# Patient Record
Sex: Female | Born: 1950
Health system: Southern US, Community
[De-identification: ages and names within clinical notes are randomized; demographics above are authoritative.]

## PROBLEM LIST (undated history)

## (undated) DIAGNOSIS — N189 Chronic kidney disease, unspecified: Secondary | ICD-10-CM

## (undated) DIAGNOSIS — D494 Neoplasm of unspecified behavior of bladder: Secondary | ICD-10-CM

## (undated) DIAGNOSIS — M47816 Spondylosis without myelopathy or radiculopathy, lumbar region: Secondary | ICD-10-CM

## (undated) DIAGNOSIS — Z87440 Personal history of urinary (tract) infections: Secondary | ICD-10-CM

## (undated) DIAGNOSIS — Z9889 Other specified postprocedural states: Secondary | ICD-10-CM

## (undated) DIAGNOSIS — Z923 Personal history of irradiation: Secondary | ICD-10-CM

## (undated) DIAGNOSIS — Z85828 Personal history of other malignant neoplasm of skin: Secondary | ICD-10-CM

## (undated) DIAGNOSIS — N3020 Other chronic cystitis without hematuria: Secondary | ICD-10-CM

## (undated) DIAGNOSIS — E890 Postprocedural hypothyroidism: Secondary | ICD-10-CM

## (undated) DIAGNOSIS — F3181 Bipolar II disorder: Secondary | ICD-10-CM

## (undated) DIAGNOSIS — Z87442 Personal history of urinary calculi: Secondary | ICD-10-CM

## (undated) HISTORY — PX: APPENDECTOMY: SHX54

## (undated) HISTORY — DX: Personal history of urinary (tract) infections: Z87.440

## (undated) HISTORY — DX: Spondylosis without myelopathy or radiculopathy, lumbar region: M47.816

## (undated) HISTORY — DX: Personal history of urinary calculi: Z87.442

## (undated) HISTORY — PX: JOINT REPLACEMENT: SHX530

---

## 1974-12-30 HISTORY — PX: TUBAL LIGATION: SHX77

## 2001-07-06 ENCOUNTER — Encounter (INDEPENDENT_AMBULATORY_CARE_PROVIDER_SITE_OTHER): Payer: Self-pay | Admitting: Specialist

## 2001-07-06 ENCOUNTER — Ambulatory Visit (HOSPITAL_BASED_OUTPATIENT_CLINIC_OR_DEPARTMENT_OTHER): Admission: RE | Admit: 2001-07-06 | Discharge: 2001-07-06 | Payer: Self-pay | Admitting: General Surgery

## 2003-10-24 ENCOUNTER — Encounter: Payer: Self-pay | Admitting: Radiology

## 2003-10-24 ENCOUNTER — Encounter: Admission: RE | Admit: 2003-10-24 | Discharge: 2003-10-24 | Payer: Self-pay

## 2003-11-09 ENCOUNTER — Encounter: Admission: RE | Admit: 2003-11-09 | Discharge: 2003-11-09 | Payer: Self-pay

## 2004-01-10 ENCOUNTER — Ambulatory Visit (HOSPITAL_COMMUNITY): Admission: RE | Admit: 2004-01-10 | Discharge: 2004-01-10 | Payer: Self-pay | Admitting: Neurosurgery

## 2008-03-02 ENCOUNTER — Inpatient Hospital Stay (HOSPITAL_COMMUNITY): Admission: EM | Admit: 2008-03-02 | Discharge: 2008-03-05 | Payer: Self-pay | Admitting: Emergency Medicine

## 2008-03-30 ENCOUNTER — Encounter: Admission: RE | Admit: 2008-03-30 | Discharge: 2008-03-30 | Payer: Self-pay | Admitting: Endocrinology

## 2008-05-30 ENCOUNTER — Ambulatory Visit (HOSPITAL_COMMUNITY): Admission: RE | Admit: 2008-05-30 | Discharge: 2008-05-30 | Payer: Self-pay | Admitting: Urology

## 2008-12-30 HISTORY — PX: EXTRACORPOREAL SHOCK WAVE LITHOTRIPSY: SHX1557

## 2010-04-26 ENCOUNTER — Encounter: Admission: RE | Admit: 2010-04-26 | Discharge: 2010-04-26 | Payer: Self-pay | Admitting: Endocrinology

## 2010-07-30 LAB — CONVERTED CEMR LAB: Pap Smear: NORMAL

## 2010-10-31 ENCOUNTER — Encounter: Payer: Self-pay | Admitting: Internal Medicine

## 2010-11-13 ENCOUNTER — Ambulatory Visit: Payer: Self-pay | Admitting: Internal Medicine

## 2010-11-13 DIAGNOSIS — E785 Hyperlipidemia, unspecified: Secondary | ICD-10-CM | POA: Insufficient documentation

## 2010-11-13 DIAGNOSIS — E669 Obesity, unspecified: Secondary | ICD-10-CM

## 2010-11-13 DIAGNOSIS — Z87442 Personal history of urinary calculi: Secondary | ICD-10-CM

## 2010-11-13 DIAGNOSIS — E039 Hypothyroidism, unspecified: Secondary | ICD-10-CM

## 2010-11-13 DIAGNOSIS — M1990 Unspecified osteoarthritis, unspecified site: Secondary | ICD-10-CM

## 2010-11-13 DIAGNOSIS — F4321 Adjustment disorder with depressed mood: Secondary | ICD-10-CM

## 2010-11-13 DIAGNOSIS — J309 Allergic rhinitis, unspecified: Secondary | ICD-10-CM | POA: Insufficient documentation

## 2010-11-13 DIAGNOSIS — N390 Urinary tract infection, site not specified: Secondary | ICD-10-CM

## 2010-11-26 ENCOUNTER — Encounter: Payer: Self-pay | Admitting: Internal Medicine

## 2011-01-20 ENCOUNTER — Encounter: Payer: Self-pay | Admitting: Endocrinology

## 2011-01-29 NOTE — Progress Notes (Signed)
Summary: Medication and Physician List provided by patient  Medication and Physician List provided by patient   Imported By: Maryln Gottron 11/20/2010 10:44:04  _____________________________________________________________________  External Attachment:    Type:   Image     Comment:   External Document

## 2011-01-29 NOTE — Letter (Signed)
Summary: Records from [facility] 2009 - 2011  Records from [facility] 2009 - 2011   Imported By: Maryln Gottron 12/05/2010 10:48:40  _____________________________________________________________________  External Attachment:    Type:   Image     Comment:   External Document

## 2011-01-29 NOTE — Letter (Signed)
Summary: PATIENT HX FORM  PATIENT RX FORM   Imported By: Georgian Co 11/26/2010 16:01:35  _____________________________________________________________________  External Attachment:    Type:   Image     Comment:   External Document

## 2011-01-29 NOTE — Assessment & Plan Note (Signed)
Summary: BRAND NEW PT/TO EST/CJR   Vital Signs:  Patient profile:   60 year old female Menstrual status:  postmenopausal Height:      62.25 inches Weight:      258 pounds BMI:     46.98 Pulse rate:   78 / minute BP sitting:   100 / 70  (left arm) Cuff size:   regular  Vitals Entered By: Romualdo Bolk, CMA (AAMA) (November 13, 2010 1:27 PM)  Nutrition Counseling: Patient's BMI is greater than 25 and therefore counseled on weight management options. CC: New Pt to get establish. Pt wants to discuss weight issue.     Menstrual Status postmenopausal Last PAP Result normal   History of Present Illness: Amber Gutierrez .comes in today   for new patient  visit  .  Her previous care  has bee Dr Juleen China.     she saw the practiitoner in his office  and she has since left.  She is most concerned about weight gain and  obesity .   She tends to mood eat  but  tobacco cessation and thyroid disease felt contribute. In early 90s  thyroid disease  and was r with Rai for thyroid polyps    and weight an issue since then. Stopped  tobacco     and gained 60 # .      Has reactive  mood   since husbands dx   and medicaiton has  helped a lot.   Recurrent UTI :    since childbirth.   Optometrist  said poss cataract Elevated lipids :ldl was 133   was put on pravastatin   and had se .  stopped after a month.  Sleep:  takes meds   as needed with husbands illness.   Preventive Care Screening  Mammogram:    Date:  07/30/2010    Results:  normal   Pap Smear:    Date:  07/30/2010    Results:  normal   Colonoscopy:    Date:  06/29/2010    Results:  normal   Last Tetanus Booster:    Date:  12/30/2004    Results:  Historical    Preventive Screening-Counseling & Management  Alcohol-Tobacco     Alcohol drinks/day: <1     Alcohol type: wine     Smoking Status: quit     Year Quit: 3 years ago  Caffeine-Diet-Exercise     Caffeine use/day: 2-3     Does Patient Exercise: yes     Type of  exercise: walking  Comments: since young    quit  2008  .        Drug Use:  no.    Current Medications (verified): 1)  Synthroid 112 Mcg Tabs (Levothyroxine Sodium) .Marland Kitchen.. 1 By Mouth Once Daily 2)  Wellbutrin Xl 300 Mg Xr24h-Tab (Bupropion Hcl) .Marland Kitchen.. 1 By Mouth Once Daily 3)  Trimethoprim 100 Mg Tabs (Trimethoprim) .Marland Kitchen.. 1 By Mouth Once Daily 4)  Vicodin 5-500 Mg Tabs (Hydrocodone-Acetaminophen) .Marland Kitchen.. 1-3 A Day As Needed 5)  Omnaris 50 Mcg/act Susp (Ciclesonide) .... Take As Needed 6)  Restoril 15 Mg Caps (Temazepam) .Marland Kitchen.. 1 By Mouth As Needed  Allergies (verified): 1)  ! Sulfa 2)  Pravastatin Sodium (Pravastatin Sodium)  Past History:  Past Medical History: Nephrolithiasis, hx of   lithotrypsy.   calcium stone.  thyroid disease  hx of  RAI  x 2   on brand . Allergic rhinitis  cats    djd spine  has seen dr Jeral Fruit    recurrent utis  G2 P2 Situational insomnia  Past Surgical History: Lithotripsy 2010 Tubal ligation C-Section x 2,  73 and 75   Radiation for thyroid polyps 1992 Colonscopy July 2011  Past History:  Care Management: Urology: Vonita Moss Gynecology: Hyacinth Meeker Gastroenterology: Loreta Ave Dermatology: Terri Piedra  Family History: Father: Deceased- Sub MI; alcohol  ism  age 60 Mother: Healthy  bipolar.  age 48 Siblings: Sister Youngest- Breast Ca; Sister- Middle- Gallbladder problems GM M colon cancer age 10 Mgm lived til 30 Pgf cancer age 58 Pgm: died age 81  2 sisters 2 children  Social History: Married husband relapsing.  and remitting..   he smokes usually not around her Former Smoker Alcohol use-yes-socially Drug use-no Regular exercise-yes self employed   computer  Originally from  [place] graduate  in women studies Smoking Status:  quit Caffeine use/day:  2-3 Does Patient Exercise:  yes Drug Use:  no  Review of Systems  The patient denies anorexia, fever, weight loss, hoarseness, chest pain, syncope, dyspnea on exertion, peripheral edema, prolonged  cough, abdominal pain, melena, hematochezia, severe indigestion/heartburn, hematuria, muscle weakness, difficulty walking, unusual weight change, abnormal bleeding, enlarged lymph nodes, and angioedema.         rest see HPI or negative . on 12 system review  Physical Exam  General:  Well-developed,well-nourished,in no acute distress; alert,appropriate and cooperative throughout examination Head:  normocephalic and atraumatic.   Eyes:  vision grossly intact, pupils equal, and pupils round.   Ears:  R ear normal, L ear normal, and no external deformities.   Nose:  no external deformity, no external erythema, and no nasal discharge.   Mouth:  pharynx pink and moist.   Neck:  No deformities, masses, or tenderness noted. Lungs:  Normal respiratory effort, chest expands symmetrically. Lungs are clear to auscultation, no crackles or wheezes.no dullness.   Heart:  Normal rate and regular rhythm. S1 and S2 normal without gallop, murmur, click, rub or other extra sounds. Abdomen:  Bowel sounds positive,abdomen soft and non-tender without masses, organomegaly or   noted. Msk:  no joint swelling and no joint warmth.   Pulses:  pulses intact without delay   Extremities:  no clubbing cyanosis or edema  Neurologic:  grossly non focal Skin:  turgor normal, color normal, and tattoo(s).   no striae Cervical Nodes:  No lymphadenopathy noted Psych:  Oriented X3, normally interactive, good eye contact, not anxious appearing, and not depressed appearing.   reviewed  summary from patient  Impression & Recommendations:  Problem # 1:  OBESITY (ICD-278.00) weight gain     mood eater  .   gyne had sugg  lap band .   counseled     addresss the mood eating first and agree with counseling  Problem # 2:  HYPERLIPIDEMIA (ICD-272.4) with hx of se of med   unclear risk beneft   Problem # 3:  OTHER POSTABLATIVE HYPOTHYROIDISM (ICD-244.1)  Her updated medication list for this problem includes:    Synthroid 112 Mcg  Tabs (Levothyroxine sodium) .Marland Kitchen... 1 by mouth once daily  Problem # 4:  ADJUSTMENT DISORDER WITH DEPRESSED MOOD (ICD-309.0) apparently stabe on current meds   Problem # 5:  ALLERGIC RHINITIS (ICD-477.9)  Her updated medication list for this problem includes:    Omnaris 50 Mcg/act Susp (Ciclesonide) .Marland Kitchen... Take as needed  Problem # 6:  NEPHROLITHIASIS, HX OF (ICD-V13.01) calcium stones by report  Complete Medication List: 1)  Synthroid 112 Mcg Tabs (Levothyroxine  sodium) .Marland Kitchen.. 1 by mouth once daily 2)  Wellbutrin Xl 300 Mg Xr24h-tab (Bupropion hcl) .Marland Kitchen.. 1 by mouth once daily 3)  Trimethoprim 100 Mg Tabs (Trimethoprim) .Marland Kitchen.. 1 by mouth once daily 4)  Vicodin 5-500 Mg Tabs (Hydrocodone-acetaminophen) .Marland Kitchen.. 1-3 a day as needed 5)  Omnaris 50 Mcg/act Susp (Ciclesonide) .... Take as needed 6)  Restoril 15 Mg Caps (Temazepam) .Marland Kitchen.. 1 by mouth as needed  Other Orders: Admin 1st Vaccine (95621) Flu Vaccine 4yrs + (30865)  Patient Instructions: 1)  agree with   counseling  about the emotional eating. 2)  will review labs when available .  3)  Check up in May 2012 .   Orders Added: 1)  Admin 1st Vaccine [90471] 2)  Flu Vaccine 49yrs + [78469] 3)  New Patient Level IV [62952]    Flu Vaccine Consent Questions     Do you have a history of severe allergic reactions to this vaccine? no    Any prior history of allergic reactions to egg and/or gelatin? no    Do you have a sensitivity to the preservative Thimersol? no    Do you have a past history of Guillan-Barre Syndrome? no    Do you currently have an acute febrile illness? no    Have you ever had a severe reaction to latex? no    Vaccine information given and explained to patient? yes    Are you currently pregnant? no    Lot Number:[id number]   Exp Date:06/29/2011   Site Given  Left Deltoid IM .lbflu Romualdo Bolk, CMA (AAMA)  November 13, 2010 1:37 PM  greater than 50% of visit spent in counseling .. prolonged visit  face to face    45 minutes .

## 2011-01-31 ENCOUNTER — Telehealth: Payer: Self-pay | Admitting: Internal Medicine

## 2011-01-31 NOTE — Telephone Encounter (Signed)
Spoke to pt and told her to have [facility] to fax the rx to [place].

## 2011-01-31 NOTE — Telephone Encounter (Signed)
Pt called and said that [facility] has been trying to contact Dr Fabian Sharp by fax re: getting pts meds refilled and have not rcvd any response. Pt is req that Vicodin be called in to [facility], all other other meds be sent in to [facility]. Pls call pt is there is any question.

## 2011-01-31 NOTE — Telephone Encounter (Signed)
As discussed we didn't get any  ? Call pharmacy or patient

## 2011-02-08 ENCOUNTER — Other Ambulatory Visit: Payer: Self-pay | Admitting: *Deleted

## 2011-02-10 NOTE — Telephone Encounter (Signed)
Ok to refill x 1  

## 2011-02-11 MED ORDER — HYDROCODONE-ACETAMINOPHEN 5-500 MG PO TABS: ORAL_TABLET | ORAL | Status: DC

## 2011-02-11 NOTE — Telephone Encounter (Signed)
Rx called in 

## 2011-04-10 ENCOUNTER — Telehealth: Payer: Self-pay | Admitting: *Deleted

## 2011-04-10 MED ORDER — BUPROPION HCL ER (XL) 300 MG PO TB24: 300.0000 mg | ORAL_TABLET | ORAL | Status: DC

## 2011-04-10 MED ORDER — LEVOTHYROXINE SODIUM 112 MCG PO TABS: 112.0000 ug | ORAL_TABLET | Freq: Every day | ORAL | Status: DC

## 2011-04-10 MED ORDER — CICLESONIDE 50 MCG/ACT NA SUSP: 2.0000 | Freq: Every day | NASAL | Status: DC

## 2011-04-10 NOTE — Telephone Encounter (Signed)
Rx sent to pharmacy   

## 2011-04-15 ENCOUNTER — Telehealth: Payer: Self-pay | Admitting: *Deleted

## 2011-04-15 MED ORDER — CICLESONIDE 50 MCG/ACT NA SUSP: 2.0000 | Freq: Every day | NASAL | Status: DC

## 2011-04-15 NOTE — Telephone Encounter (Signed)
rx sent to pharmacy

## 2011-04-19 ENCOUNTER — Telehealth: Payer: Self-pay | Admitting: *Deleted

## 2011-04-19 MED ORDER — FLUTICASONE FUROATE 27.5 MCG/SPRAY NA SUSP: 2.0000 | Freq: Every day | NASAL | Status: DC

## 2011-04-19 NOTE — Telephone Encounter (Signed)
[facility]- omnaris is on mft backorder. Can they change to veramyst . Per Dr. Fabian Sharp- ok to change her medication. rx sent to pharmacy.

## 2011-05-13 ENCOUNTER — Ambulatory Visit: Payer: Self-pay | Admitting: Internal Medicine

## 2011-05-14 NOTE — Discharge Summary (Signed)
NAMEBERANIA, PEEDIN NO.:  [id number]   MEDICAL RECORD NO.:  [id number]          PATIENT TYPE:  INP   LOCATION:  6736                         FACILITY:  [facility]   PHYSICIAN:  Lonia Blood, M.D.DATE OF BIRTH:  [birth year]   DATE OF ADMISSION:  03/02/2008  DATE OF DISCHARGE:  03/05/2008                               DISCHARGE SUMMARY   PRIMARY CARE PHYSICIAN:  Dr. Adela Lank.   DISCHARGE DIAGNOSES:  1. Left severe pyelonephritis - organism not definitively identified.  2. Sepsis secondary to above.  3. Hypothyroidism.  4. Obesity.  5. Tobacco abuse.   DISCHARGE MEDICATIONS:  1. Septra DS one p.o. b.i.d. x5 additional days, then stop.  2. Synthroid 88 mcg daily.  3. Wellbutrin 300 mg daily.   FOLLOWUP:  The patient is advised to follow Dr. Juleen China in 7 to 10 days  for routine re-evaluation.  She is advised that a repeat TSH would be  recommended within the next month and that consideration be given to  adjusting her Synthroid regimen.   CONSULTATIONS:  None.   PROCEDURE:  None.   HOSPITAL COURSE:  Ms. Amber Gutierrez is a very pleasant 60 year old  female who was admitted to hospital on March 02, 2008, after she  presented with a 24-hour history of shaking fevers, chills, left-sided  severe back pain, sweats, nausea and vomiting associated with dysuria.  She had failed outpatient treatment with a dose of Levaquin, but it  failed to improve her symptoms.  Upon presentation to the emergency  room, she was found to be suffering with an entirely sepsis with a blood  of 80/58 and a heart rate of 105.  The patient was admitted to the acute  units.  She was placed on empiric and broad-spectrum IV antibiotic  therapy.  She was aggressively volume repleted intravenously.  The  patient responded well to her volume expansion.  Her vital signs  stabilized.  Blood pressure improved back to 120/90, at the time of her  discharge.  White count, which peaked at  15.5, recent nadir of 8.1.  The  patient did suffer with some mild hypokalemia during her admission,  which was felt to be due to decreased p.o. intake prior to her  admission.  With simple replacement, this resolved.  TSH was obtained  and was found to be 0.3.  The patient states that her dosage of  Synthroid at her regimen had been recently altered.  We therefore  decided made no further changes and instructed her to follow up with her  primary care physician for re-evaluation of this.  Urinalysis was  obtained and did confirm a pyelonephritis.  A urine culture was obtained  and only revealed 40,000 lactobacillus.  No other organism was able to  be identified.   On March 05, 2008, the patient was stable on her feet.  Blood pressure  had normalized.  She was eating a regular diet without difficulty.  She  was cleared for discharge home with a transition to p.o. antibiotics and  followup as noted above.      Lonia Blood,  M.D.  Electronically Signed     JTM/MEDQ  D:  03/05/2008  T:  03/07/2008  Job:  [id number]   cc:   Brooke Bonito, M.D.

## 2011-05-14 NOTE — H&P (Signed)
NAME:  Amber Gutierrez, Amber Gutierrez NO.:  [id number]   MEDICAL RECORD NO.:  [id number]          PATIENT TYPE:  INP   LOCATION:  1828                         FACILITY:  [facility]   PHYSICIAN:  Lonia Blood, M.D.       DATE OF BIRTH:  [birth year]   DATE OF ADMISSION:  03/02/2008  DATE OF DISCHARGE:                              HISTORY & PHYSICAL   PRIMARY CARE PHYSICIAN:  Brooke Bonito, M.D.   CHIEF COMPLAINT:  Fever.   HISTORY OF PRESENT ILLNESS:  The patient is a 60 year old woman without  any significant past medical history who presents today with 24 hours of  significant rigors, fever, chills, back pain, sweats, nausea, vomiting,  and dysuria.  She went to her primary care physician today for all of  these complaints and was given Levaquin.  The patient continued to feel  sick and presented to the emergency room today.   PAST MEDICAL HISTORY:  Hypothyroidism and cesarean section.   HOME MEDICATIONS:  Synthroid and Levaquin.   FAMILY HISTORY:  Mother alive at age 64 and healthy.  Father died at age  72 with MI.  Two sisters alive and healthy.   SOCIAL HISTORY:  Two children and lives with husband.  Smokes a pack of  cigarettes a day.  Does not drink alcohol.  Works as a Emergency planning/management officer.   ALLERGIES:  No known drug allergies.   REVIEW OF SYSTEMS:  As per HPI.  All other systems negative.   PHYSICAL EXAMINATION:  VITAL SIGNS:  Temperature 103.7, pulse 106,  respirations 19, blood pressure 88/58.  GENERAL:  Lethargic, drenched in sweat.  Oriented to place, person, and  time.  HEENT:  Head normocephalic and atraumatic.  Eyes; pupils equal, round,  and reactive to light and accommodation.  Extraocular movements intact.  Throat clear.  NECK:  Supple with no JVD.  CHEST:  Clear without wheeze, rhonchi, or crackles.  HEART:  Regular rate and rhythm without murmurs, rubs, or gallops.  ABDOMEN:  Soft, nontender, and nondistended.  Bowel sounds present.  GENITOURINARY:  Left CVA  tenderness.  SKIN:  Warm and dry.  NEUROLOGY:  Nonfocal.   LABORATORY DATA:  UA; positive white blood cells, leukocyte esterase.  White blood cell count 15,000, potassium 3.4, PCO2 3.7, sodium 134,  chloride 105, BUN 11, creatinine 1.1, hemoglobin 14.8, platelet count  133.   ASSESSMENT:  Sepsis, most likely urinary source.   PLAN:  The patient will be admitted to stepdown unit.  She will have  fluid resuscitation, blood culture, urine culture, and then empiric  antibiotics with Rocephin and Vancomycin.  The rationale for the  antibiotic is to cover gram negative rods as well as Enterococcus.  Close follow-up of vital signs and CBC and BMET will be done.  If the  patient does not improve within 24-48 hours, we will get CT scan of  abdomen to r/o ureteral obstruction.      Lonia Blood, M.D.  Electronically Signed     SL/MEDQ  D:  03/02/2008  T:  03/03/2008  Job:  [id number]   cc:  Brooke Bonito, M.D.

## 2011-05-16 ENCOUNTER — Encounter: Payer: Self-pay | Admitting: Internal Medicine

## 2011-05-17 ENCOUNTER — Encounter: Payer: Self-pay | Admitting: Internal Medicine

## 2011-05-17 ENCOUNTER — Ambulatory Visit (INDEPENDENT_AMBULATORY_CARE_PROVIDER_SITE_OTHER): Payer: 59 | Admitting: Internal Medicine

## 2011-05-17 VITALS — BP 130/80 | HR 66 | Wt 259.0 lb

## 2011-05-17 DIAGNOSIS — E785 Hyperlipidemia, unspecified: Secondary | ICD-10-CM

## 2011-05-17 DIAGNOSIS — Z87442 Personal history of urinary calculi: Secondary | ICD-10-CM

## 2011-05-17 DIAGNOSIS — T148XXA Other injury of unspecified body region, initial encounter: Secondary | ICD-10-CM

## 2011-05-17 DIAGNOSIS — E890 Postprocedural hypothyroidism: Secondary | ICD-10-CM

## 2011-05-17 DIAGNOSIS — E669 Obesity, unspecified: Secondary | ICD-10-CM

## 2011-05-17 DIAGNOSIS — T1490XA Injury, unspecified, initial encounter: Secondary | ICD-10-CM

## 2011-05-17 DIAGNOSIS — J309 Allergic rhinitis, unspecified: Secondary | ICD-10-CM

## 2011-05-17 DIAGNOSIS — M1990 Unspecified osteoarthritis, unspecified site: Secondary | ICD-10-CM

## 2011-05-17 LAB — LIPID PANEL: LDL Cholesterol: 122 mg/dL — ABNORMAL HIGH (ref 0–99)

## 2011-05-17 LAB — CBC WITH DIFFERENTIAL/PLATELET
HCT: 46.4 % — ABNORMAL HIGH (ref 36.0–46.0)
Hemoglobin: 16.1 g/dL — ABNORMAL HIGH (ref 12.0–15.0)
Lymphocytes Relative: 18.4 % (ref 12.0–46.0)
Lymphs Abs: 1.2 10*3/uL (ref 0.7–4.0)
MCHC: 34.8 g/dL (ref 30.0–36.0)
Monocytes Relative: 5.3 % (ref 3.0–12.0)
Neutrophils Relative %: 73.5 % (ref 43.0–77.0)

## 2011-05-17 LAB — BASIC METABOLIC PANEL
BUN: 13 mg/dL (ref 6–23)
Chloride: 103 mEq/L (ref 96–112)
Creatinine, Ser: 0.9 mg/dL (ref 0.4–1.2)
Glucose, Bld: 81 mg/dL (ref 70–99)
Potassium: 4.7 mEq/L (ref 3.5–5.1)

## 2011-05-17 LAB — HEPATIC FUNCTION PANEL
AST: 20 U/L (ref 0–37)
Alkaline Phosphatase: 105 U/L (ref 39–117)

## 2011-05-17 LAB — TSH: TSH: 2.47 u[IU]/mL (ref 0.35–5.50)

## 2011-05-17 MED ORDER — HYDROCODONE-ACETAMINOPHEN 5-500 MG PO TABS: ORAL_TABLET | ORAL | Status: DC

## 2011-05-17 MED ORDER — DOXYCYCLINE HYCLATE 100 MG PO TABS: 100.0000 mg | ORAL_TABLET | Freq: Two times a day (BID) | ORAL | Status: AC

## 2011-05-17 MED ORDER — DOXYCYCLINE HYCLATE 100 MG PO TABS: 100.0000 mg | ORAL_TABLET | Freq: Two times a day (BID) | ORAL | Status: DC

## 2011-05-17 NOTE — Progress Notes (Signed)
Subjective:    Patient ID: Amber Gutierrez, female    DOB: 10-23-1951, 60 y.o.   MRN: [id number]  HPI  Pt comes in for fu of  multiple medical issues  Since last visit she has spent 3 months in [place] and got back in [place] Had recent UTIS  rx with cipro x 2 and off for a week. Accidentally had garden took fall and puncture right leg area below knee 7 days ago . Cleaned it and later got something out of it and now getting better  But is firm and tender a bit . No discharge . NO fever and tdap utd. Thryoid : no change on brand neeeds checked. Mood : dx with prob bipolar disease and doing much better in sleep and mood.  Now attacking the weight issue and to hold off on bariatric surgery. Still tobaco free. No cp sob change exept with the weight gain.  ? No OSA sx.   Review of Systems See hpi no bleeding bruising. Unusual has vision change  Gets neck pain prn uses hydrocodone   Last used refill in February.  Past Medical History  Diagnosis Date  . History of nephrolithiasis     lithotrypsy calcium stone  . Thyroid disease     hx of RAI x 2 on brand  . Allergic rhinitis     cats  . DJD (degenerative joint disease) of lumbar spine     has seen Dr. Jeral Fruit  . History of recurrent UTIs   . Situational insomnia    Past Surgical History  Procedure Date  . Lithotripsy 2010  . Tubal ligation   . Cesarean section     x 2 73 and 75  . Radiation for thyroid polyps     reports that she has quit smoking. She does not have any smokeless tobacco history on file. She reports that she drinks alcohol. She reports that she does not use illicit drugs. family history includes Alcohol abuse in her father; Bipolar disorder in her mothers; Breast cancer in her sister; Gallbladder disease in her sister; and Heart attack in her father. Allergies  Allergen Reactions  . Pravastatin Sodium     REACTION: Muscle pain and weakness  . Sulfonamide Derivatives     REACTION: Hives       Objective:   Physical Exam WDWN in nad HEENT: Normocephalic ;atraumatic , Eyes;  PERRL, EOMs  Full, lids and conjunctiva clear,,Ears: no deformities, canals nl, TM landmarks normal, Nose: no deformity or discharge  Mouth : OP clear without lesion or edema . Neck no masses Chest:  Clear to A&P without wheezes rales or rhonchi CV:  S1-S2 no gallops or murmurs peripheral perfusion is normal Abdomen:  Sof,t normal bowel sounds without hepatosplenomegaly, no guarding rebound or masses no CVA tenderness  extr  No edema per se   About 8 mmredness area with central healing area over tibial tuberosity on right . Non tender no discharge . Firm not fluctuant. Neuro non focal  Neuro grossly nl. Psych oriented  Good eye contact . Oriented x 3 and no noted deficits in memory, attention, and speech.        Assessment & Plan:  Hypothyroid  On meds LIPIDS needs check Obesity  Disc strategies ls Bipolar under rx doing better  DJD  Neck pain  Requesting prn hydrocodone   orig given by Dr Jeral Fruit  Risk benefit of medication discussed.   Refill x 1 minimize use for chronic pain. Recurrent uti  Recent rx by uri Puncture wound  Local care and can add antibiotic if needed over the weekend.   Total visit > 50% spent counseling and coordinating care    Labs pending and plan other follow up.

## 2011-05-17 NOTE — Op Note (Signed)
[place]  Patient:    Amber Gutierrez, Amber Gutierrez                     MRN: [id number] Proc. Date: 07/06/01 Adm. Date:  [id number] Attending:  Brandy Hale CC:         Bernadene Person, M.D.   Operative Report  PREOPERATIVE DIAGNOSIS:  Infected cyst, right breast skin.  POSTOPERATIVE DIAGNOSIS:  Infected cyst, right breast skin.  OPERATION PERFORMED:  Excision of right breast cyst.  SURGEON:  Angelia Mould. Derrell Lolling, M.D.  ANESTHESIA:  INDICATIONS FOR PROCEDURE:  The patient is a 60 year old white female who has had a cystic mass of the right breast skin for a few years.  This had been incised and drained a couple of times.  It continues to intermittently drain foul-smelling cheesy material and she would like this area excised.  Exam reveals on the right breast skin superiorly and medially, a small sebaceous cyst with a dimple in the skin, not actively inflamed at this time.  This appears to be about 8 mm in diameter.  DESCRIPTION OF PROCEDURE:  The patient was brought to [facility] to the minor procedure room.  She was placed supine on the operating table and the right breast was prepped and draped in a sterile fashion.  1% Xylocaine with epinephrine was used as local infiltration anesthetic.  A transversely oriented elliptical incision approximately 1 cm long was made. Dissection was carried down through the skin into the deep subcutaneous tissue and down to the breast tissue where a chronically inflamed cyst was completed excised.  This was sent for histologic exam.  Hemostasis was excellent.  The wound was irrigated with saline.  The skin was closed with interrupted subcuticular sutures of 4-0 Vicryl and Steri-Strips.  Clean bandages were placed and the patient was returned to the waiting area in excellent condition.  Estimated blood loss was about 3 cc.  Complications were none. Sponge, needle and instrument counts were  correct. DD:  07/06/01 TD:  07/06/01 Job: [id number]

## 2011-05-17 NOTE — Patient Instructions (Signed)
Will notify you  of labs when available. Continue lifestyle intervention healthy eating and exercise .  Take the antibiotic if  Increasing redness  Or getting worse. Otherwise  Compresses and topical antibiotic. FU depending on labs .

## 2011-05-29 ENCOUNTER — Encounter: Payer: Self-pay | Admitting: *Deleted

## 2011-09-23 LAB — CBC
HCT: 34.3 — ABNORMAL LOW
HCT: 41.8
Hemoglobin: 14.8
MCHC: 34.6
MCHC: 34.8
MCHC: 35.3
MCV: 85.9
MCV: 86.4
Platelets: 116 — ABNORMAL LOW
RBC: 4.06
RBC: 4.43
RBC: 4.87
RDW: 12.9
WBC: 15.7 — ABNORMAL HIGH
WBC: 8.1

## 2011-09-23 LAB — URINE CULTURE: Colony Count: 40000

## 2011-09-23 LAB — BASIC METABOLIC PANEL
BUN: 11
CO2: 21
CO2: 23
Calcium: 8.5
Chloride: 103
Creatinine, Ser: 0.78
Creatinine, Ser: 0.98
GFR calc Af Amer: >60
GFR calc Af Amer: >60
Glucose, Bld: 96

## 2011-09-23 LAB — DIFFERENTIAL
Basophils Absolute: 0
Lymphocytes Relative: 2 — ABNORMAL LOW
Neutrophils Relative %: 91 — ABNORMAL HIGH

## 2011-09-23 LAB — I-STAT 8, (EC8 V) (CONVERTED LAB)
BUN: 11
Bicarbonate: 21.4
Glucose, Bld: 92
Operator id: 234501
TCO2: 22
pH, Ven: 7.504 — ABNORMAL HIGH

## 2011-09-23 LAB — URINE MICROSCOPIC-ADD ON

## 2011-09-23 LAB — CULTURE, BLOOD (ROUTINE X 2): Culture: NO GROWTH

## 2011-09-23 LAB — URINALYSIS, ROUTINE W REFLEX MICROSCOPIC
Bilirubin Urine: NEGATIVE
Glucose, UA: NEGATIVE
Ketones, ur: 15 — AB
Nitrite: NEGATIVE
Protein, ur: 100 — AB
Specific Gravity, Urine: 1.012
Urobilinogen, UA: 1

## 2011-09-23 LAB — POTASSIUM: Potassium: 4.2

## 2011-09-23 LAB — POCT I-STAT CREATININE: Operator id: 234501

## 2011-09-23 LAB — TSH: TSH: 0.302 — ABNORMAL LOW

## 2011-09-26 LAB — PREGNANCY, URINE: Preg Test, Ur: NEGATIVE

## 2012-04-27 ENCOUNTER — Other Ambulatory Visit: Payer: Self-pay | Admitting: Internal Medicine

## 2012-07-10 ENCOUNTER — Encounter (INDEPENDENT_AMBULATORY_CARE_PROVIDER_SITE_OTHER): Payer: Self-pay | Admitting: General Surgery

## 2012-07-10 ENCOUNTER — Ambulatory Visit (INDEPENDENT_AMBULATORY_CARE_PROVIDER_SITE_OTHER): Payer: 59 | Admitting: General Surgery

## 2012-07-10 VITALS — BP 144/92 | HR 64 | Temp 97.0°F | Resp 16 | Ht 62.0 in | Wt 237.6 lb

## 2012-07-10 DIAGNOSIS — L723 Sebaceous cyst: Secondary | ICD-10-CM

## 2012-07-10 DIAGNOSIS — K8020 Calculus of gallbladder without cholecystitis without obstruction: Secondary | ICD-10-CM

## 2012-07-10 NOTE — Patient Instructions (Signed)
Call the office next week to schedule your surgery [phone]

## 2012-07-10 NOTE — Progress Notes (Signed)
Patient ID: Amber Gutierrez, female   DOB: [birth year], 61 y.o.   MRN: [id number]  Chief Complaint  Patient presents with  . Abdominal Pain    est pt- eval GB     HPI Amber Gutierrez is a 61 y.o. female.   HPI 61 year old Caucasian female referred by Jetta Lout, NP-C, at [facility] for evaluation for possible cholecystectomy. The patient states that she's had a several year history of intermittent episodes of epigastric discomfort. She describes it as a burning sensation after she eats. It is located in her epigastric area & radiates to her right upper quadrant. There has been some occasional nausea as well. She also describes a pressure sensation in her right upper quadrant as well. She denies any fevers or chills. She states that sometimes her stool is light colored. She denies any weight loss, diarrhea, constipation, melena, hematochezia, heavy NSAID use, or reflux. She does endorse some bleeding in her epigastric area. She states that all the women in her family have had their gallbladders removed. She also complains of an upper back cyst. She states that it's been there for several years however over the past month it has cause her more discomfort. She denies any drainage or redness of the area. She denies any trauma to that area. She has had a similar cyst removed in the past from her chest wall. Past Medical History  Diagnosis Date  . History of nephrolithiasis     lithotrypsy calcium stone  . Thyroid disease     hx of RAI x 2 on brand  . Allergic rhinitis     cats  . DJD (degenerative joint disease) of lumbar spine     has seen Dr. Jeral Fruit  . History of recurrent UTIs   . Situational insomnia     Past Surgical History  Procedure Date  . Lithotripsy 2010  . Tubal ligation   . Cesarean section     x 2 73 and 75  . Radiation for thyroid polyps     Family History  Problem Relation Age of Onset  . Heart attack Father     48  deceased  . Alcohol abuse Father   . Breast  cancer Sister   . Gallbladder disease Sister   . Bipolar disorder Mother   . Cancer Maternal Grandmother     colon    Social History History  Substance Use Topics  . Smoking status: Former Games developer  . Smokeless tobacco: Not on file  . Alcohol Use: Yes     socially    Allergies  Allergen Reactions  . Pravastatin Sodium     REACTION: Muscle pain and weakness  . Sulfonamide Derivatives     REACTION: Hives    Current Outpatient Prescriptions  Medication Sig Dispense Refill  . buPROPion (WELLBUTRIN XL) 300 MG 24 hr tablet Take 300 mg by mouth daily.        . Cholecalciferol (VITAMIN D) 2000 UNITS CAPS Take by mouth.        . docusate sodium (COLACE) 100 MG capsule Take 100 mg by mouth 2 (two) times daily.        Marland Kitchen lamoTRIgine (LAMICTAL) 200 MG tablet Take 200 mg by mouth daily.        . Lutein 10 MG TABS Take by mouth.        . MULTIPLE VITAMIN PO Take by mouth.        . Probiotic Product (PROBIOTIC PO) Take by mouth.        Marland Kitchen  SYNTHROID 112 MCG tablet TAKE 1 TABLET DAILY  90 tablet  3  . trimethoprim (TRIMPEX) 100 MG tablet Take 100 mg by mouth daily.          Review of Systems Review of Systems  Constitutional: Negative for activity change, appetite change and unexpected weight change.  HENT: Negative for neck pain.   Eyes: Negative for photophobia and visual disturbance.  Respiratory: Negative for chest tightness and shortness of breath.   Cardiovascular: Negative for chest pain, palpitations and leg swelling.       Denies CP, SOB, DOE, orthopnea, PND  Gastrointestinal:       See hpi  Genitourinary: Negative for dysuria and difficulty urinating.  Musculoskeletal: Negative for gait problem.       OA pains  Skin:       See hpi  Neurological: Negative for tremors, seizures, weakness and light-headedness.  Psychiatric/Behavioral:       Bipolar    Blood pressure 144/92, pulse 64, temperature 97 F (36.1 C), temperature source Temporal, resp. rate 16, height 5\' 2"   (1.575 m), weight 237 lb 9.6 oz (107.775 kg).  Physical Exam Physical Exam  Vitals reviewed. Constitutional: She is oriented to person, place, and time. She appears well-developed and well-nourished. No distress.       obese  HENT:  Head: Normocephalic and atraumatic.  Right Ear: External ear normal.  Left Ear: External ear normal.  Eyes: Conjunctivae are normal. No scleral icterus.  Neck: Normal range of motion. Neck supple. No tracheal deviation present.  Cardiovascular: Normal rate, regular rhythm and normal heart sounds.   Pulmonary/Chest: Effort normal and breath sounds normal. No respiratory distress. She has no wheezes.  Abdominal: Soft. Bowel sounds are normal. She exhibits no distension. There is no tenderness.    Musculoskeletal: She exhibits no edema and no tenderness.  Neurological: She is alert and oriented to person, place, and time. She exhibits normal muscle tone.  Skin: Skin is warm and dry. No rash noted. She is not diaphoretic. No erythema.          Left upper back well circumscribed, slightly firm, subcu lesion, 2 x 2cm, with punctum, no skin changes  Psychiatric: She has a normal mood and affect. Her behavior is normal. Judgment and thought content normal.    Data Reviewed [facility] CT abd/pelv report 03/2010 - gallstones, b/l non-obstructing renal stones [facility] referral note from 05/2012   Assessment    Symptomatic cholelithiasis Upper back subcutaneous mass c/w sebaceous cyst    Plan    I believe the patient's symptoms are consistent with gallbladder disease.  We discussed gallbladder disease. The patient was given Agricultural engineer. We discussed non-operative and operative management. We discussed the signs & symptoms of acute cholecystitis  I discussed laparoscopic cholecystectomy with IOC in detail.  The patient was given educational material as well as diagrams detailing the procedure.  We discussed the risks and benefits of a  laparoscopic cholecystectomy including, but not limited to bleeding, infection, injury to surrounding structures such as the intestine or liver, bile leak, retained gallstones, need to convert to an open procedure, prolonged diarrhea, blood clots such as  DVT, common bile duct injury, anesthesia risks, and possible need for additional procedures.  We discussed the typical post-operative recovery course. I explained that the likelihood of improvement of their symptoms is good.  I explained that we could also remove the sebaceous cyst at the same time as her surgery. We discussed the risk and benefits of  this procedure including but not limited to bleeding, infection, scarring, seroma versus hematoma formation, cyst recurrence.  She has elected to proceed with both procedures. She is going to contact the office early next week to schedule her surgeries.  Mary Sella. Andrey Campanile, MD, FACS General, Bariatric, & Minimally Invasive Surgery [facility], [place]         [facility] M 07/10/2012, 12:08 PM

## 2012-07-24 ENCOUNTER — Encounter (INDEPENDENT_AMBULATORY_CARE_PROVIDER_SITE_OTHER): Payer: Self-pay

## 2012-08-05 ENCOUNTER — Encounter (HOSPITAL_COMMUNITY): Payer: Self-pay | Admitting: Pharmacist

## 2012-08-14 ENCOUNTER — Encounter (HOSPITAL_COMMUNITY)
Admission: RE | Admit: 2012-08-14 | Discharge: 2012-08-14 | Disposition: A | Discharge status: Home / Self Care | Payer: 59 | Source: Ambulatory Visit | Attending: General Surgery | Admitting: General Surgery

## 2012-08-14 ENCOUNTER — Encounter (HOSPITAL_COMMUNITY): Payer: Self-pay

## 2012-08-14 HISTORY — DX: Bipolar II disorder: F31.81

## 2012-08-14 LAB — COMPREHENSIVE METABOLIC PANEL
ALT: 16 U/L (ref 0–35)
Alkaline Phosphatase: 125 U/L — ABNORMAL HIGH (ref 39–117)
CO2: 28 mEq/L (ref 19–32)
Calcium: 9.7 mg/dL (ref 8.4–10.5)
GFR calc Af Amer: 90 mL/min — ABNORMAL LOW (ref >90)
GFR calc non Af Amer: 77 mL/min — ABNORMAL LOW (ref >90)
Glucose, Bld: 80 mg/dL (ref 70–99)
Sodium: 140 mEq/L (ref 135–145)

## 2012-08-14 LAB — DIFFERENTIAL
Basophils Absolute: 0.1 10*3/uL (ref 0.0–0.1)
Eosinophils Absolute: 0.2 10*3/uL (ref 0.0–0.7)
Eosinophils Relative: 3 % (ref 0–5)
Lymphocytes Relative: 26 % (ref 12–46)

## 2012-08-14 LAB — CBC
Hemoglobin: 16.2 g/dL — ABNORMAL HIGH (ref 12.0–15.0)
MCH: 30.5 pg (ref 26.0–34.0)
MCV: 86.6 fL (ref 78.0–100.0)
RBC: 5.31 MIL/uL — ABNORMAL HIGH (ref 3.87–5.11)

## 2012-08-14 LAB — SURGICAL PCR SCREEN: MRSA, PCR: NEGATIVE

## 2012-08-14 NOTE — Pre-Procedure Instructions (Signed)
20 Amber Gutierrez  08/14/2012   Your procedure is scheduled on:  Thursday August 20, 2012.  Report to [facility] at 0930 AM.  Call this number if you have problems the morning of surgery: [phone]   Remember:   Do not eat food or drink:After Midnight.    Take these medicines the morning of surgery with A SIP OF WATER: Bupropion (Wellbutrin), Lamotrigine (Lamictal), Levothyroxine (Synthroid)  Please do not take any herbal medications or NSAIDS the morning of surgery   Do not wear jewelry.  Do not wear lotions, powders, or perfumes.   Do not shave 48 hours prior to surgery.   Do not bring valuables to the hospital.  Contacts, dentures or bridgework may not be worn into surgery.  Leave suitcase in the car. After surgery it may be brought to your room.  For patients admitted to the hospital, checkout time is 11:00 AM the day of discharge.   Patients discharged the day of surgery will not be allowed to drive home.  Name and phone number of your driver:   Special Instructions: CHG Shower Use Special Wash: 1/2 bottle night before surgery and 1/2 bottle morning of surgery.   Please read over the following fact sheets that you were given: Pain Booklet, Coughing and Deep Breathing, MRSA Information and Surgical Site Infection Prevention

## 2012-08-14 NOTE — Progress Notes (Signed)
Patient informed Nurse that she had a stress test at [facility] with Dr. Amaryllis Dyke several years ago. Nurse faxed a request to office; office # [phone] fax # [phone]. Patient denied having a cardiac cath or sleep study. Currently patient sees Dr. Fabian Sharp at [place].

## 2012-08-14 NOTE — Progress Notes (Signed)
Lisa office staff from [facility] called and stated that there was no stress test available, the last EKG was in 1999 and Dr. Amaryllis Dyke no longer worked at [facility].

## 2012-08-19 MED ORDER — DEXTROSE 5 % IV SOLN
2.0000 g | INTRAVENOUS | Status: DC
  Filled 2012-08-19: qty 2

## 2012-08-20 ENCOUNTER — Ambulatory Visit (HOSPITAL_COMMUNITY): Payer: 59

## 2012-08-20 ENCOUNTER — Encounter (HOSPITAL_COMMUNITY): Payer: Self-pay | Admitting: Anesthesiology

## 2012-08-20 ENCOUNTER — Ambulatory Visit (HOSPITAL_COMMUNITY)
Admission: RE | Admit: 2012-08-20 | Discharge: 2012-08-20 | Disposition: A | Discharge status: Home / Self Care | Payer: 59 | Source: Ambulatory Visit | Attending: General Surgery | Admitting: General Surgery

## 2012-08-20 ENCOUNTER — Encounter (HOSPITAL_COMMUNITY)
Admission: RE | Disposition: A | Discharge status: Home / Self Care | Payer: Self-pay | Source: Ambulatory Visit | Attending: General Surgery

## 2012-08-20 ENCOUNTER — Ambulatory Visit (HOSPITAL_COMMUNITY): Payer: 59 | Admitting: Anesthesiology

## 2012-08-20 ENCOUNTER — Encounter (HOSPITAL_COMMUNITY): Payer: Self-pay | Admitting: *Deleted

## 2012-08-20 DIAGNOSIS — K8010 Calculus of gallbladder with chronic cholecystitis without obstruction: Secondary | ICD-10-CM

## 2012-08-20 DIAGNOSIS — Z01818 Encounter for other preprocedural examination: Secondary | ICD-10-CM | POA: Insufficient documentation

## 2012-08-20 DIAGNOSIS — Z01812 Encounter for preprocedural laboratory examination: Secondary | ICD-10-CM | POA: Insufficient documentation

## 2012-08-20 DIAGNOSIS — L723 Sebaceous cyst: Secondary | ICD-10-CM

## 2012-08-20 DIAGNOSIS — K8020 Calculus of gallbladder without cholecystitis without obstruction: Secondary | ICD-10-CM | POA: Insufficient documentation

## 2012-08-20 HISTORY — PX: MASS EXCISION: SHX2000

## 2012-08-20 HISTORY — PX: CHOLECYSTECTOMY: SHX55

## 2012-08-20 SURGERY — LAPAROSCOPIC CHOLECYSTECTOMY WITH INTRAOPERATIVE CHOLANGIOGRAM
Anesthesia: General | Site: Back | Wound class: Clean Contaminated

## 2012-08-20 MED ORDER — HYDROMORPHONE HCL PF 1 MG/ML IJ SOLN
INTRAMUSCULAR | Status: AC
  Filled 2012-08-20: qty 1

## 2012-08-20 MED ORDER — DEXAMETHASONE SODIUM PHOSPHATE 4 MG/ML IJ SOLN
INTRAMUSCULAR | Status: DC | PRN
  Administered 2012-08-20: 4 mg via INTRAVENOUS

## 2012-08-20 MED ORDER — LIDOCAINE HCL (CARDIAC) 20 MG/ML IV SOLN
INTRAVENOUS | Status: DC | PRN
  Administered 2012-08-20: 20 mg via INTRAVENOUS

## 2012-08-20 MED ORDER — HYDROMORPHONE HCL PF 1 MG/ML IJ SOLN
0.2500 mg | INTRAMUSCULAR | Status: DC | PRN
  Administered 2012-08-20 (×3): 0.5 mg via INTRAVENOUS

## 2012-08-20 MED ORDER — FENTANYL CITRATE 0.05 MG/ML IJ SOLN
INTRAMUSCULAR | Status: DC | PRN
  Administered 2012-08-20: 50 ug via INTRAVENOUS
  Administered 2012-08-20: 250 ug via INTRAVENOUS

## 2012-08-20 MED ORDER — LIDOCAINE HCL (PF) 1 % IJ SOLN
INTRAMUSCULAR | Status: AC
  Filled 2012-08-20: qty 30

## 2012-08-20 MED ORDER — NEOSTIGMINE METHYLSULFATE 1 MG/ML IJ SOLN
INTRAMUSCULAR | Status: DC | PRN
  Administered 2012-08-20: 3 mg via INTRAVENOUS

## 2012-08-20 MED ORDER — MORPHINE SULFATE 2 MG/ML IJ SOLN: 1.0000 mg | INTRAMUSCULAR | Status: DC | PRN

## 2012-08-20 MED ORDER — 0.9 % SODIUM CHLORIDE (POUR BTL) OPTIME
TOPICAL | Status: DC | PRN
  Administered 2012-08-20: 1000 mL

## 2012-08-20 MED ORDER — DEXTROSE 5 % IV SOLN
2.0000 g | INTRAVENOUS | Status: DC | PRN
  Administered 2012-08-20: 2 g via INTRAVENOUS

## 2012-08-20 MED ORDER — OXYCODONE HCL 5 MG PO TABS: 5.0000 mg | ORAL_TABLET | ORAL | Status: DC | PRN

## 2012-08-20 MED ORDER — SODIUM CHLORIDE 0.9 % IR SOLN
Status: DC | PRN
  Administered 2012-08-20: 1000 mL

## 2012-08-20 MED ORDER — MIDAZOLAM HCL 5 MG/5ML IJ SOLN
INTRAMUSCULAR | Status: DC | PRN
  Administered 2012-08-20: 2 mg via INTRAVENOUS

## 2012-08-20 MED ORDER — BUPIVACAINE-EPINEPHRINE 0.25% -1:200000 IJ SOLN
INTRAMUSCULAR | Status: DC | PRN
  Administered 2012-08-20: 30 mL

## 2012-08-20 MED ORDER — HEMOSTATIC AGENTS (NO CHARGE) OPTIME
TOPICAL | Status: DC | PRN
  Administered 2012-08-20: 1 via TOPICAL

## 2012-08-20 MED ORDER — ONDANSETRON HCL 4 MG/2ML IJ SOLN
INTRAMUSCULAR | Status: DC | PRN
  Administered 2012-08-20: 4 mg via INTRAVENOUS

## 2012-08-20 MED ORDER — KCL IN DEXTROSE-NACL 20-5-0.45 MEQ/L-%-% IV SOLN: INTRAVENOUS | Status: DC

## 2012-08-20 MED ORDER — OXYCODONE-ACETAMINOPHEN 5-325 MG PO TABS: 1.0000 | ORAL_TABLET | ORAL | Status: AC | PRN

## 2012-08-20 MED ORDER — BUPIVACAINE-EPINEPHRINE PF 0.25-1:200000 % IJ SOLN
INTRAMUSCULAR | Status: AC
  Filled 2012-08-20: qty 30

## 2012-08-20 MED ORDER — MIDAZOLAM HCL 2 MG/2ML IJ SOLN: 0.5000 mg | Freq: Once | INTRAMUSCULAR | Status: DC | PRN

## 2012-08-20 MED ORDER — ONDANSETRON HCL 4 MG/2ML IJ SOLN: 4.0000 mg | Freq: Four times a day (QID) | INTRAMUSCULAR | Status: DC | PRN

## 2012-08-20 MED ORDER — MEPERIDINE HCL 25 MG/ML IJ SOLN: 6.2500 mg | INTRAMUSCULAR | Status: DC | PRN

## 2012-08-20 MED ORDER — LACTATED RINGERS IV SOLN: INTRAVENOUS | Status: DC

## 2012-08-20 MED ORDER — PROMETHAZINE HCL 25 MG/ML IJ SOLN: 6.2500 mg | INTRAMUSCULAR | Status: DC | PRN

## 2012-08-20 MED ORDER — PROPOFOL 10 MG/ML IV EMUL
INTRAVENOUS | Status: DC | PRN
  Administered 2012-08-20: 150 mg via INTRAVENOUS

## 2012-08-20 MED ORDER — CHLORHEXIDINE GLUCONATE 4 % EX LIQD: 1.0000 "application " | Freq: Once | CUTANEOUS | Status: DC

## 2012-08-20 MED ORDER — GLYCOPYRROLATE 0.2 MG/ML IJ SOLN
INTRAMUSCULAR | Status: DC | PRN
  Administered 2012-08-20: 0.4 mg via INTRAVENOUS

## 2012-08-20 MED ORDER — SODIUM CHLORIDE 0.9 % IV SOLN
INTRAVENOUS | Status: DC | PRN
  Administered 2012-08-20: 12:00:00

## 2012-08-20 MED ORDER — ROCURONIUM BROMIDE 100 MG/10ML IV SOLN
INTRAVENOUS | Status: DC | PRN
  Administered 2012-08-20: 50 mg via INTRAVENOUS

## 2012-08-20 MED ORDER — BUPIVACAINE HCL (PF) 0.25 % IJ SOLN
INTRAMUSCULAR | Status: AC
  Filled 2012-08-20: qty 30

## 2012-08-20 MED ORDER — LACTATED RINGERS IV SOLN
INTRAVENOUS | Status: DC | PRN
  Administered 2012-08-20 (×2): via INTRAVENOUS

## 2012-08-20 SURGICAL SUPPLY — 79 items
APPLIER CLIP 5 13 M/L LIGAMAX5 (MISCELLANEOUS) ×3
BANDAGE ADHESIVE 1X3 (GAUZE/BANDAGES/DRESSINGS) ×9 IMPLANT
BENZOIN TINCTURE PRP APPL 2/3 (GAUZE/BANDAGES/DRESSINGS) ×6 IMPLANT
BLADE SURG 10 STRL SS (BLADE) IMPLANT
BLADE SURG 15 STRL LF DISP TIS (BLADE) IMPLANT
BLADE SURG 15 STRL SS (BLADE)
BLADE SURG ROTATE 9660 (MISCELLANEOUS) IMPLANT
CANISTER SUCTION 2500CC (MISCELLANEOUS) ×3 IMPLANT
CHLORAPREP W/TINT 26ML (MISCELLANEOUS) ×3 IMPLANT
CLIP APPLIE 5 13 M/L LIGAMAX5 (MISCELLANEOUS) ×2 IMPLANT
CLOTH BEACON ORANGE TIMEOUT ST (SAFETY) ×3 IMPLANT
COVER MAYO STAND STRL (DRAPES) ×3 IMPLANT
COVER SURGICAL LIGHT HANDLE (MISCELLANEOUS) ×6 IMPLANT
COVER TABLE BACK 60X90 (DRAPES) IMPLANT
DECANTER SPIKE VIAL GLASS SM (MISCELLANEOUS) IMPLANT
DERMABOND ADVANCED (GAUZE/BANDAGES/DRESSINGS)
DERMABOND ADVANCED .7 DNX12 (GAUZE/BANDAGES/DRESSINGS) IMPLANT
DRAPE C-ARM 42X72 X-RAY (DRAPES) ×3 IMPLANT
DRAPE PED LAPAROTOMY (DRAPES) ×3 IMPLANT
DRAPE UTILITY 15X26 W/TAPE STR (DRAPE) ×12 IMPLANT
DRSG TEGADERM 4X4.75 (GAUZE/BANDAGES/DRESSINGS) ×6 IMPLANT
ELECT COATED BLADE 2.86 ST (ELECTRODE) ×3 IMPLANT
ELECT REM PT RETURN 9FT ADLT (ELECTROSURGICAL) ×3
ELECTRODE REM PT RTRN 9FT ADLT (ELECTROSURGICAL) ×2 IMPLANT
GAUZE PACKING IODOFORM 1/4X5 (PACKING) IMPLANT
GAUZE SPONGE 2X2 8PLY STRL LF (GAUZE/BANDAGES/DRESSINGS) ×4 IMPLANT
GLOVE BIO SURGEON STRL SZ7.5 (GLOVE) ×3 IMPLANT
GLOVE BIOGEL M STRL SZ7.5 (GLOVE) ×6 IMPLANT
GLOVE BIOGEL PI IND STRL 6.5 (GLOVE) ×2 IMPLANT
GLOVE BIOGEL PI IND STRL 7.0 (GLOVE) ×6 IMPLANT
GLOVE BIOGEL PI IND STRL 7.5 (GLOVE) ×4 IMPLANT
GLOVE BIOGEL PI IND STRL 8 (GLOVE) ×4 IMPLANT
GLOVE BIOGEL PI INDICATOR 6.5 (GLOVE) ×1
GLOVE BIOGEL PI INDICATOR 7.0 (GLOVE) ×3
GLOVE BIOGEL PI INDICATOR 7.5 (GLOVE) ×2
GLOVE BIOGEL PI INDICATOR 8 (GLOVE) ×2
GLOVE ECLIPSE 6.5 STRL STRAW (GLOVE) ×3 IMPLANT
GLOVE SURG SS PI 6.5 STRL IVOR (GLOVE) ×3 IMPLANT
GOWN PREVENTION PLUS XLARGE (GOWN DISPOSABLE) ×3 IMPLANT
GOWN STRL NON-REIN LRG LVL3 (GOWN DISPOSABLE) ×12 IMPLANT
GOWN STRL REIN XL XLG (GOWN DISPOSABLE) ×3 IMPLANT
HEMOSTAT SNOW SURGICEL 2X4 (HEMOSTASIS) ×3 IMPLANT
KIT BASIN OR (CUSTOM PROCEDURE TRAY) ×3 IMPLANT
KIT ROOM TURNOVER OR (KITS) ×3 IMPLANT
NEEDLE HYPO 25X1 1.5 SAFETY (NEEDLE) IMPLANT
NS IRRIG 1000ML POUR BTL (IV SOLUTION) ×3 IMPLANT
PAD ARMBOARD 7.5X6 YLW CONV (MISCELLANEOUS) ×3 IMPLANT
PENCIL BUTTON HOLSTER BLD 10FT (ELECTRODE) ×3 IMPLANT
POUCH SPECIMEN RETRIEVAL 10MM (ENDOMECHANICALS) ×3 IMPLANT
SCISSORS LAP 5X35 DISP (ENDOMECHANICALS) IMPLANT
SET CHOLANGIOGRAPH 5 50 .035 (SET/KITS/TRAYS/PACK) ×3 IMPLANT
SET IRRIG TUBING LAPAROSCOPIC (IRRIGATION / IRRIGATOR) ×3 IMPLANT
SLEEVE ENDOPATH XCEL 5M (ENDOMECHANICALS) ×6 IMPLANT
SPECIMEN JAR MEDIUM (MISCELLANEOUS) IMPLANT
SPECIMEN JAR SMALL (MISCELLANEOUS) ×3 IMPLANT
SPONGE GAUZE 2X2 STER 10/PKG (GAUZE/BANDAGES/DRESSINGS) ×2
SPONGE GAUZE 4X4 12PLY (GAUZE/BANDAGES/DRESSINGS) IMPLANT
STRIP CLOSURE SKIN 1/2X4 (GAUZE/BANDAGES/DRESSINGS) ×3 IMPLANT
SUT ETHILON 2 0 FS 18 (SUTURE) IMPLANT
SUT ETHILON 4 0 PS 2 18 (SUTURE) IMPLANT
SUT MNCRL AB 4-0 PS2 18 (SUTURE) ×6 IMPLANT
SUT SILK 2 0 SH (SUTURE) IMPLANT
SUT VIC AB 2-0 SH 27 (SUTURE)
SUT VIC AB 2-0 SH 27XBRD (SUTURE) IMPLANT
SUT VIC AB 3-0 SH 27 (SUTURE) ×1
SUT VIC AB 3-0 SH 27X BRD (SUTURE) ×2 IMPLANT
SUT VICRYL 4-0 PS2 18IN ABS (SUTURE) IMPLANT
SYR CONTROL 10ML LL (SYRINGE) IMPLANT
TOWEL OR 17X24 6PK STRL BLUE (TOWEL DISPOSABLE) ×3 IMPLANT
TOWEL OR 17X26 10 PK STRL BLUE (TOWEL DISPOSABLE) ×3 IMPLANT
TOWEL OR NON WOVEN STRL DISP B (DISPOSABLE) IMPLANT
TRAY LAPAROSCOPIC (CUSTOM PROCEDURE TRAY) ×3 IMPLANT
TROCAR XCEL BLUNT TIP 100MML (ENDOMECHANICALS) ×3 IMPLANT
TROCAR XCEL NON-BLD 5MMX100MML (ENDOMECHANICALS) ×3 IMPLANT
TUBE ANAEROBIC SPECIMEN COL (MISCELLANEOUS) IMPLANT
TUBE CONNECTING 12X1/4 (SUCTIONS) ×3 IMPLANT
UNDERPAD 30X30 INCONTINENT (UNDERPADS AND DIAPERS) IMPLANT
WATER STERILE IRR 1000ML POUR (IV SOLUTION) IMPLANT
YANKAUER SUCT BULB TIP NO VENT (SUCTIONS) IMPLANT

## 2012-08-20 NOTE — Progress Notes (Signed)
NO C/O PAIN,NAUSEA, AND VOIDED

## 2012-08-20 NOTE — Anesthesia Procedure Notes (Signed)
Procedure Name: Intubation Date/Time: 08/20/2012 11:57 AM Performed by: Elon Alas Pre-anesthesia Checklist: Patient identified, Timeout performed, Emergency Drugs available, Suction available and Patient being monitored Patient Re-evaluated:Patient Re-evaluated prior to inductionOxygen Delivery Method: Circle system utilized Preoxygenation: Pre-oxygenation with 100% oxygen Intubation Type: IV induction and Cricoid Pressure applied Ventilation: Mask ventilation without difficulty Laryngoscope Size: Mac and 3 Grade View: Grade I Tube type: Oral Tube size: 7.5 mm Number of attempts: 1 Airway Equipment and Method: Stylet Placement Confirmation: ETT inserted through vocal cords under direct vision,  positive ETCO2 and breath sounds checked- equal and bilateral Secured at: 22 cm Tube secured with: Tape Dental Injury: Teeth and Oropharynx as per pre-operative assessment

## 2012-08-20 NOTE — Anesthesia Preprocedure Evaluation (Addendum)
Anesthesia Evaluation  Patient identified by MRN, date of birth, ID band Patient awake    Reviewed: Allergy & Precautions, H&P , NPO status , Patient's Chart, lab work & pertinent test results  History of Anesthesia Complications Negative for: history of anesthetic complications  Airway Mallampati: I TM Distance: >3 FB Neck ROM: Full    Dental  (+) Upper Dentures and Lower Dentures   Pulmonary COPDformer smoker,  breath sounds clear to auscultation  Pulmonary exam normal       Cardiovascular negative cardio ROS  Rhythm:Regular Rate:Normal     Neuro/Psych PSYCHIATRIC DISORDERS Bipolar Disorder negative neurological ROS     GI/Hepatic Neg liver ROS, GERD-  Medicated and Controlled,  Endo/Other  Hypothyroidism (on replacement) Morbid obesity  Renal/GU negative Renal ROS     Musculoskeletal negative musculoskeletal ROS (+)   Abdominal (+) + obese,   Peds  Hematology negative hematology ROS (+)   Anesthesia Other Findings   Reproductive/Obstetrics negative OB ROS                        Anesthesia Physical Anesthesia Plan  ASA: II  Anesthesia Plan: General   Post-op Pain Management:    Induction: Intravenous  Airway Management Planned: Oral ETT  Additional Equipment:   Intra-op Plan:   Post-operative Plan: Extubation in OR  Informed Consent: I have reviewed the patients History and Physical, chart, labs and discussed the procedure including the risks, benefits and alternatives for the proposed anesthesia with the patient or authorized representative who has indicated his/her understanding and acceptance.   Dental advisory given  Plan Discussed with: Anesthesiologist, Surgeon and CRNA  Anesthesia Plan Comments: (Plan routine monitors, GETA)       Anesthesia Quick Evaluation

## 2012-08-20 NOTE — Preoperative (Signed)
Beta Blockers   Reason not to administer Beta Blockers:Not Applicable 

## 2012-08-20 NOTE — Transfer of Care (Signed)
Immediate Anesthesia Transfer of Care Note  Patient: Amber Gutierrez  Procedure(s) Performed: Procedure(s) (LRB): LAPAROSCOPIC CHOLECYSTECTOMY WITH INTRAOPERATIVE CHOLANGIOGRAM (N/A) EXCISION MASS (N/A)  Patient Location: PACU  Anesthesia Type: General  Level of Consciousness: awake, alert  and oriented  Airway & Oxygen Therapy: Patient Spontanous Breathing and Patient connected to nasal cannula oxygen  Post-op Assessment: Report given to PACU RN, Post -op Vital signs reviewed and stable and Patient moving all extremities X 4  Post vital signs: Reviewed and stable  Complications: No apparent anesthesia complications

## 2012-08-20 NOTE — Anesthesia Postprocedure Evaluation (Signed)
  Anesthesia Post-op Note  Patient: Amber Gutierrez  Procedure(s) Performed: Procedure(s) (LRB): LAPAROSCOPIC CHOLECYSTECTOMY WITH INTRAOPERATIVE CHOLANGIOGRAM (N/A) EXCISION MASS (N/A)  Patient Location: PACU  Anesthesia Type: General  Level of Consciousness: awake, alert , oriented and patient cooperative  Airway and Oxygen Therapy: Patient Spontanous Breathing and Patient connected to nasal cannula oxygen  Post-op Pain: mild  Post-op Assessment: Post-op Vital signs reviewed, Patient's Cardiovascular Status Stable, Respiratory Function Stable, Patent Airway, No signs of Nausea or vomiting and Pain level controlled  Post-op Vital Signs: Reviewed and stable  Complications: No apparent anesthesia complications

## 2012-08-20 NOTE — H&P (Signed)
Amber Gutierrez is an 61 y.o. female.   Chief Complaint: here for surgery HPI: 61 yo WF with symptomatic cholelithiasis and upper back sebaceous cyst. pls see h&p from 7/12.   PMHx, PSHx, SOCHx, FAMHx, ALL reviewed and unchanged   Past Medical History  Diagnosis Date  . History of nephrolithiasis     lithotrypsy calcium stone  . Thyroid disease     hx of RAI x 2 on brand  . Allergic rhinitis     cats  . DJD (degenerative joint disease) of lumbar spine     has seen Dr. Jeral Fruit  . History of recurrent UTIs   . Situational insomnia   . Bronchitis   . Mental disorder   . Bipolar 2 disorder   . Hypothyroidism   . GERD (gastroesophageal reflux disease)   . Contact dermatitis     Past Surgical History  Procedure Date  . Lithotripsy 2010  . Tubal ligation   . Cesarean section     x 2 73 and 75  . Radiation for thyroid polyps   . Appendectomy   . Dental surgery     Family History  Problem Relation Age of Onset  . Heart attack Father     72  deceased  . Alcohol abuse Father   . Breast cancer Sister   . Gallbladder disease Sister   . Bipolar disorder Mother   . Cancer Maternal Grandmother     colon   Social History:  reports that she has quit smoking. She does not have any smokeless tobacco history on file. She reports that she drinks alcohol. She reports that she does not use illicit drugs.  Allergies:  Allergies  Allergen Reactions  . Pravastatin Sodium     REACTION: Muscle pain and weakness  . Sulfonamide Derivatives     REACTION: Hives    Medications Prior to Admission  Medication Sig Dispense Refill  . buPROPion (WELLBUTRIN XL) 300 MG 24 hr tablet Take 300 mg by mouth daily.       . ciprofloxacin (CIPRO) 500 MG tablet Take 500 mg by mouth See admin instructions. Take twice daily for 3 day course WHEN has bladder infection      . Ginkgo Biloba 40 MG TABS Take 40 mg by mouth daily.      Marland Kitchen lamoTRIgine (LAMICTAL) 200 MG tablet Take 200 mg by mouth daily.         Marland Kitchen levothyroxine (SYNTHROID, LEVOTHROID) 112 MCG tablet Take 112 mcg by mouth daily.      . Lutein 20 MG TABS Take 20 mg by mouth daily.      . Multiple Vitamin (MULTIVITAMIN WITH MINERALS) TABS Take 2 tablets by mouth daily.      . naproxen sodium (ANAPROX) 220 MG tablet Take 220 mg by mouth daily as needed. For pain      . OVER THE COUNTER MEDICATION Take 1 tablet by mouth daily. Ultra Energy Plus      . Probiotic Product (PROBIOTIC PO) Take 1 tablet by mouth at bedtime.       Marland Kitchen trimethoprim (TRIMPEX) 100 MG tablet Take 100 mg by mouth at bedtime.         No results found for this or any previous visit (from the past 48 hour(s)). No results found.  Review of Systems  Constitutional: Negative for fever, chills and weight loss.  HENT: Negative for hearing loss.   Respiratory: Negative for shortness of breath.   Cardiovascular: Negative for chest pain,  palpitations, orthopnea and PND.  Gastrointestinal:       See hpi  Neurological: Negative for speech change and seizures.  All other systems reviewed and are negative.    Blood pressure 127/84, pulse 63, temperature 98.1 F (36.7 C), temperature source Oral, resp. rate 20, SpO2 97.00%. Physical Exam  Vitals reviewed. Constitutional: She is oriented to person, place, and time. She appears well-developed and well-nourished. No distress.  HENT:  Head: Normocephalic and atraumatic.  Right Ear: External ear normal.  Left Ear: External ear normal.  Eyes: Conjunctivae are normal. No scleral icterus.  Neck: Normal range of motion. Neck supple. No tracheal deviation present.  Cardiovascular: Normal rate.   Respiratory: Effort normal. No respiratory distress.  GI: Soft. She exhibits no distension. There is no tenderness.  Musculoskeletal: She exhibits no edema.  Neurological: She is alert and oriented to person, place, and time.  Skin: Skin is warm and dry. She is not diaphoretic.       Upper back subcu mass c/w cyst 2 x 2cm- no  infection  Psychiatric: She has a normal mood and affect. Her behavior is normal. Judgment and thought content normal.     Assessment/Plan Symptomatic cholelithiasis Upper back sebaceous cyst   To OR for cholecystectomy and excision of sebaceous cyst. All questions asked and answered.   Mary Sella. Andrey Campanile, MD, FACS General, Bariatric, & Minimally Invasive Surgery [facility], [place]   [facility] M 08/20/2012, 11:45 AM

## 2012-08-20 NOTE — Op Note (Signed)
PROCEDURE:  1) Laparoscopic Cholecystectomy with IOC  2) excision of upper epidermoid inclusion cyst  Indications: This patient presents with symptomatic gallbladder disease and will undergo laparoscopic cholecystectomy. She also has an upper back subcutaneous mass c/w an epidermoid inclusion cyst that is uncomfortable and desires surgical excision  Pre-operative Diagnosis: Calculus of gallbladder without mention of cholecystitis or obstruction; upper back subcutaneous mass  Post-operative Diagnosis: Same; upper back epidermoid inclusion cyst  Surgeon: Atilano Ina   Assistants: none  Anesthesia: General endotracheal anesthesia  ASA Class: 2  Procedure Details  The patient was seen again in the Holding Room. The risks, benefits, complications, treatment options, and expected outcomes were discussed with the patient. The possibilities of reaction to medication, pulmonary aspiration, perforation of viscus, bleeding, recurrent infection, finding a normal gallbladder, the need for additional procedures, failure to diagnose a condition, the possible need to convert to an open procedure, and creating a complication requiring transfusion or operation were discussed with the patient. The likelihood of improving the patient's symptoms with return to their baseline status is good.  The patient and/or family concurred with the proposed plan, giving informed consent. The site of surgery properly noted. The patient was taken to Operating Room, identified as Amber Gutierrez and the procedure verified as Laparoscopic Cholecystectomy with Intraoperative Cholangiogram and excision of upper back subcutaneous mass. A Time Out was held and the above information confirmed.  Prior to the induction of general anesthesia, antibiotic prophylaxis was administered. General endotracheal anesthesia was then administered and tolerated well. After the induction, the abdomen was prepped with Chloraprep and draped in the  sterile fashion. The patient was positioned in the supine position.  Local anesthetic agent was injected into the skin near the umbilicus and an incision made. We dissected down to the abdominal fascia with blunt dissection.  The fascia was incised vertically and we entered the peritoneal cavity bluntly.  A pursestring suture of 0-Vicryl was placed around the fascial opening.  The Hasson cannula was inserted and secured with the stay suture.  Pneumoperitoneum was then created with CO2 and tolerated well without any adverse changes in the patient's vital signs. An 5-mm port was placed in the subxiphoid position.  Two 5-mm ports were placed in the right upper quadrant. All skin incisions were infiltrated with a local anesthetic agent before making the incision and placing the trocars.   We positioned the patient in reverse Trendelenburg, tilted slightly to the patient's left.  The gallbladder was identified, the fundus grasped and retracted cephalad. Adhesions were lysed bluntly and with the electrocautery where indicated, taking care not to injure any adjacent organs or viscus. The infundibulum was grasped and retracted laterally, exposing the peritoneum overlying the triangle of Calot. This was then divided and exposed in a blunt fashion. A critical view of the cystic duct and cystic artery was obtained.  The cystic duct was clearly identified and bluntly dissected circumferentially. The cystic duct was ligated with a clip distally.   An incision was made in the cystic duct and the [facility] cholangiogram catheter introduced. The catheter was secured using a clip. A cholangiogram was then obtained which showed good visualization of the distal and proximal biliary tree with no sign of filling defects or obstruction.  Contrast flowed easily into the duodenum. The catheter was then removed.   The cystic duct was then ligated with clips and divided. The cystic artery was identified, dissected free, ligated with clips  and divided as well.   The gallbladder was  dissected from the liver bed in retrograde fashion with the electrocautery. An additional clip was placed on what appeared to be a small lymphatic.The gallbladder was removed and placed in an Endocatch sac.  The gallbladder and Endocatch sac were then removed through the umbilical port site. The liver bed was irrigated and inspected. Hemostasis was achieved with the electrocautery. Copious irrigation was utilized and was repeatedly aspirated until clear.  The pursestring suture was used to close the umbilical fascia.    We again inspected the right upper quadrant for hemostasis.  The umbilical closure was inspected and there was no air leak and nothing trapped within the closure. Pneumoperitoneum was released as we removed the trocars.  4-0 Monocryl was used to close the skin.   Benzoin, steri-strips, and clean dressings were applied.   The drapes were then removed. The patient was then placed in the lateral position with her right side up. She had previously been placed on a beanbag. She had the appropriate padding between her legs. Her upper back was prepped and draped with ChloraPrep. The lesion measures roughly 2 cm x 0.5 cm. It was in her upper back slightly to the left of her spine..  A vertical 2 cm incision was made with a #15 blade. The dermis was divided. The cyst was removed in its entirety. The cavity was irrigated and hemostasis was achieved with the cautery. I injected local in the area. I then reapproximated the deep dermis with inverted interrupted 3-0 Vicryl sutures. The skin was reapproximated with a 4-0 Monocryl in a subcuticular fashion. Benzoin, Steri-Strips, and a 4x 4 and a Tegaderm were then applied.  The patient was then extubated and brought to the recovery room in stable condition. Instrument, sponge, and needle counts were correct at closure and at the conclusion of the case.   Findings: Cholelithiasis; +critical view; 2 x 1.5cm  sebaceous cyst; 2 layer closure  Estimated Blood Loss: Minimal         Drains: none         Specimens: Gallbladder           Complications: None; patient tolerated the procedure well.         Disposition: PACU - hemodynamically stable.         Condition: stable  Mary Sella. Andrey Campanile, MD, FACS General, Bariatric, & Minimally Invasive Surgery [facility], [place]

## 2012-08-21 ENCOUNTER — Telehealth (INDEPENDENT_AMBULATORY_CARE_PROVIDER_SITE_OTHER): Payer: Self-pay

## 2012-08-21 ENCOUNTER — Encounter (HOSPITAL_COMMUNITY): Payer: Self-pay | Admitting: General Surgery

## 2012-08-21 NOTE — Telephone Encounter (Signed)
Pt given po appt. 

## 2012-08-28 ENCOUNTER — Telehealth (INDEPENDENT_AMBULATORY_CARE_PROVIDER_SITE_OTHER): Payer: Self-pay | Admitting: General Surgery

## 2012-08-28 NOTE — Telephone Encounter (Signed)
Pt called and wanted a Rx for pain she just had gallbladder surgery on 08-20-12. I called in the Vicodin 5/325 into [facility] [phone]. And pt is aware of the refill

## 2012-09-23 ENCOUNTER — Encounter (INDEPENDENT_AMBULATORY_CARE_PROVIDER_SITE_OTHER): Payer: 59 | Admitting: General Surgery

## 2012-12-16 ENCOUNTER — Encounter (INDEPENDENT_AMBULATORY_CARE_PROVIDER_SITE_OTHER): Payer: Self-pay | Admitting: General Surgery

## 2012-12-16 ENCOUNTER — Ambulatory Visit (INDEPENDENT_AMBULATORY_CARE_PROVIDER_SITE_OTHER): Payer: 59 | Admitting: General Surgery

## 2012-12-16 VITALS — BP 122/76 | HR 72 | Temp 98.0°F | Resp 16 | Ht 62.5 in | Wt 237.4 lb

## 2012-12-16 DIAGNOSIS — Z09 Encounter for follow-up examination after completed treatment for conditions other than malignant neoplasm: Secondary | ICD-10-CM

## 2012-12-16 NOTE — Patient Instructions (Signed)
Can resume full activities 

## 2012-12-16 NOTE — Progress Notes (Signed)
Subjective:     Patient ID: Amber Gutierrez, female   DOB: June 24, 1951, 61 y.o.   MRN: [id number]  HPI 61 year old Caucasian female comes in for followup after undergoing laparoscopic cholecystectomy and excision of upper back epidermal inclusion cyst in August 2013. This is her first followup appointment. She states that she did well after surgery. She states that the incision on her upper back to a little bit of time to heal. She denies any fever, chills, nausea, vomiting, diarrhea or constipation. She states that the symptoms she had preoperatively with respect to her abdominal discomfort have completely resolved.  Review of Systems     Objective:   Physical Exam BP 122/76  Pulse 72  Temp 98 F (36.7 C) (Temporal)  Resp 16  Ht 5' 2.5" (1.588 m)  Wt 237 lb 6.4 oz (107.684 kg)  BMI 42.73 kg/m2  Gen: alert, NAD, non-toxic appearing Pupils: equal, no scleral icterus  Abd: soft, nontender, nondistended. Well-healed trocar sites. No cellulitis. No incisional hernia Ext: no edema, no calf tenderness Skin: no rash, no jaundice; upper back shows a well-healed r incision without cellulitis or induration      Assessment:     Status post laparoscopic cholecystectomy and excision of upper back epidermal inclusion cyst    Plan:     We reviewed her pathology report which showed chronic cholecystitis. She appears to be doing very well. She is released to full activities. Followup as needed   Mary Sella. Andrey Campanile, MD, FACS General, Bariatric, & Minimally Invasive Surgery [facility], [place]

## 2013-03-24 ENCOUNTER — Other Ambulatory Visit: Payer: Self-pay | Admitting: Dermatology

## 2013-05-31 ENCOUNTER — Ambulatory Visit: Payer: 59 | Admitting: Family Medicine

## 2013-06-23 ENCOUNTER — Ambulatory Visit: Payer: 59 | Admitting: Family Medicine

## 2013-06-24 ENCOUNTER — Other Ambulatory Visit: Payer: Self-pay | Admitting: Family Medicine

## 2013-06-25 ENCOUNTER — Telehealth: Payer: Self-pay | Admitting: Internal Medicine

## 2013-06-25 MED ORDER — LEVOTHYROXINE SODIUM 112 MCG PO TABS: 112.0000 ug | ORAL_TABLET | Freq: Every day | ORAL | Status: DC

## 2013-06-25 NOTE — Telephone Encounter (Signed)
Pt last appt was 04-2011 with Dr Fabian Sharp. Pt has appt with Dr Dayton Martes on 07-12-13 as new pt. Pt needs a refill on synthroid calling to [facility]. Pt is out of med

## 2013-07-12 ENCOUNTER — Ambulatory Visit (INDEPENDENT_AMBULATORY_CARE_PROVIDER_SITE_OTHER): Payer: 59 | Admitting: Family Medicine

## 2013-07-12 ENCOUNTER — Encounter: Payer: Self-pay | Admitting: Family Medicine

## 2013-07-12 ENCOUNTER — Encounter: Payer: Self-pay | Admitting: *Deleted

## 2013-07-12 ENCOUNTER — Other Ambulatory Visit: Payer: Self-pay | Admitting: *Deleted

## 2013-07-12 VITALS — BP 120/70 | HR 80 | Temp 98.2°F | Ht 62.5 in | Wt 239.0 lb

## 2013-07-12 DIAGNOSIS — E785 Hyperlipidemia, unspecified: Secondary | ICD-10-CM

## 2013-07-12 DIAGNOSIS — E890 Postprocedural hypothyroidism: Secondary | ICD-10-CM

## 2013-07-12 DIAGNOSIS — F3130 Bipolar disorder, current episode depressed, mild or moderate severity, unspecified: Secondary | ICD-10-CM

## 2013-07-12 DIAGNOSIS — F319 Bipolar disorder, unspecified: Secondary | ICD-10-CM

## 2013-07-12 DIAGNOSIS — E039 Hypothyroidism, unspecified: Secondary | ICD-10-CM

## 2013-07-12 LAB — COMPREHENSIVE METABOLIC PANEL
ALT: 22 U/L (ref 0–35)
AST: 24 U/L (ref 0–37)
Alkaline Phosphatase: 102 U/L (ref 39–117)
Glucose, Bld: 98 mg/dL (ref 70–99)
Sodium: 138 mEq/L (ref 135–145)
Total Bilirubin: 0.9 mg/dL (ref 0.3–1.2)
Total Protein: 7.6 g/dL (ref 6.0–8.3)

## 2013-07-12 LAB — LIPID PANEL
Cholesterol: 174 mg/dL (ref 0–200)
LDL Cholesterol: 103 mg/dL — ABNORMAL HIGH (ref 0–99)
Total CHOL/HDL Ratio: 4
VLDL: 26.8 mg/dL (ref 0.0–40.0)

## 2013-07-12 LAB — TSH: TSH: 1.45 u[IU]/mL (ref 0.35–5.50)

## 2013-07-12 LAB — T4, FREE: Free T4: 1.22 ng/dL (ref 0.60–1.60)

## 2013-07-12 MED ORDER — LEVOTHYROXINE SODIUM 112 MCG PO TABS: 112.0000 ug | ORAL_TABLET | Freq: Every day | ORAL | Status: DC

## 2013-07-12 NOTE — Progress Notes (Signed)
Subjective:    Patient ID: Amber Gutierrez, female    DOB: [birth year], 62 y.o.   MRN: [id number]  HPI  62 yo pleasant female here to establish care.  Has been seeing Dr. Fabian Sharp for years but lives close to our office.  Hypothyroidism- has been on same dose of synthroid for years.  Has been feeling like her weight has been increasing.  Thinks she needs a dose adjustment. Denies any other symptoms of hypo or hyperthyroidism. Wt Readings from Last 3 Encounters:  07/12/13 239 lb (108.41 kg)  12/16/12 237 lb 6.4 oz (107.684 kg)  08/14/12 238 lb 3.2 oz (108.047 kg)   Bipolar disorder- followed by psych every 3 months.  Has been on Lamictal for 3 years (and Wellbutrin for years before that).  Feels like Lamictal has made a real improvement. No recent episodes of depression or mania.  Husband has MS but doing well.  Retired last year and loves to travel.  Sees Dr. Hyacinth Meeker, OBGYN, has appt to see her and for mammogram in 07/2013.  Has never had zostavax. Recent Mohs surgery.  Quit smoking 3 years ago!  Patient Active Problem List   Diagnosis Date Noted  . Bipolar depression 07/12/2013  . OTHER POSTABLATIVE HYPOTHYROIDISM 11/13/2010  . HYPERLIPIDEMIA 11/13/2010  . OBESITY 11/13/2010  . ADJUSTMENT DISORDER WITH DEPRESSED MOOD 11/13/2010  . ALLERGIC RHINITIS 11/13/2010  . UTI'S, RECURRENT 11/13/2010  . DEGENERATIVE JOINT DISEASE, BACK 11/13/2010  . NEPHROLITHIASIS, HX OF 11/13/2010   Past Medical History  Diagnosis Date  . History of nephrolithiasis     lithotrypsy calcium stone  . Thyroid disease     hx of RAI x 2 on brand  . Allergic rhinitis     cats  . DJD (degenerative joint disease) of lumbar spine     has seen Dr. Jeral Fruit  . History of recurrent UTIs   . Situational insomnia   . Bronchitis   . Mental disorder   . Bipolar 2 disorder   . Hypothyroidism   . GERD (gastroesophageal reflux disease)   . Contact dermatitis    Past Surgical History  Procedure Laterality  Date  . Lithotripsy  2010  . Tubal ligation    . Cesarean section      x 2 73 and 75  . Radiation for thyroid polyps    . Appendectomy    . Dental surgery    . Cholecystectomy  08/20/2012    Procedure: LAPAROSCOPIC CHOLECYSTECTOMY WITH INTRAOPERATIVE CHOLANGIOGRAM;  Surgeon: Atilano Ina, MD,FACS;  Location: [facility] OR;  Service: General;  Laterality: N/A;  . Mass excision  08/20/2012    Procedure: EXCISION MASS;  Surgeon: Atilano Ina, MD,FACS;  Location: [facility] OR;  Service: General;  Laterality: N/A;   History  Substance Use Topics  . Smoking status: Former Games developer  . Smokeless tobacco: Not on file  . Alcohol Use: Yes     Comment: socially   Family History  Problem Relation Age of Onset  . Heart attack Father     37  deceased  . Alcohol abuse Father   . Breast cancer Sister   . Gallbladder disease Sister   . Bipolar disorder Mother   . Cancer Maternal Grandmother     colon   Allergies  Allergen Reactions  . Pravastatin Sodium     REACTION: Muscle pain and weakness  . Sulfonamide Derivatives     REACTION: Hives   Current Outpatient Prescriptions on File Prior to Visit  Medication Sig Dispense  Refill  . buPROPion (WELLBUTRIN XL) 300 MG 24 hr tablet Take 300 mg by mouth daily.       . Ginkgo Biloba 40 MG TABS Take 40 mg by mouth daily.      Marland Kitchen lamoTRIgine (LAMICTAL) 200 MG tablet Take 200 mg by mouth daily.       Marland Kitchen levothyroxine (SYNTHROID, LEVOTHROID) 112 MCG tablet Take 1 tablet (112 mcg total) by mouth daily.  30 tablet  0  . Lutein 20 MG TABS Take 20 mg by mouth daily.      . Multiple Vitamin (MULTIVITAMIN WITH MINERALS) TABS Take 2 tablets by mouth daily.      . naproxen sodium (ANAPROX) 220 MG tablet Take 220 mg by mouth daily as needed. For pain      . OVER THE COUNTER MEDICATION Take 1 tablet by mouth daily. Ultra Energy Plus      . Probiotic Product (PROBIOTIC PO) Take 1 tablet by mouth at bedtime.       Marland Kitchen trimethoprim (TRIMPEX) 100 MG tablet Take 100 mg by mouth at  bedtime.        No current facility-administered medications on file prior to visit.   The PMH, PSH, Social History, Family History, Medications, and allergies have been reviewed in [facility], and have been updated if relevant.   Review of Systems    See HPI Patient reports no  vision/ hearing changes,anorexia,  fever ,adenopathy, persistant / recurrent hoarseness, swallowing issues, chest pain, edema,persistant / recurrent cough, hemoptysis, dyspnea(rest, exertional, paroxysmal nocturnal), gastrointestinal  bleeding (melena, rectal bleeding), abdominal pain, excessive heart burn, GU symptoms(dysuria, hematuria, pyuria, voiding/incontinence  Issues) syncope, focal weakness, severe memory loss, concerning skin lesions, depression, anxiety, abnormal bruising/bleeding, major joint swelling, breast masses or abnormal vaginal bleeding.    Objective:   Physical Exam BP 120/70  Pulse 80  Temp(Src) 98.2 F (36.8 C)  Ht 5' 2.5" (1.588 m)  Wt 239 lb (108.41 kg)  BMI 42.99 kg/m2 Wt Readings from Last 3 Encounters:  07/12/13 239 lb (108.41 kg)  12/16/12 237 lb 6.4 oz (107.684 kg)  08/14/12 238 lb 3.2 oz (108.047 kg)   General:  Well-developed,well-nourished,in no acute distress; alert,appropriate and cooperative throughout examination Lungs:  Normal respiratory effort, chest expands symmetrically. Lungs are clear to auscultation, no crackles or wheezes. Heart:  Normal rate and regular rhythm. S1 and S2 normal without gallop, murmur, click, rub or other extra sounds. Abdomen:  Bowel sounds positive,abdomen soft and non-tender without masses, organomegaly or hernias noted. Msk:  No deformity or scoliosis noted of thoracic or lumbar spine.   Extremities:  No clubbing, cyanosis, edema, or deformity noted with normal full range of motion of all joints.   Neurologic:  alert & oriented X3 and gait normal.   Skin:  Sutures on left forehead (recent Mohs) c/d/i Cervical Nodes:  No lymphadenopathy  noted Axillary Nodes:  No palpable lymphadenopathy Psych:  Cognition and judgment appear intact. Alert and cooperative with normal attention span and concentration. No apparent delusions, illusions, hallucinations     Assessment & Plan:  1. HYPERLIPIDEMIA Recheck labs today. - Comprehensive metabolic panel  2. Other postablative hypothyroidism May be under corrected.  Recheck labs today.  3. Unspecified hypothyroidism See above. - TSH - T4, Free - Lipid Panel  4. Bipolar depression Stable on current meds.

## 2013-07-12 NOTE — Patient Instructions (Addendum)
It was to meet you. Check with your insurance to see if they will cover the shingles shot.

## 2013-07-21 ENCOUNTER — Telehealth: Payer: Self-pay

## 2013-07-21 NOTE — Telephone Encounter (Signed)
Pt request refill on levothyroxine to [facility] pharmacy. Spoke with Baird Lyons at [place] rx was on hold; will get rx ready. Pt notified.

## 2013-07-22 ENCOUNTER — Other Ambulatory Visit: Payer: Self-pay

## 2013-07-22 DIAGNOSIS — Z1231 Encounter for screening mammogram for malignant neoplasm of breast: Secondary | ICD-10-CM

## 2013-08-05 ENCOUNTER — Other Ambulatory Visit: Payer: Self-pay | Admitting: Urology

## 2013-08-10 ENCOUNTER — Ambulatory Visit
Admission: RE | Admit: 2013-08-10 | Discharge: 2013-08-10 | Disposition: A | Discharge status: Home / Self Care | Payer: 59 | Source: Ambulatory Visit

## 2013-08-10 DIAGNOSIS — Z1231 Encounter for screening mammogram for malignant neoplasm of breast: Secondary | ICD-10-CM

## 2013-08-19 ENCOUNTER — Encounter (HOSPITAL_BASED_OUTPATIENT_CLINIC_OR_DEPARTMENT_OTHER): Payer: Self-pay | Admitting: *Deleted

## 2013-08-20 ENCOUNTER — Encounter (HOSPITAL_BASED_OUTPATIENT_CLINIC_OR_DEPARTMENT_OTHER): Payer: Self-pay | Admitting: *Deleted

## 2013-08-20 NOTE — Progress Notes (Signed)
NPO AFTER MN. ARRIVES AT 0600. NEEDS HG. WILL TAKE WELLBUTRIN, SYNTHROID, AND WELLBUTRIN AM OF SURG W/ SIPS OF WATER.

## 2013-08-25 NOTE — H&P (Signed)
History of Present Illness                     F/u recurrent UTI and pyelonephritis (She gets HA, malaise, low back tension and pain with UTI. She will get high fevers.) -2009 cystoscopy - normal.  -Apr 2011 CT (small stones, no hydronephrosis) -Feb 2012, malaise and fever and voiding symptoms while in [place]. She was hospitalized for UTI. CT scan showed no obstruction and apparently no significant stone disease.  -Jun 2013 - renal/bladder U/S - normal PVR (22 ml), normal kidneys, no obvious stones - I reviewed images -Jul 2014 left flank pain radiating to front, Fever, HA. Cipro helped.  On TMP.   She gets HA, malaise, low back tension and pain with UTI. She will get high fevers.    Interval Hx She returns for exam and cystoscopy for bladder screening given her unusual symptoms. She started abx/Cipro three days ago. U/a still with some pyruia today. Need to rule out bladder lesion/fistual, urethral tic, etc.    Past Medical History Problems  1. History of  Arthritis V13.4 2. History of  Hydronephrosis Right 591 3. History of  Hypothyroidism 244.9 4. History of  Ureteral Stone Right 592.1  Surgical History Problems  1. History of  Cesarean Section 2. History of  Cholecystectomy 3. History of  Lithotripsy  Current Meds 1. Ciprofloxacin HCl 500 MG Oral Tablet; Take 1 tablet twice daily; Therapy: 22Jul2014 to  (Evaluate:27Jul2014)  Requested for: 22Jul2014; Last Rx:22Jul2014 2. Ciprofloxacin HCl 500 MG Oral Tablet; TAKE 1 TABLET Twice daily; Therapy: 30Apr2012 to  (Evaluate:01Jul2013)  Requested for: 11Jun2013; Last Rx:11Jun2013 3. LamoTRIgine 200 MG Oral Tablet; Therapy: 07Oct2012 to 4. Ocuvite TABS; Therapy: (Recorded:12May2009) to 5. Synthroid 112 MCG Oral Tablet; Therapy: 22Mar2011 to 6. Veramyst 27.5 MCG/SPRAY Nasal Suspension; Therapy: 07Oct2012 to 7. Wellbutrin XL 300 MG Oral Tablet Extended Release 24 Hour; Therapy: (Recorded:12May2009) to 8. Womens Daily Multivitamin  TABS; Therapy: (Recorded:15Jul2014) to  Allergies Medication  1. Sulfa Drugs  Family History Problems  1. Family history of  Family Health Status Number Of Children 1 son1 daughter 2. Paternal history of  Heart Disease V17.49 Denied  3. Paternal history of  Death In The Family Father father passed @ age 57heart disease  Social History Problems  1. Alcohol Use 2. Being A Social Drinker 3. Caffeine Use 2 drinks daily 4. Marital History - Currently Married 5. Occupation: Public relations account executive 6. Tobacco Use V15.82 smokes 1/2 pack dailysmoking for 30 years  Review of Systems Constitutional, cardiovascular, pulmonary and neurological system(s) were reviewed and pertinent findings if present are noted.    Physical Exam Constitutional: Well nourished and well developed . No acute distress.  ENT:. The ears and nose are normal in appearance.  Neck: The appearance of the neck is normal and no neck mass is present.  Pulmonary: No respiratory distress and normal respiratory rhythm and effort.  Cardiovascular: Heart rate and rhythm are normal . No peripheral edema.  Abdomen: The abdomen is soft and nontender. No masses are palpated. No CVA tenderness. No hernias are palpable. No hepatosplenomegaly noted.  Genitourinary:  Chaperone Present: Fannie Knee.  Examination of the external genitalia shows normal female external genitalia and no lesions. The urethra is normal in appearance and not tender. There is no urethral mass. Vaginal exam demonstrates no abnormalities. The adnexa are palpably normal. The bladder is non tender and not distended. The anus is normal on inspection. The perineum is normal on inspection.  Lymphatics: The femoral  and inguinal nodes are not enlarged or tender.  Skin: Normal skin turgor, no visible rash and no visible skin lesions.  Neuro/Psych:. Mood and affect are appropriate.    Results/Data Urine [Data Includes: Last 1 Day]   07Aug2014  COLOR YELLOW   APPEARANCE CLOUDY    SPECIFIC GRAVITY 1.025   pH 5.5   GLUCOSE NEG mg/dL  BILIRUBIN NEG   KETONE NEG mg/dL  BLOOD SMALL   PROTEIN NEG mg/dL  UROBILINOGEN 0.2 mg/dL  NITRITE NEG   LEUKOCYTE ESTERASE MOD   SQUAMOUS EPITHELIAL/HPF MODERATE   WBC 21-50 WBC/hpf  RBC 0-2 RBC/hpf  BACTERIA FEW   CRYSTALS NONE SEEN   CASTS NONE SEEN    Procedure  Procedure: Cystoscopy  Chaperone Present: Fannie Knee.  Informed Consent: Risks, benefits, and potential adverse events were discussed and informed consent was obtained from the patient.  Prep: The patient was prepped with betadine.  Procedure Note:  Urethral meatus:. No abnormalities.  Anterior urethra: No abnormalities.  Bladder: Visulization was clear. The ureteral orifices were in the normal anatomic position bilaterally and had clear efflux of urine. A systematic survey of the bladder demonstrated no bladder tumors or stones. The mucosa was smooth without abnormalities. Examination of the bladder demonstrated erythematous mucosa.  Some of these areas have an early papillary appearance. The patient tolerated the procedure well.  Complications: None.    Assessment Assessed  1. Chronic Cystitis 595.2 2. 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 Follow-up Schedule Surgery Office  Follow-up  Done: 07Aug2014 Health Maintenance (V70.0)  2. UA With REFLEX  Done: 07Aug2014 11:40AM  Discussion/Summary       We discussed the erythematous patches in her bladder are likely benign and related to inflammation but we would want to rule out neoplasm such as CIS. We discussed options such as continued surveillance with more aggressive antibiotic therapy and repeat cystoscopy or proceeding to the OR for cystoscopy bladder biopsy fulguration. We discussed the risks and benefits of cystoscopy bladder biopsy and fulguration in the OR. All questions answered, she elects to proceed.   cc: [employer] Electronically signed by : Jerilee Field, M.D.; Aug 05 2013 12:30PM

## 2013-08-26 ENCOUNTER — Encounter (HOSPITAL_BASED_OUTPATIENT_CLINIC_OR_DEPARTMENT_OTHER): Payer: Self-pay | Admitting: Anesthesiology

## 2013-08-26 NOTE — Anesthesia Preprocedure Evaluation (Signed)
Anesthesia Evaluation  Patient identified by MRN, date of birth, ID band Patient awake    Reviewed: Allergy & Precautions, H&P , NPO status , Patient's Chart, lab work & pertinent test results  Airway Mallampati: II TM Distance: >3 FB Neck ROM: Full    Dental no notable dental hx.    Pulmonary neg pulmonary ROS,  breath sounds clear to auscultation  Pulmonary exam normal       Cardiovascular negative cardio ROS  Rhythm:Regular Rate:Normal     Neuro/Psych PSYCHIATRIC DISORDERS Depression Bipolar Disorder negative neurological ROS     GI/Hepatic negative GI ROS, Neg liver ROS,   Endo/Other  Hypothyroidism   Renal/GU negative Renal ROS  negative genitourinary   Musculoskeletal negative musculoskeletal ROS (+)   Abdominal (+) + obese,   Peds negative pediatric ROS (+)  Hematology negative hematology ROS (+)   Anesthesia Other Findings   Reproductive/Obstetrics negative OB ROS                           Anesthesia Physical Anesthesia Plan  ASA: II  Anesthesia Plan: General   Post-op Pain Management:    Induction: Intravenous  Airway Management Planned: LMA  Additional Equipment:   Intra-op Plan:   Post-operative Plan: Extubation in OR  Informed Consent: I have reviewed the patients History and Physical, chart, labs and discussed the procedure including the risks, benefits and alternatives for the proposed anesthesia with the patient or authorized representative who has indicated his/her understanding and acceptance.   Dental advisory given  Plan Discussed with: CRNA  Anesthesia Plan Comments:         Anesthesia Quick Evaluation

## 2013-08-27 ENCOUNTER — Ambulatory Visit (HOSPITAL_BASED_OUTPATIENT_CLINIC_OR_DEPARTMENT_OTHER)
Admission: RE | Admit: 2013-08-27 | Discharge: 2013-08-27 | Disposition: A | Discharge status: Home / Self Care | Payer: 59 | Source: Ambulatory Visit | Attending: Urology | Admitting: Urology

## 2013-08-27 ENCOUNTER — Ambulatory Visit (HOSPITAL_BASED_OUTPATIENT_CLINIC_OR_DEPARTMENT_OTHER): Payer: 59 | Admitting: Anesthesiology

## 2013-08-27 ENCOUNTER — Encounter (HOSPITAL_BASED_OUTPATIENT_CLINIC_OR_DEPARTMENT_OTHER): Payer: Self-pay | Admitting: Anesthesiology

## 2013-08-27 ENCOUNTER — Encounter (HOSPITAL_BASED_OUTPATIENT_CLINIC_OR_DEPARTMENT_OTHER)
Admission: RE | Disposition: A | Discharge status: Home / Self Care | Payer: Self-pay | Source: Ambulatory Visit | Attending: Urology

## 2013-08-27 ENCOUNTER — Encounter (HOSPITAL_BASED_OUTPATIENT_CLINIC_OR_DEPARTMENT_OTHER): Payer: Self-pay | Admitting: Certified Registered"

## 2013-08-27 DIAGNOSIS — N3020 Other chronic cystitis without hematuria: Secondary | ICD-10-CM | POA: Insufficient documentation

## 2013-08-27 DIAGNOSIS — E669 Obesity, unspecified: Secondary | ICD-10-CM | POA: Insufficient documentation

## 2013-08-27 DIAGNOSIS — Z87442 Personal history of urinary calculi: Secondary | ICD-10-CM | POA: Insufficient documentation

## 2013-08-27 DIAGNOSIS — F3130 Bipolar disorder, current episode depressed, mild or moderate severity, unspecified: Secondary | ICD-10-CM | POA: Insufficient documentation

## 2013-08-27 DIAGNOSIS — Z87440 Personal history of urinary (tract) infections: Secondary | ICD-10-CM | POA: Insufficient documentation

## 2013-08-27 DIAGNOSIS — M129 Arthropathy, unspecified: Secondary | ICD-10-CM | POA: Insufficient documentation

## 2013-08-27 DIAGNOSIS — Z79899 Other long term (current) drug therapy: Secondary | ICD-10-CM | POA: Insufficient documentation

## 2013-08-27 DIAGNOSIS — D369 Benign neoplasm, unspecified site: Secondary | ICD-10-CM | POA: Insufficient documentation

## 2013-08-27 DIAGNOSIS — Z882 Allergy status to sulfonamides status: Secondary | ICD-10-CM | POA: Insufficient documentation

## 2013-08-27 DIAGNOSIS — E039 Hypothyroidism, unspecified: Secondary | ICD-10-CM | POA: Insufficient documentation

## 2013-08-27 DIAGNOSIS — Z6841 Body Mass Index (BMI) 40.0 and over, adult: Secondary | ICD-10-CM | POA: Insufficient documentation

## 2013-08-27 HISTORY — DX: Postprocedural hypothyroidism: E89.0

## 2013-08-27 HISTORY — DX: Personal history of irradiation: Z92.3

## 2013-08-27 HISTORY — DX: Other specified postprocedural states: Z85.828

## 2013-08-27 HISTORY — PX: CYSTOSCOPY WITH BIOPSY: SHX5122

## 2013-08-27 HISTORY — DX: Other chronic cystitis without hematuria: N30.20

## 2013-08-27 HISTORY — DX: Neoplasm of unspecified behavior of bladder: D49.4

## 2013-08-27 HISTORY — DX: Personal history of other malignant neoplasm of skin: Z98.890

## 2013-08-27 LAB — POCT I-STAT, CHEM 8
Calcium, Ion: 1.24 mmol/L (ref 1.13–1.30)
Chloride: 104 mEq/L (ref 96–112)
HCT: 51 % — ABNORMAL HIGH (ref 36.0–46.0)
Hemoglobin: 17.3 g/dL — ABNORMAL HIGH (ref 12.0–15.0)

## 2013-08-27 SURGERY — CYSTOSCOPY, WITH BIOPSY
Anesthesia: General | Site: Bladder | Wound class: Clean Contaminated

## 2013-08-27 MED ORDER — DEXAMETHASONE SODIUM PHOSPHATE 4 MG/ML IJ SOLN
INTRAMUSCULAR | Status: DC | PRN
  Administered 2013-08-27: 8 mg via INTRAVENOUS

## 2013-08-27 MED ORDER — SUCCINYLCHOLINE CHLORIDE 20 MG/ML IJ SOLN
INTRAMUSCULAR | Status: DC | PRN
  Administered 2013-08-27: 100 mg via INTRAVENOUS

## 2013-08-27 MED ORDER — LIDOCAINE HCL (CARDIAC) 20 MG/ML IV SOLN
INTRAVENOUS | Status: DC | PRN
  Administered 2013-08-27: 60 mg via INTRAVENOUS

## 2013-08-27 MED ORDER — CIPROFLOXACIN HCL 500 MG PO TABS: 500.0000 mg | ORAL_TABLET | Freq: Two times a day (BID) | ORAL | Status: DC

## 2013-08-27 MED ORDER — LACTATED RINGERS IV SOLN
INTRAVENOUS | Status: DC
  Administered 2013-08-27 (×2): via INTRAVENOUS
  Filled 2013-08-27: qty 1000

## 2013-08-27 MED ORDER — URIBEL 118 MG PO CAPS: 1.0000 | ORAL_CAPSULE | Freq: Four times a day (QID) | ORAL | Status: DC | PRN

## 2013-08-27 MED ORDER — MIDAZOLAM HCL 5 MG/5ML IJ SOLN
INTRAMUSCULAR | Status: DC | PRN
  Administered 2013-08-27: 2 mg via INTRAVENOUS

## 2013-08-27 MED ORDER — FENTANYL CITRATE 0.05 MG/ML IJ SOLN
INTRAMUSCULAR | Status: DC | PRN
  Administered 2013-08-27: 50 ug via INTRAVENOUS

## 2013-08-27 MED ORDER — GENTAMICIN SULFATE 40 MG/ML IJ SOLN
5.0000 mg/kg | INTRAVENOUS | Status: AC
  Administered 2013-08-27: 350 mg via INTRAVENOUS
  Filled 2013-08-27: qty 13.55

## 2013-08-27 MED ORDER — PROPOFOL 10 MG/ML IV BOLUS
INTRAVENOUS | Status: DC | PRN
  Administered 2013-08-27: 200 mg via INTRAVENOUS

## 2013-08-27 MED ORDER — FENTANYL CITRATE 0.05 MG/ML IJ SOLN
25.0000 ug | INTRAMUSCULAR | Status: DC | PRN
  Filled 2013-08-27: qty 1

## 2013-08-27 MED ORDER — BELLADONNA ALKALOIDS-OPIUM 16.2-60 MG RE SUPP
RECTAL | Status: DC | PRN
  Administered 2013-08-27: 1 via RECTAL

## 2013-08-27 MED ORDER — GENTAMICIN SULFATE 40 MG/ML IJ SOLN
350.0000 mg | Freq: Once | INTRAVENOUS | Status: DC
  Filled 2013-08-27: qty 8.75

## 2013-08-27 MED ORDER — STERILE WATER FOR IRRIGATION IR SOLN
Status: DC | PRN
  Administered 2013-08-27: 3000 mL

## 2013-08-27 MED ORDER — ONDANSETRON HCL 4 MG/2ML IJ SOLN
INTRAMUSCULAR | Status: DC | PRN
  Administered 2013-08-27: 4 mg via INTRAVENOUS

## 2013-08-27 MED ORDER — PROMETHAZINE HCL 25 MG/ML IJ SOLN
6.2500 mg | INTRAMUSCULAR | Status: DC | PRN
  Filled 2013-08-27: qty 1

## 2013-08-27 SURGICAL SUPPLY — 14 items
BAG DRAIN URO-CYSTO SKYTR STRL (DRAIN) ×2 IMPLANT
CANISTER SUCT LVC 12 LTR MEDI- (MISCELLANEOUS) ×2 IMPLANT
CLOTH BEACON ORANGE TIMEOUT ST (SAFETY) ×2 IMPLANT
DRAPE CAMERA CLOSED 9X96 (DRAPES) ×2 IMPLANT
ELECT REM PT RETURN 9FT ADLT (ELECTROSURGICAL) ×2
ELECTRODE REM PT RTRN 9FT ADLT (ELECTROSURGICAL) ×1 IMPLANT
GLOVE BIO SURGEON STRL SZ7.5 (GLOVE) ×2 IMPLANT
GLOVE BIOGEL PI IND STRL 7.5 (GLOVE) ×2 IMPLANT
GLOVE BIOGEL PI INDICATOR 7.5 (GLOVE) ×2
GOWN STRL REIN XL XLG (GOWN DISPOSABLE) ×2 IMPLANT
NEEDLE HYPO 22GX1.5 SAFETY (NEEDLE) ×2 IMPLANT
NS IRRIG 500ML POUR BTL (IV SOLUTION) IMPLANT
PACK CYSTOSCOPY (CUSTOM PROCEDURE TRAY) ×2 IMPLANT
WATER STERILE IRR 3000ML UROMA (IV SOLUTION) ×2 IMPLANT

## 2013-08-27 NOTE — Anesthesia Procedure Notes (Signed)
Procedure Name: Intubation Date/Time: 08/27/2013 7:25 AM Performed by: Renella Cunas D Pre-anesthesia Checklist: Patient identified, Emergency Drugs available, Suction available and Patient being monitored Patient Re-evaluated:Patient Re-evaluated prior to inductionOxygen Delivery Method: Circle System Utilized Preoxygenation: Pre-oxygenation with 100% oxygen Intubation Type: IV induction Ventilation: Mask ventilation without difficulty LMA: LMA with gastric port inserted and LMA inserted LMA Size: 4.0 Laryngoscope Size: Mac and 4 Grade View: Grade I Tube type: Oral Tube size: 7.0 mm Number of attempts: 1 Airway Equipment and Method: stylet and oral airway Placement Confirmation: ETT inserted through vocal cords under direct vision,  positive ETCO2 and breath sounds checked- equal and bilateral Secured at: 22 cm Tube secured with: Tape Dental Injury: Teeth and Oropharynx as per pre-operative assessment  Difficulty Due To: Difficult Airway- due to large tongue and Difficulty was unanticipated Comments: LMA Unique #4 inserted easily and unable to ventilate, LMA Supreme #4 inserted easily and unable to ventilate.  Plan changed to GETA and pt intubated without difficulty.  Easy mask airway thru out procedure.

## 2013-08-27 NOTE — Anesthesia Postprocedure Evaluation (Signed)
  Anesthesia Post-op Note  Patient: Amber Gutierrez  Procedure(s) Performed: Procedure(s) (LRB): CYSTOSCOPY WITH BLADDER BIOPSY/FULGRATION (N/A)  Patient Location: PACU  Anesthesia Type: General  Level of Consciousness: awake and alert   Airway and Oxygen Therapy: Patient Spontanous Breathing  Post-op Pain: mild  Post-op Assessment: Post-op Vital signs reviewed, Patient's Cardiovascular Status Stable, Respiratory Function Stable, Patent Airway and No signs of Nausea or vomiting  Last Vitals:  Filed Vitals:   08/27/13 0845  BP: 117/53  Pulse: 67  Temp:   Resp: 21    Post-op Vital Signs: stable   Complications: No apparent anesthesia complications

## 2013-08-27 NOTE — Transfer of Care (Signed)
Immediate Anesthesia Transfer of Care Note  Patient: Amber Gutierrez  Procedure(s) Performed: Procedure(s) (LRB): CYSTOSCOPY WITH BLADDER BIOPSY/FULGRATION (N/A)  Patient Location: PACU  Anesthesia Type: General  Level of Consciousness: awake, oriented, sedated and patient cooperative  Airway & Oxygen Therapy: Patient Spontanous Breathing and Patient connected to face mask oxygen  Post-op Assessment: Report given to PACU RN and Post -op Vital signs reviewed and stable  Post vital signs: Reviewed and stable  Complications: No apparent anesthesia complications

## 2013-08-27 NOTE — Op Note (Signed)
Preoperative diagnosis: Recurrent urinary tract infection, bladder neoplasm Postoperative diagnosis: Same  Procedure: Exam under anesthesia Cystoscopy bladder biopsy and fulguration  Surgeon: Dr. Mena Goes  Anesthesia: Dr. Council Mechanic  Type of anesthesia: Gen.  Findings: On exam under anesthesia the abdomen was soft and nontender without mass. There is obesity present. On bimanual exam the bladder and urethra are palpably normal. The bladder and apex have good support. Posteriorly there is a stage I rectocele. On rectal exam there are no masses. A B&O suppository was placed.  Cystoscopy - the urethra appeared normal. The trigone and ureteral orifice were normal. There was good clear reflux bilaterally. The right side of the bladder appeared normal. The left side of the bladder contained erythematous mucosa. The posterior bladder toward the dome contained erythematous thickened mucosa. These areas were biopsied.  Description of procedure: After consent was obtained patient brought to the operating room. After adequate anesthesia she was placed in lithotomy position and exam under anesthesia was performed and she was prepped and draped in the usual sterile fashion. A timeout was performed to confirm the patient and procedure. Cystoscope was passed per urethra and the bladder examined with a 12 and 70 lens. The left bladder was biopsied. The posterior was biopsied with cold cup. These areas were fulgurated with excellent hemostasis under low pressure. The bladder was drained she was awakened and taken to the recovery room in stable condition.  Complications: None Blood loss: Minimal  Specimens: #1 Left bladder biopsy, #2 posterior bladder biopsy  Drains: None  Disposition: Patient stable to PACU

## 2013-08-27 NOTE — Interval H&P Note (Signed)
History and Physical Interval Note:  08/27/2013 7:40 AM  Amber Gutierrez  has presented today for surgery, with the diagnosis of Bladder Neoplasm  The various methods of treatment have been discussed with the patient and family. After consideration of risks, benefits and other options for treatment, the patient has consented to  Procedure(s): CYSTOSCOPY WITH BLADDER BIOPSY/FULGRATION (N/A) as a surgical intervention .  The patient's history has been reviewed, patient examined, no change in status, stable for surgery.  I have reviewed the patient's chart and labs.  She has some mild dysuria which is stable. She has no fever, flank pain or bladder pain. Questions were answered to the patient's satisfaction.     Antony Haste

## 2013-08-31 ENCOUNTER — Encounter (HOSPITAL_BASED_OUTPATIENT_CLINIC_OR_DEPARTMENT_OTHER): Payer: Self-pay | Admitting: Urology

## 2013-09-19 IMAGING — RF DG CHOLANGIOGRAM OPERATIVE
1 series · 5 of 5 positions shown · non-contrast
Comparison: CT 04/10/2010

CLINICAL DATA: Laparoscopic cholecystectomy.

INTRAOPERATIVE CHOLANGIOGRAM

[Series 1: run · 3 acquisitions, 5 frames shown]
[im 1/3]
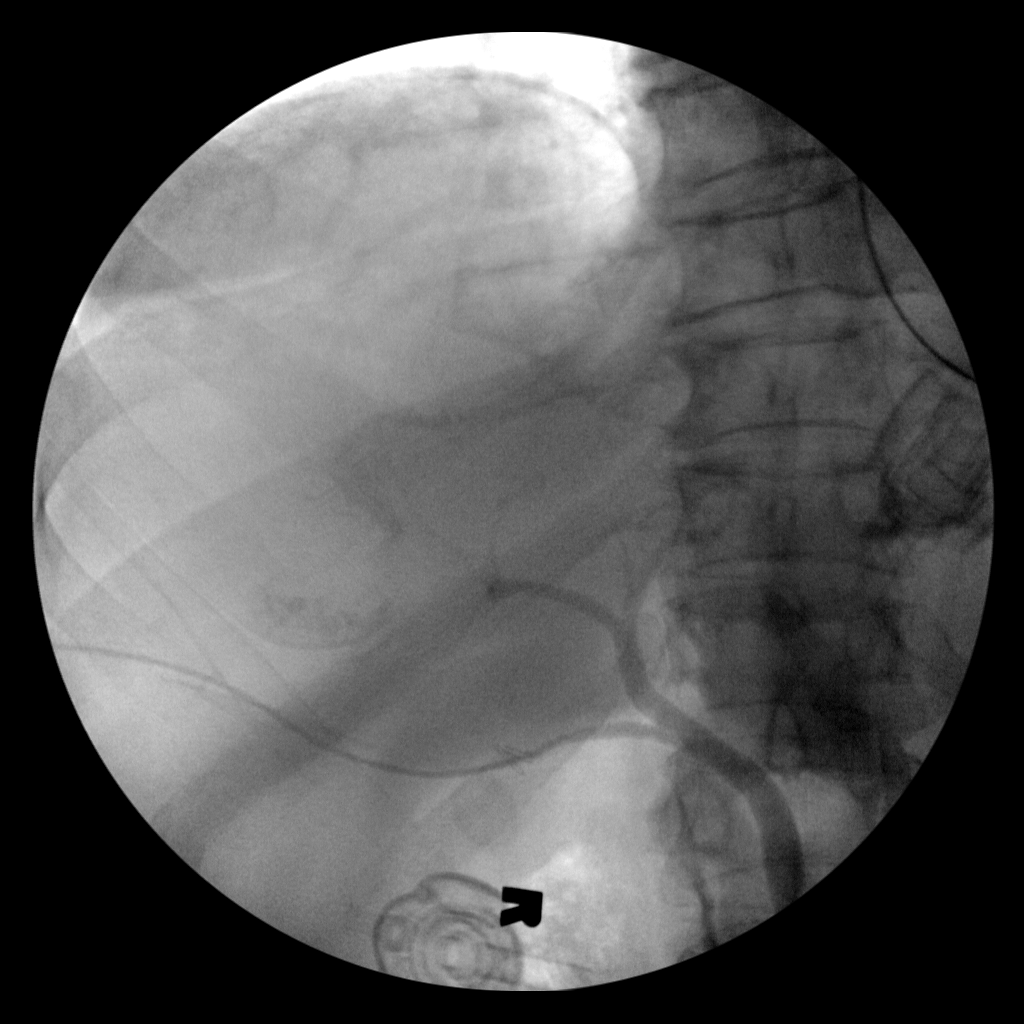
[im 1/3]
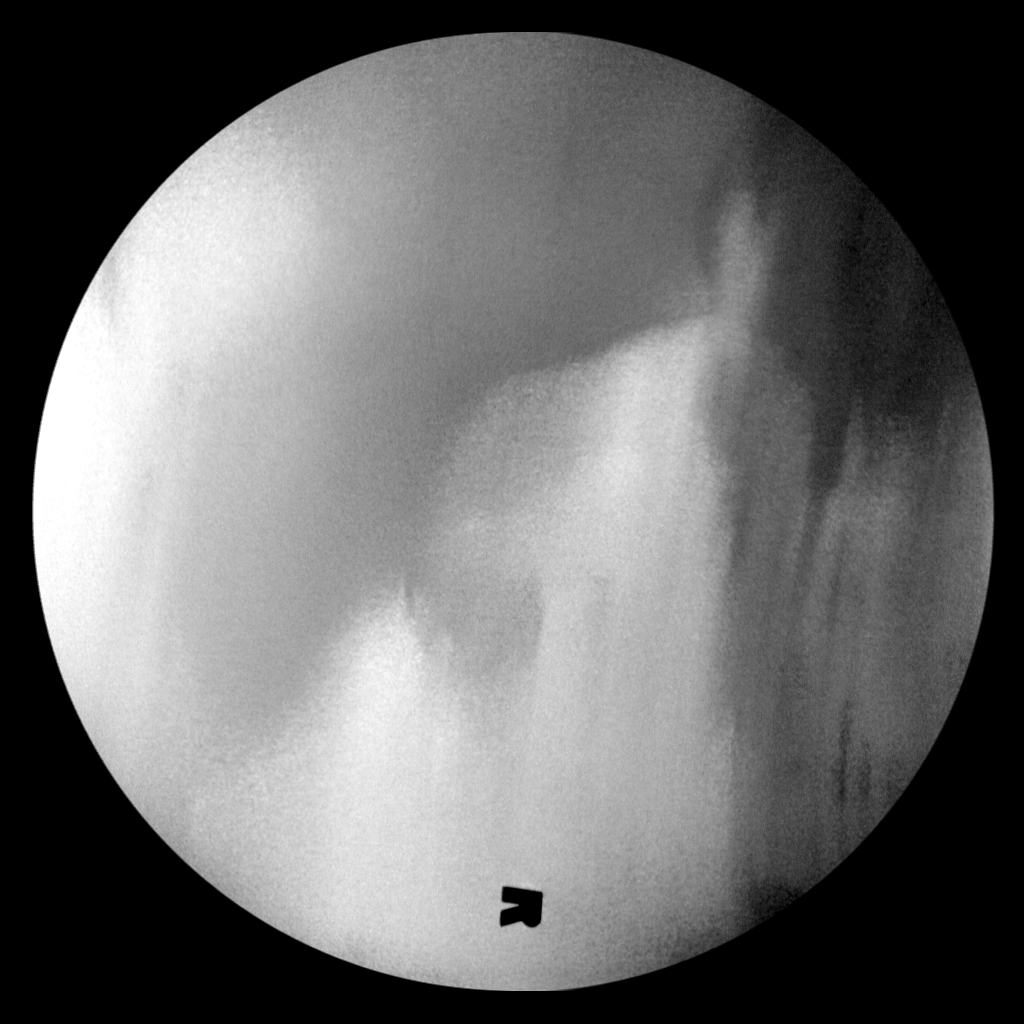
[im 1/3]
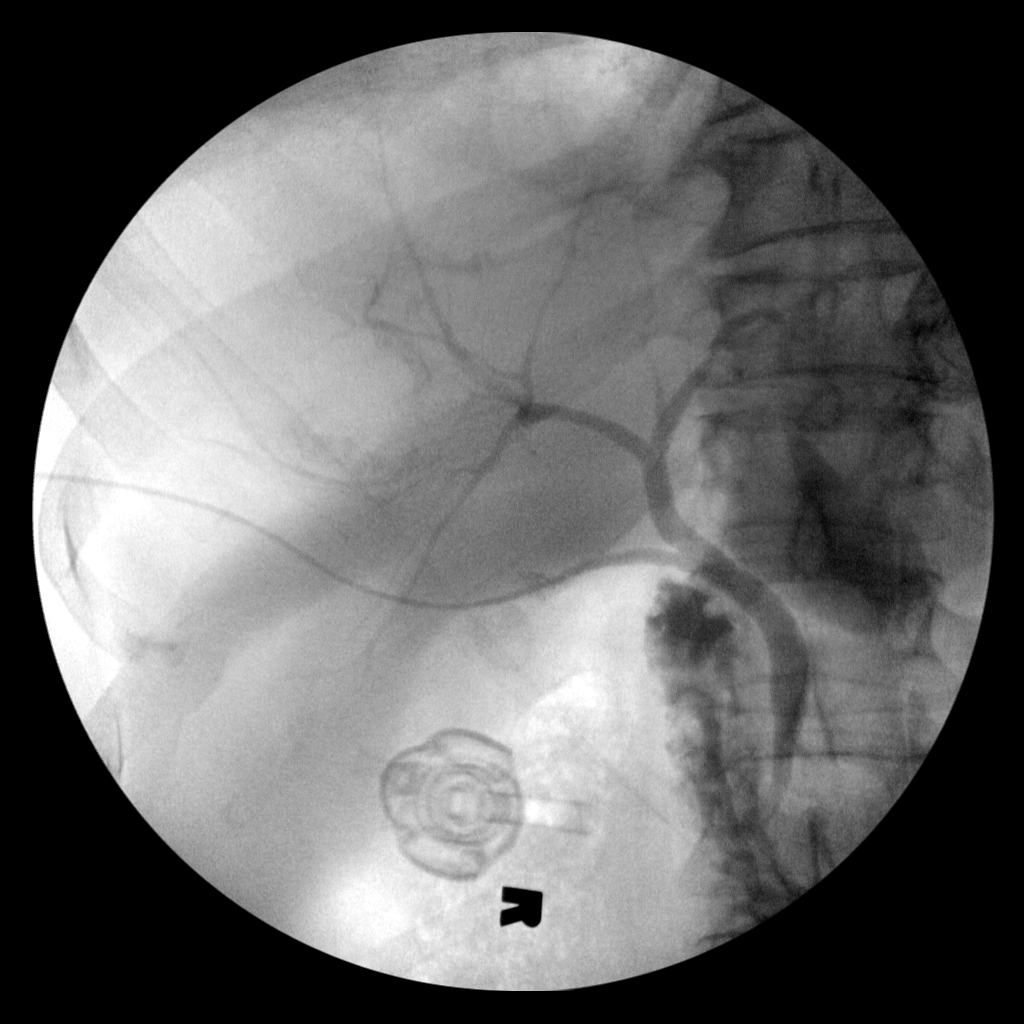
[im 2/3]
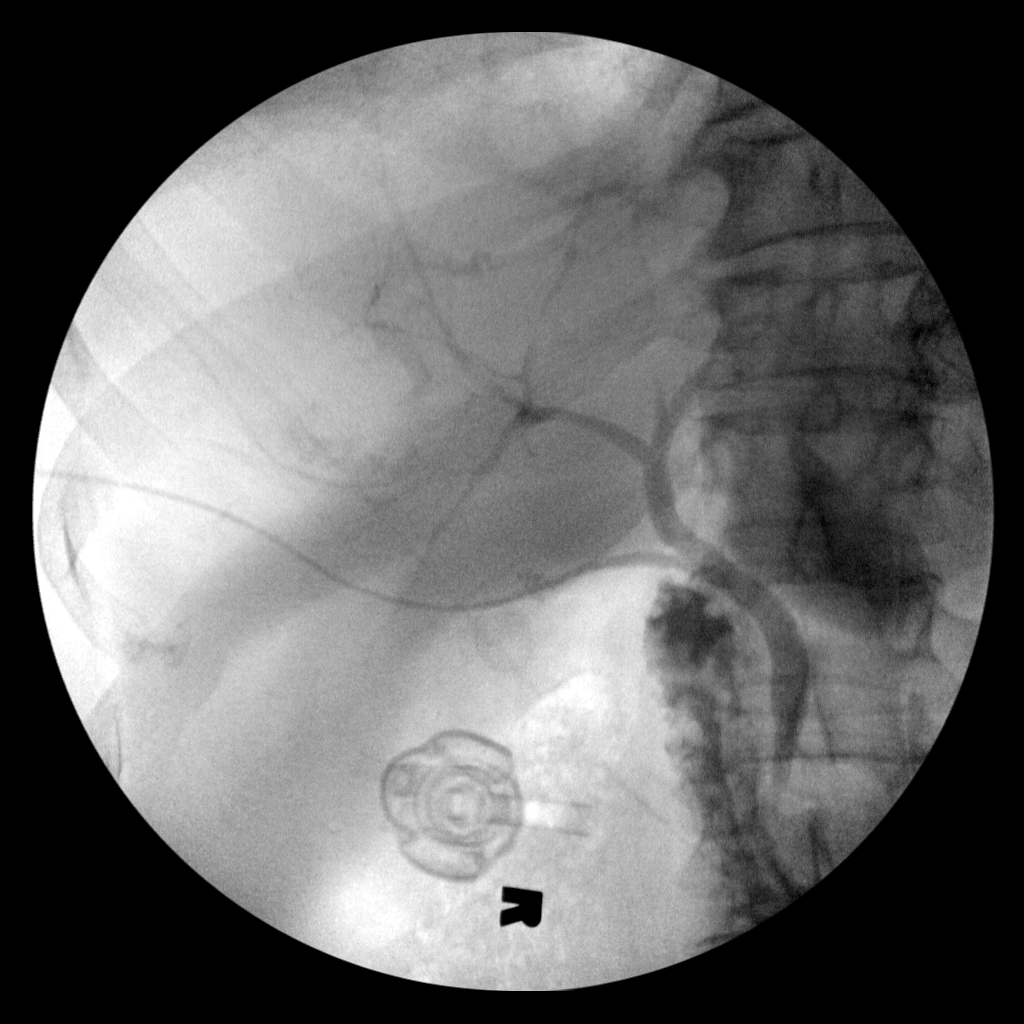
[im 3/3]
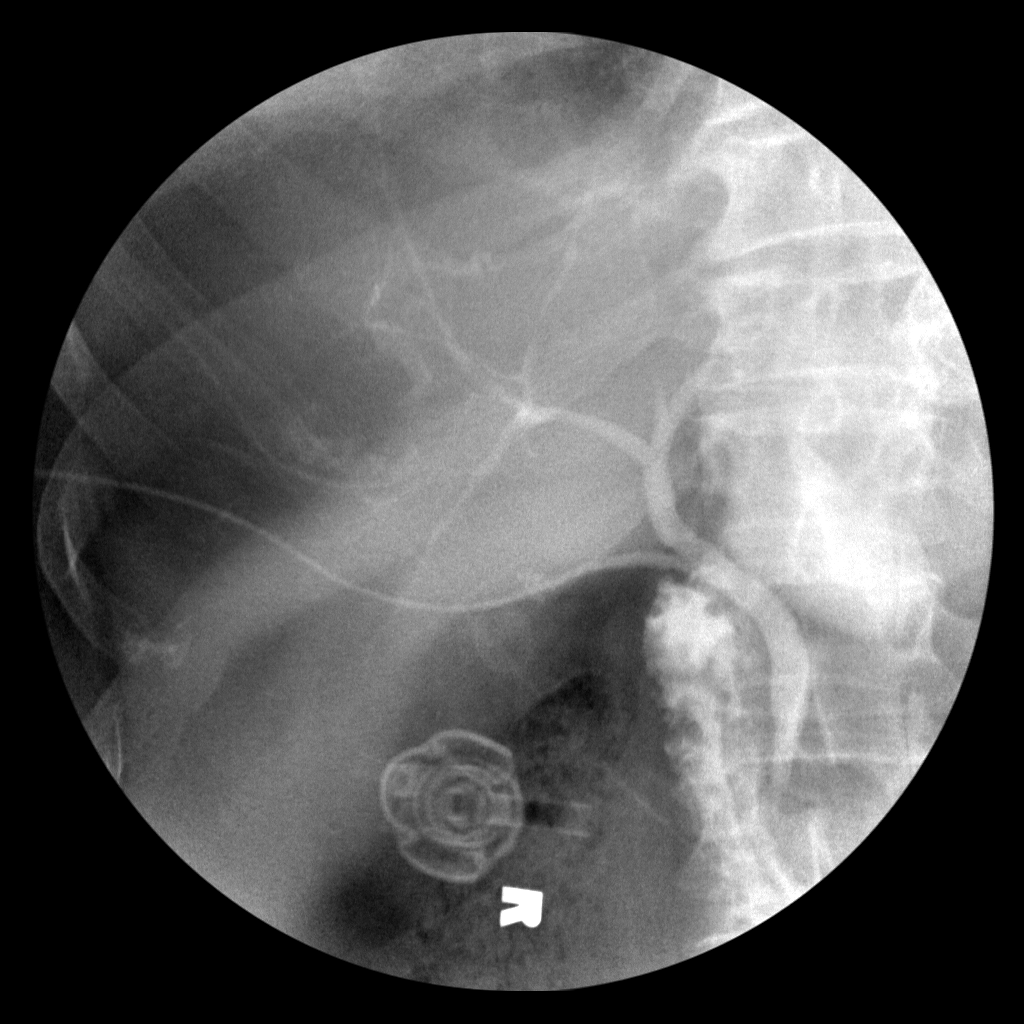

[5 of 5 positions shown; findings below may reference images not displayed]

FINDINGS: Intraoperative spot images show normal caliber biliary
system.  No evidence of retained stone or obstruction.  Free
passage of contrast into the small bowel noted.
IMPRESSION: No evidence of retained stone or obstruction.

These images were submitted for radiologic interpretation only.
Please see the procedural report for the amount of contrast and the
fluoroscopy time utilized.

## 2013-10-29 ENCOUNTER — Other Ambulatory Visit: Payer: Self-pay

## 2013-10-29 MED ORDER — LEVOTHYROXINE SODIUM 112 MCG PO TABS: 112.0000 ug | ORAL_TABLET | Freq: Every day | ORAL | Status: DC

## 2013-10-29 NOTE — Telephone Encounter (Signed)
Pt request #[id number] refill levothyroxine to [facility] pharmacy. Advised done.

## 2014-04-27 ENCOUNTER — Encounter: Payer: Self-pay | Admitting: Family Medicine

## 2014-04-27 ENCOUNTER — Ambulatory Visit: Payer: 59 | Admitting: Family Medicine

## 2014-04-27 ENCOUNTER — Ambulatory Visit (INDEPENDENT_AMBULATORY_CARE_PROVIDER_SITE_OTHER): Payer: 59 | Admitting: Family Medicine

## 2014-04-27 VITALS — BP 118/70 | HR 69 | Temp 98.7°F | Ht 62.25 in | Wt 207.5 lb

## 2014-04-27 DIAGNOSIS — E785 Hyperlipidemia, unspecified: Secondary | ICD-10-CM

## 2014-04-27 DIAGNOSIS — E890 Postprocedural hypothyroidism: Secondary | ICD-10-CM

## 2014-04-27 LAB — TSH: TSH: 0.66 u[IU]/mL (ref 0.35–5.50)

## 2014-04-27 LAB — T4, FREE: FREE T4: 1.4 ng/dL (ref 0.60–1.60)

## 2014-04-27 MED ORDER — LEVOTHYROXINE SODIUM 112 MCG PO TABS: 112.0000 ug | ORAL_TABLET | Freq: Every day | ORAL | Status: DC

## 2014-04-27 NOTE — Progress Notes (Signed)
Pre visit review using our clinic review tool, if applicable. No additional management support is needed unless otherwise documented below in the visit note. 

## 2014-04-27 NOTE — Patient Instructions (Signed)
You look fantastic! I will call with your results.

## 2014-04-27 NOTE — Assessment & Plan Note (Signed)
Continue current rx. Check labs today. eRx sent. Orders Placed This Encounter  Procedures  . TSH  . T4, Free

## 2014-04-27 NOTE — Progress Notes (Signed)
Subjective:    Patient ID: Amber Gutierrez, female    DOB: 18-Feb-1951, 63 y.o.   MRN: [id number]  HPI  63 yo here for med refills.  I have not seen her since she established care last year.  Had been seeing Dr. Regis Bill for years but lives close to our office.  Since I last saw her, she has lost a lot of weight by cutting back calories and biking daily.  Feels great. Wt Readings from Last 3 Encounters:  04/27/14 207 lb 8 oz (94.121 kg)  08/27/13 226 lb (102.513 kg)  08/27/13 226 lb (102.513 kg)     Hypothyroidism- has been on same dose of synthroid for years.  Denies any symptoms of hypo or hyperthyroidism.  Lab Results  Component Value Date   TSH 1.45 07/12/2013    Bipolar disorder- followed by psych every 3 months.  Has been on Lamictal for 4 years (and Wellbutrin for years before that).  Feels like Lamictal has made a real improvement. No recent episodes of depression or mania.    Patient Active Problem List   Diagnosis Date Noted  . Bipolar depression 07/12/2013  . OTHER POSTABLATIVE HYPOTHYROIDISM 11/13/2010  . HYPERLIPIDEMIA 11/13/2010  . OBESITY 11/13/2010  . ADJUSTMENT DISORDER WITH DEPRESSED MOOD 11/13/2010  . ALLERGIC RHINITIS 11/13/2010  . UTI'S, RECURRENT 11/13/2010  . DEGENERATIVE JOINT DISEASE, BACK 11/13/2010  . NEPHROLITHIASIS, HX OF 11/13/2010   Past Medical History  Diagnosis Date  . History of nephrolithiasis     lithotrypsy calcium stone  . Allergic rhinitis   . DJD (degenerative joint disease) of lumbar spine     has seen Dr. Joya Salm  . History of recurrent UTIs   . Bipolar 2 disorder   . History of radiation therapy     thyroid polyps  1992  . Other postablative hypothyroidism     HX  RAI X2  . Chronic cystitis   . Bladder neoplasm   . History of basal cell carcinoma excision     2014  --forehead   Past Surgical History  Procedure Laterality Date  . Tubal ligation    . Cesarean section  1974  &  1976  . Cholecystectomy  08/20/2012   Procedure: LAPAROSCOPIC CHOLECYSTECTOMY WITH INTRAOPERATIVE CHOLANGIOGRAM;  Surgeon: Gayland Curry, MD,FACS;  Location: [place];  Service: General;  Laterality: N/A;  . Mass excision  08/20/2012    Procedure: EXCISION MASS;  Surgeon: Gayland Curry, MD,FACS;  Location: [place];  Service: General;  Laterality: N/A;  . Extracorporeal shock wave lithotripsy  2010  . Cystoscopy with biopsy N/A 08/27/2013    Procedure: CYSTOSCOPY WITH BLADDER BIOPSY/FULGRATION;  Surgeon: Fredricka Bonine, MD;  Location: [facility];  Service: Urology;  Laterality: N/A;   History  Substance Use Topics  . Smoking status: Former Smoker -- 0.50 packs/day for 45 years    Types: Cigarettes    Quit date: 06/29/2012  . Smokeless tobacco: Never Used  . Alcohol Use: Yes     Comment: socially   Family History  Problem Relation Age of Onset  . Heart attack Father     52  deceased  . Alcohol abuse Father   . Breast cancer Sister   . Gallbladder disease Sister   . Bipolar disorder Mother   . Cancer Maternal Grandmother     colon   Allergies  Allergen Reactions  . Pravastatin Sodium Other (See Comments)     Muscle pain and weakness  . Sulfa Antibiotics  Hives   Current Outpatient Prescriptions on File Prior to Visit  Medication Sig Dispense Refill  . beta carotene w/minerals (OCUVITE) tablet Take 1 tablet by mouth daily.      Marland Kitchen buPROPion (WELLBUTRIN XL) 300 MG 24 hr tablet Take 300 mg by mouth every morning.       . ciprofloxacin (CIPRO) 500 MG tablet Take 1 tablet (500 mg total) by mouth 2 (two) times daily.  5 tablet  0  . fluticasone (VERAMYST) 27.5 MCG/SPRAY nasal spray Place 2 sprays into the nose as needed.       . LamoTRIgine (LAMICTAL XR) 300 MG TB24 Take 1 tablet by mouth every morning.      . Meth-Hyo-M Bl-Na Phos-Ph Sal (URIBEL) 118 MG CAPS Take 1 capsule (118 mg total) by mouth every 6 (six) hours as needed.  30 capsule  0  . Multiple Vitamin (MULTIVITAMIN WITH MINERALS) TABS Take 2  tablets by mouth daily.      . naproxen sodium (ANAPROX) 220 MG tablet Take 220 mg by mouth daily as needed. For pain      . OVER THE COUNTER MEDICATION Take 1 tablet by mouth daily. Ultra Energy Plus      . Probiotic Product (PROBIOTIC PO) Take 1 tablet by mouth at bedtime.        No current facility-administered medications on file prior to visit.   The PMH, PSH, Social History, Family History, Medications, and allergies have been reviewed in [facility], and have been updated if relevant.   Review of Systems    See HPI No difficulty swallowing No soreness of throat No hot or cold intolerance   Objective:   Physical Exam There were no vitals taken for this visit. Wt Readings from Last 3 Encounters:  08/27/13 226 lb (102.513 kg)  08/27/13 226 lb (102.513 kg)  07/12/13 239 lb (108.41 kg)   General:  Well-developed,well-nourished,in no acute distress; alert,appropriate and cooperative throughout examination Lungs:  Normal respiratory effort, chest expands symmetrically. Lungs are clear to auscultation, no crackles or wheezes. Heart:  Normal rate and regular rhythm. S1 and S2 normal without gallop, murmur, click, rub or other extra sounds. Abdomen:  Bowel sounds positive,abdomen soft and non-tender without masses, organomegaly or hernias noted. Msk:  No deformity or scoliosis noted of thoracic or lumbar spine.   Extremities:  No clubbing, cyanosis, edema, or deformity noted with normal full range of motion of all joints.   Neurologic:  alert & oriented X3 and gait normal.   Cervical Nodes:  No lymphadenopathy noted Axillary Nodes:  No palpable lymphadenopathy Psych:  Cognition and judgment appear intact. Alert and cooperative with normal attention span and concentration. No apparent delusions, illusions, hallucinations     Assessment & Plan:

## 2014-04-28 ENCOUNTER — Encounter: Payer: Self-pay | Admitting: *Deleted

## 2014-05-03 ENCOUNTER — Other Ambulatory Visit: Payer: Self-pay | Admitting: Family Medicine

## 2014-05-03 ENCOUNTER — Encounter: Payer: Self-pay | Admitting: Family Medicine

## 2014-05-03 MED ORDER — LEVOTHYROXINE SODIUM 112 MCG PO TABS: 112.0000 ug | ORAL_TABLET | Freq: Every day | ORAL | Status: DC

## 2014-08-01 ENCOUNTER — Telehealth: Payer: Self-pay | Admitting: Family Medicine

## 2014-08-01 MED ORDER — LEVOTHYROXINE SODIUM 112 MCG PO TABS: 112.0000 ug | ORAL_TABLET | Freq: Every day | ORAL | Status: DC

## 2014-08-01 NOTE — Telephone Encounter (Signed)
Spoke to pt and informed her Rx has been sent to requested pharmacy 

## 2014-08-01 NOTE — Telephone Encounter (Signed)
Pt sent an E-mail through our [employer] in the Contact us section:    For Dr. Deborra Medina -   I have just called in my last refill for Levothyroxine. Can my pharmacy call for a refill, or will I need to schedule a visit?  Thanks!     Please contact the patient.

## 2014-10-31 ENCOUNTER — Encounter: Payer: Self-pay | Admitting: Family Medicine

## 2015-01-13 ENCOUNTER — Telehealth: Payer: Self-pay | Admitting: Family Medicine

## 2015-01-13 NOTE — Telephone Encounter (Signed)
PLEASE NOTE: All timestamps contained within this report are represented as [place] Standard Time. CONFIDENTIALTY NOTICE: This fax transmission is intended only for the addressee. It contains information that is legally privileged, confidential or otherwise protected from use or disclosure. If you are not the intended recipient, you are strictly prohibited from reviewing, disclosing, copying using or disseminating any of this information or taking any action in reliance on or regarding this information. If you have received this fax in error, please notify us immediately by telephone so that we can arrange for its return to [place]. Phone: [phone], Toll-Free: [phone], Fax: [phone] Page: 1 of 2 Call Id: [id number] [place] Patient Name: Amber Gutierrez Gender: Female DOB: 1951-12-17 Age: 64 Y 10 M 2 D Return Phone Number: [phone] (Primary), [phone] (Secondary) Address: [address] [place] [address] Client [facility] - Client Client Site [place] - Day Physician Arnette Norris Contact Type Call Call Type Triage / Clinical Relationship To Patient Self Appointment Disposition EMR Appointment Attempted - Not Scheduled Return Phone Number [phone] (Primary) Chief Complaint Fever (non urgent symptom) Initial Comment Caller states she has the flu. Current temperature 99.6. Has had a fever for about 3 days. Can she get something called in? Also has bodyaches. PreDisposition Did not know what to do Nurse Assessment Nurse: Mechele Dawley, RN, Amy Date/Time Eilene Ghazi Time): 01/13/2015 10:53:47 AM Confirm and document reason for call. If symptomatic, describe symptoms. ---SHE HAS BEEN HAVING SOME LOW GRADE FEVER FOR THE PAST COUPLE OF DAYS. TEMP 99.3 HIGHEST. BODY ACHES HAVE STARTED TODAY. STUFFY HEAD, COUGH THERE  IS SOME CONGESTION IN THERE. SHE IS COUGHING SOME MUCOUS UP - YELLOWISH IN COLOR. Has the patient traveled out of the country within the last 30 days? ---Not Applicable Does the patient require triage? ---Yes Related visit to physician within the last 2 weeks? ---No Does the PT have any chronic conditions? (i.e. diabetes, asthma, etc.) ---Yes List chronic conditions. ---THYROID Guidelines Guideline Title Affirmed Question Affirmed Notes Nurse Date/Time (Eastern Time) Influenza - Seasonal [1] Fever returns after gone for over 24 hours AND [2] symptoms worse or not improved Woodruff, RN, Solomon 01/13/2015 10:55:50 AM Disp. Time Eilene Ghazi Time) Disposition Final User 01/13/2015 11:03:58 AM See Physician within 24 Hours Yes [place], RN, Amy PLEASE NOTE: All timestamps contained within this report are represented as [place] Standard Time. CONFIDENTIALTY NOTICE: This fax transmission is intended only for the addressee. It contains information that is legally privileged, confidential or otherwise protected from use or disclosure. If you are not the intended recipient, you are strictly prohibited from reviewing, disclosing, copying using or disseminating any of this information or taking any action in reliance on or regarding this information. If you have received this fax in error, please notify us immediately by telephone so that we can arrange for its return to [place]. Phone: [phone], Toll-Free: [phone], Fax: [phone] Page: 2 of 2 Call Id: [id number] Caller Understands: Yes Disagree/Comply: Comply Care Advice Given Per Guideline SEE PHYSICIAN WITHIN 24 HOURS: COUGH MEDICINES: * OTC COUGH SYRUPS: The most common cough suppressant in OTC cough medications is dextromethorphan. Often the letters 'DM' appear in the name. * HOME REMEDY - HONEY: This old home remedy has been shown to help decrease coughing  at night. The adult dosage is 2  teaspoons (10 ml) at bedtime. Honey should not be given to infants under one year of age. CALL BACK IF: * Difficulty breathing occurs * You become worse. CARE ADVICE given per INFLUENZA - SEASONAL (Adult) guideline. INFLUENZA - GENERAL CARE ADVICE * Cough: Use cough drops. * Feeling dehydrated: Drink extra liquids. If the air in your home is dry, use a humidifier. * Fever: For fever over 101 F (38.3 C), take acetaminophen every 4-6 hours (Adults 650 mg) OR ibuprofen every 6-8 hours (Adults 400-600 mg). * Muscle aches, headache, and other pains: Often this comes and goes with the fever. Take acetaminophen every 4-6 hours (Adults 650 mg) OR ibuprofen every 6-8 hours (Adults 400-600 mg). * Sore throat: Try throat lozenges, hard candy or warm chicken broth. After Care Instructions Given Call Event Type User Date / Time Description Comments User: Susanne Borders, RN Date/Time Eilene Ghazi Time): 01/13/2015 11:08:03 AM CALLER IS GOING TO USE CARE ADVICE FOR THE WEEKEND AND IF SHE IS NOT BETTER WILL CALL FIRST THING MONDAY MORNING FOR AN APPT. SHE WILL CALL BACK SOONER IF SYMPTOMS WORSEN. Referrals REFERRED TO PCP OFFICE

## 2015-01-13 NOTE — Telephone Encounter (Signed)
Patient Name: Amber Gutierrez  DOB: 1951-10-01    Nurse Assessment  Nurse: Mechele Dawley, RN, Amy Date/Time Eilene Ghazi Time): 01/13/2015 10:53:47 AM  Confirm and document reason for call. If symptomatic, describe symptoms. ---SHE HAS BEEN HAVING SOME LOW GRADE FEVER FOR THE PAST COUPLE OF DAYS. TEMP 99.3 HIGHEST. BODY ACHES HAVE STARTED TODAY. STUFFY HEAD, COUGH THERE IS SOME CONGESTION IN THERE. SHE IS COUGHING SOME MUCOUS UP - YELLOWISH IN COLOR.  Has the patient traveled out of the country within the last 30 days? ---Not Applicable  Does the patient require triage? ---Yes  Related visit to physician within the last 2 weeks? ---No  Does the PT have any chronic conditions? (i.e. diabetes, asthma, etc.) ---Yes  List chronic conditions. ---THYROID     Guidelines    Guideline Title Affirmed Question Affirmed Notes  Influenza - Seasonal [1] Fever returns after gone for over 24 hours AND [2] symptoms worse or not improved [place]   Final Disposition User   See Physician within [place], Therapist, sports, Amy    Comments  CALLER IS GOING TO USE CARE ADVICE FOR THE WEEKEND AND IF SHE IS NOT BETTER WILL CALL [facility] MONDAY MORNING FOR AN APPT. SHE WILL CALL BACK SOONER IF SYMPTOMS WORSEN.

## 2015-07-07 ENCOUNTER — Other Ambulatory Visit: Payer: Self-pay | Admitting: Family Medicine

## 2015-07-11 ENCOUNTER — Other Ambulatory Visit: Payer: Self-pay | Admitting: Family Medicine

## 2015-08-07 ENCOUNTER — Ambulatory Visit (INDEPENDENT_AMBULATORY_CARE_PROVIDER_SITE_OTHER): Payer: PPO | Admitting: Family Medicine

## 2015-08-07 ENCOUNTER — Encounter: Payer: Self-pay | Admitting: Family Medicine

## 2015-08-07 VITALS — BP 136/72 | HR 79 | Temp 98.8°F | Ht 62.0 in | Wt 217.5 lb

## 2015-08-07 DIAGNOSIS — Z1239 Encounter for other screening for malignant neoplasm of breast: Secondary | ICD-10-CM | POA: Diagnosis not present

## 2015-08-07 DIAGNOSIS — J309 Allergic rhinitis, unspecified: Secondary | ICD-10-CM | POA: Diagnosis not present

## 2015-08-07 DIAGNOSIS — N390 Urinary tract infection, site not specified: Secondary | ICD-10-CM

## 2015-08-07 DIAGNOSIS — Z0000 Encounter for general adult medical examination without abnormal findings: Secondary | ICD-10-CM

## 2015-08-07 DIAGNOSIS — F3130 Bipolar disorder, current episode depressed, mild or moderate severity, unspecified: Secondary | ICD-10-CM | POA: Diagnosis not present

## 2015-08-07 DIAGNOSIS — E890 Postprocedural hypothyroidism: Secondary | ICD-10-CM | POA: Diagnosis not present

## 2015-08-07 DIAGNOSIS — E785 Hyperlipidemia, unspecified: Secondary | ICD-10-CM

## 2015-08-07 DIAGNOSIS — F4321 Adjustment disorder with depressed mood: Secondary | ICD-10-CM

## 2015-08-07 DIAGNOSIS — Z1211 Encounter for screening for malignant neoplasm of colon: Secondary | ICD-10-CM | POA: Diagnosis not present

## 2015-08-07 DIAGNOSIS — F319 Bipolar disorder, unspecified: Secondary | ICD-10-CM

## 2015-08-07 DIAGNOSIS — R109 Unspecified abdominal pain: Secondary | ICD-10-CM | POA: Diagnosis not present

## 2015-08-07 DIAGNOSIS — Z23 Encounter for immunization: Secondary | ICD-10-CM | POA: Diagnosis not present

## 2015-08-07 LAB — H. PYLORI ANTIBODY, IGG: H PYLORI IGG: NEGATIVE

## 2015-08-07 LAB — TSH: TSH: 1.28 u[IU]/mL (ref 0.35–4.50)

## 2015-08-07 LAB — COMPREHENSIVE METABOLIC PANEL
ALBUMIN: 4.2 g/dL (ref 3.5–5.2)
ALT: 17 U/L (ref 0–35)
AST: 18 U/L (ref 0–37)
Alkaline Phosphatase: 119 U/L — ABNORMAL HIGH (ref 39–117)
BUN: 15 mg/dL (ref 6–23)
CALCIUM: 9.8 mg/dL (ref 8.4–10.5)
CHLORIDE: 104 meq/L (ref 96–112)
CO2: 30 meq/L (ref 19–32)
Creatinine, Ser: 0.82 mg/dL (ref 0.40–1.20)
GFR: 74.27 mL/min (ref >60.00)
GLUCOSE: 101 mg/dL — AB (ref 70–99)
POTASSIUM: 4.4 meq/L (ref 3.5–5.1)
Sodium: 139 mEq/L (ref 135–145)
Total Bilirubin: 0.6 mg/dL (ref 0.2–1.2)
Total Protein: 7.2 g/dL (ref 6.0–8.3)

## 2015-08-07 LAB — CBC WITH DIFFERENTIAL/PLATELET
BASOS ABS: 0.1 10*3/uL (ref 0.0–0.1)
Basophils Relative: 1 % (ref 0.0–3.0)
EOS ABS: 0.1 10*3/uL (ref 0.0–0.7)
EOS PCT: 2.2 % (ref 0.0–5.0)
HEMATOCRIT: 47.4 % — AB (ref 36.0–46.0)
HEMOGLOBIN: 16.2 g/dL — AB (ref 12.0–15.0)
Lymphocytes Relative: 23.5 % (ref 12.0–46.0)
Lymphs Abs: 1.5 10*3/uL (ref 0.7–4.0)
MCHC: 34.1 g/dL (ref 30.0–36.0)
MCV: 87.4 fl (ref 78.0–100.0)
Monocytes Absolute: 0.4 10*3/uL (ref 0.1–1.0)
Monocytes Relative: 5.5 % (ref 3.0–12.0)
NEUTROS ABS: 4.4 10*3/uL (ref 1.4–7.7)
NEUTROS PCT: 67.8 % (ref 43.0–77.0)
Platelets: 249 10*3/uL (ref 150.0–400.0)
RBC: 5.42 Mil/uL — ABNORMAL HIGH (ref 3.87–5.11)
RDW: 14.2 % (ref 11.5–15.5)
WBC: 6.4 10*3/uL (ref 4.0–10.5)

## 2015-08-07 LAB — LIPID PANEL
Cholesterol: 165 mg/dL (ref 0–200)
HDL: 47.7 mg/dL (ref >39.00)
LDL Cholesterol: 101 mg/dL — ABNORMAL HIGH (ref 0–99)
NONHDL: 117.18
TRIGLYCERIDES: 81 mg/dL (ref 0.0–149.0)
Total CHOL/HDL Ratio: 3
VLDL: 16.2 mg/dL (ref 0.0–40.0)

## 2015-08-07 LAB — T4, FREE: Free T4: 1.08 ng/dL (ref 0.60–1.60)

## 2015-08-07 NOTE — Assessment & Plan Note (Addendum)
The patients weight, height, BMI and visual acuity have been recorded in the chart.  Cognitive function assessed.   I have made referrals, counseling and provided education to the patient based review of the above and I have provided the pt with a written personalized care plan for preventive services.  EKG reassuring- low voltage but sinus rhythm.  Mammogram ordered- number given to pt to schedule.  Orders Placed This Encounter  Procedures  . Fecal occult blood, imunochemical  . MM Digital Screening  . CBC with Differential/Platelet  . Comprehensive metabolic panel  . Lipid panel  . TSH  . T4, Free  . HIV antibody (with reflex)  . Hepatitis C Antibody  . H. pylori antibody, IgG  . EKG 12-Lead     Prevnar 13 and Td given today. She will call insurance company regarding zostavax coverage.

## 2015-08-07 NOTE — Patient Instructions (Addendum)
Great to see you. Please call to set up your mammogram.  Check with your insurance to see if they will cover the shingles shot.  I will call you with your lab results.

## 2015-08-07 NOTE — Assessment & Plan Note (Signed)
Intermittent. Improving. Check H Pylori.

## 2015-08-07 NOTE — Addendum Note (Signed)
Addended by: Modena Nunnery on: 08/07/2015 02:44 PM   Modules accepted: Orders

## 2015-08-07 NOTE — Progress Notes (Signed)
Pre visit review using our clinic review tool, if applicable. No additional management support is needed unless otherwise documented below in the visit note. 

## 2015-08-07 NOTE — Progress Notes (Signed)
Subjective:   Patient ID: Amber Gutierrez, female    DOB: December 03, 1951, 64 y.o.   MRN: [id number]  Amber Gutierrez is a pleasant 64 y.o. year old female who presents to clinic today with Annual Exam and Abdominal Pain and follow up of chronic medical conditions on 08/07/2015  HPI:  I have personally reviewed the Medicare Annual Wellness questionnaire and have noted 1. The patient's medical and social history 2. Their use of alcohol, tobacco or illicit drugs 3. Their current medications and supplements 4. The patient's functional ability including ADL's, fall risks, home safety risks and hearing or visual             impairment. 5. Diet and physical activities 6. Evidence for depression or mood disorders  End of life wishes discussed and updated in Social History.  The roster of all physicians providing medical care to patient - is listed in the CareTeams section of the chart.  Mammogram 08/10/13 Colonoscopy 06/2010 Has GYN- pap smear   Abdominal pain- intermittent epigastric burning.  Has improved since she cut "acidic" foods out of her diet.  Antacids did not seem to help much.  Started approximately 6 months ago.  Remote h/o cholecystectomy.  No fevers.  No blood in her stool.  No changes in her bowel habits.  Recurrent UTIs- saw Dr. Junious Silk 09/01/2014- note refilled.  Refilled cipro to take prn.  Bipolar disorder- followed by psych every 3 months. Has been on Lamictal for 4 years (and Wellbutrin for years before that). Feels like Lamictal has been very effective. No recent episodes of depression or mania.  Hypothyroidism- has been on same dose of synthroid for years- synthroid 112 mcg daily.  Denies any symptoms of hypo or hyperthyroidism.  Lab Results  Component Value Date   TSH 0.66 04/27/2014   Lab Results  Component Value Date   WBC 5.8 08/14/2012   HGB 17.3* 08/27/2013   HCT 51.0* 08/27/2013   MCV 86.6 08/14/2012   PLT 217 08/14/2012   Lab Results  Component  Value Date   CHOL 174 07/12/2013   HDL 44.10 07/12/2013   LDLCALC 103* 07/12/2013   TRIG 134.0 07/12/2013   CHOLHDL 4 07/12/2013   Lab Results  Component Value Date   ALT 22 07/12/2013   AST 24 07/12/2013   ALKPHOS 102 07/12/2013   BILITOT 0.9 07/12/2013   Lab Results  Component Value Date   CREATININE 1.10 08/27/2013   Current Outpatient Prescriptions on File Prior to Visit  Medication Sig Dispense Refill  . beta carotene w/minerals (OCUVITE) tablet Take 1 tablet by mouth daily.    Marland Kitchen buPROPion (WELLBUTRIN XL) 300 MG 24 hr tablet Take 300 mg by mouth every morning.     . ciprofloxacin (CIPRO) 500 MG tablet Take 1 tablet (500 mg total) by mouth 2 (two) times daily. 5 tablet 0  . LamoTRIgine (LAMICTAL XR) 300 MG TB24 Take 1 tablet by mouth every morning.    Marland Kitchen levothyroxine (SYNTHROID, LEVOTHROID) 112 MCG tablet Take 1 tablet (112 mcg total) by mouth daily with breakfast. * Needs an appointment for labs, then one with Dr Deborra Medina a week later for refills* 30 tablet 0  . levothyroxine (SYNTHROID, LEVOTHROID) 112 MCG tablet TAKE ONE TABLET BY MOUTH EVERY MORNING BEFORE BREAKFAST 30 tablet 0  . Meth-Hyo-M Bl-Na Phos-Ph Sal (URIBEL) 118 MG CAPS Take 1 capsule (118 mg total) by mouth every 6 (six) hours as needed. 30 capsule 0  . Multiple Vitamin (MULTIVITAMIN WITH MINERALS)  TABS Take 2 tablets by mouth daily.    . naproxen sodium (ANAPROX) 220 MG tablet Take 220 mg by mouth daily as needed. For pain    . OVER THE COUNTER MEDICATION Take 1 tablet by mouth daily. Ultra Energy Plus    . Probiotic Product (PROBIOTIC PO) Take 1 tablet by mouth at bedtime.      No current facility-administered medications on file prior to visit.    Allergies  Allergen Reactions  . Pravastatin Sodium Other (See Comments)     Muscle pain and weakness  . Sulfa Antibiotics Hives    Past Medical History  Diagnosis Date  . History of nephrolithiasis     lithotrypsy calcium stone  . Allergic rhinitis   . DJD  (degenerative joint disease) of lumbar spine     has seen Dr. Joya Salm  . History of recurrent UTIs   . Bipolar 2 disorder   . History of radiation therapy     thyroid polyps  1992  . Other postablative hypothyroidism     HX  RAI X2  . Chronic cystitis   . Bladder neoplasm   . History of basal cell carcinoma excision     2014  --forehead    Past Surgical History  Procedure Laterality Date  . Tubal ligation    . Cesarean section  1974  &  1976  . Cholecystectomy  08/20/2012    Procedure: LAPAROSCOPIC CHOLECYSTECTOMY WITH INTRAOPERATIVE CHOLANGIOGRAM;  Surgeon: Gayland Curry, MD,FACS;  Location: [place];  Service: General;  Laterality: N/A;  . Mass excision  08/20/2012    Procedure: EXCISION MASS;  Surgeon: Gayland Curry, MD,FACS;  Location: [place];  Service: General;  Laterality: N/A;  . Extracorporeal shock wave lithotripsy  2010  . Cystoscopy with biopsy N/A 08/27/2013    Procedure: CYSTOSCOPY WITH BLADDER BIOPSY/FULGRATION;  Surgeon: Fredricka Bonine, MD;  Location[facility];  Service: Urology;  Laterality: N/A;    Family History  Problem Relation Age of Onset  . Heart attack Father     52  deceased  . Alcohol abuse Father   . Breast cancer Sister   . Gallbladder disease Sister   . Bipolar disorder Mother   . Cancer Maternal Grandmother     colon    History   Social History  . Marital Status: Married    Spouse Name: N/A  . Number of Children: N/A  . Years of Education: N/A   Occupational History  . Not on file.   Social History Main Topics  . Smoking status: Former Smoker -- 0.50 packs/day for 45 years    Types: Cigarettes    Quit date: 06/29/2012  . Smokeless tobacco: Never Used  . Alcohol Use: Yes     Comment: socially  . Drug Use: No  . Sexual Activity: Not on file   Other Topics Concern  . Not on file   Social History Narrative   Married husband relapsing and remitting  MS. 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   Review of Systems  Constitutional: Negative.   HENT: Negative.   Respiratory: Negative.   Cardiovascular: Negative.   Gastrointestinal: Positive for abdominal pain. Negative for nausea, vomiting,  diarrhea, constipation, blood in stool, abdominal distention and rectal pain.  Endocrine: Negative.   Genitourinary: Negative.   Musculoskeletal: Negative.   Skin: Negative.   Allergic/Immunologic: Negative.   Neurological: Negative.   Hematological: Negative.   Psychiatric/Behavioral: Negative.   All other systems reviewed and are negative.      Objective:    BP 136/72 mmHg  Pulse 79  Temp(Src) 98.8 F (37.1 C) (Oral)  Ht 5\' 2"  (1.575 m)  Wt 217 lb 8 oz (98.657 kg)  BMI 39.77 kg/m2  SpO2 96%   Physical Exam  Constitutional: She is oriented to person, place, and time. She appears well-developed and well-nourished. No distress.  HENT:  Head: Normocephalic and atraumatic.  Eyes: Conjunctivae are normal.  Neck: Normal range of motion.  Cardiovascular: Normal rate, regular rhythm and normal heart sounds.   Pulmonary/Chest: Effort normal and breath sounds normal. No respiratory distress. She has no wheezes. She has no rales.  Abdominal: Soft. Bowel sounds are normal.  Musculoskeletal: Normal range of motion. She exhibits no edema.  Neurological: She is alert and oriented to person, place, and time. No cranial nerve deficit.  Skin: Skin is warm and dry.  Psychiatric: She has a normal mood and affect. Her behavior is normal. Thought content normal.  Nursing note and vitals reviewed.         Assessment & Plan:   Welcome to Medicare preventive visit - Plan: EKG 12-Lead, CBC with Differential/Platelet, Comprehensive metabolic panel, Lipid  panel, HIV antibody (with reflex), Hepatitis C Antibody  Postoperative hypothyroidism - Plan: TSH, T4, Free  Adjustment disorder with depressed mood  Allergic rhinitis, unspecified allergic rhinitis type  Bipolar depression  HLD (hyperlipidemia) No Follow-up on file.

## 2015-08-07 NOTE — Assessment & Plan Note (Signed)
Followed by urology. Currently asymptomatic. 

## 2015-08-08 ENCOUNTER — Encounter: Payer: Self-pay | Admitting: *Deleted

## 2015-08-08 LAB — HEPATITIS C ANTIBODY: HCV Ab: NEGATIVE

## 2015-08-08 LAB — HIV ANTIBODY (ROUTINE TESTING W REFLEX): HIV: NONREACTIVE

## 2015-08-13 ENCOUNTER — Other Ambulatory Visit: Payer: Self-pay | Admitting: Family Medicine

## 2015-08-15 ENCOUNTER — Other Ambulatory Visit (INDEPENDENT_AMBULATORY_CARE_PROVIDER_SITE_OTHER): Payer: PPO

## 2015-08-15 DIAGNOSIS — Z1211 Encounter for screening for malignant neoplasm of colon: Secondary | ICD-10-CM

## 2015-08-15 LAB — FECAL OCCULT BLOOD, IMMUNOCHEMICAL: Fecal Occult Bld: NEGATIVE

## 2015-08-16 ENCOUNTER — Encounter: Payer: Self-pay | Admitting: *Deleted

## 2015-08-24 ENCOUNTER — Encounter: Payer: Self-pay | Admitting: Family Medicine

## 2015-10-09 ENCOUNTER — Ambulatory Visit: Payer: PPO

## 2015-10-31 ENCOUNTER — Ambulatory Visit
Admission: RE | Admit: 2015-10-31 | Discharge: 2015-10-31 | Disposition: A | Discharge status: Home / Self Care | Payer: PPO | Source: Ambulatory Visit | Attending: Family Medicine | Admitting: Family Medicine

## 2015-10-31 DIAGNOSIS — Z1239 Encounter for other screening for malignant neoplasm of breast: Secondary | ICD-10-CM

## 2016-01-09 ENCOUNTER — Ambulatory Visit: Payer: PPO

## 2016-01-09 ENCOUNTER — Telehealth: Payer: Self-pay | Admitting: Family Medicine

## 2016-01-09 NOTE — Telephone Encounter (Signed)
Patient notified as instructed by telephone and verbalized understanding. Patient stated that she will call back and reschedule when she is feeling better. Appointment scheduled for today.

## 2016-01-09 NOTE — Telephone Encounter (Signed)
No she should reschedule if she has a fever

## 2016-01-09 NOTE — Telephone Encounter (Signed)
[id number] Pt called because she has an appt today for flu vaccine, but states that she has come down with the flu, light fever, body aches.  Wants to know if she should still come in for vaccines.  Pt request cb

## 2016-02-14 ENCOUNTER — Other Ambulatory Visit: Payer: Self-pay | Admitting: Family Medicine

## 2016-02-17 ENCOUNTER — Other Ambulatory Visit: Payer: Self-pay | Admitting: Family Medicine

## 2016-05-18 ENCOUNTER — Encounter: Payer: Self-pay | Admitting: Family Medicine

## 2016-05-20 MED ORDER — LEVOTHYROXINE SODIUM 112 MCG PO TABS: 112.0000 ug | ORAL_TABLET | Freq: Every day | ORAL | Status: DC

## 2016-08-07 ENCOUNTER — Ambulatory Visit (INDEPENDENT_AMBULATORY_CARE_PROVIDER_SITE_OTHER): Payer: PPO

## 2016-08-07 ENCOUNTER — Other Ambulatory Visit: Payer: Self-pay

## 2016-08-07 VITALS — BP 134/82 | HR 69 | Temp 98.8°F | Ht 62.0 in | Wt 227.5 lb

## 2016-08-07 DIAGNOSIS — Z23 Encounter for immunization: Secondary | ICD-10-CM

## 2016-08-07 DIAGNOSIS — Z0000 Encounter for general adult medical examination without abnormal findings: Secondary | ICD-10-CM | POA: Diagnosis not present

## 2016-08-07 MED ORDER — LEVOTHYROXINE SODIUM 112 MCG PO TABS
112.0000 ug | ORAL_TABLET | Freq: Every day | ORAL | 0 refills | Status: DC
Start: 2016-08-07 — End: 2016-09-12

## 2016-08-07 NOTE — Patient Instructions (Signed)
Ms. Koep , Thank you for taking time to come for your Medicare Wellness Visit. I appreciate your ongoing commitment to your health goals. Please review the following plan we discussed and let me know if I can assist you in the future.   These are the goals we discussed: Goals    . Increase physical activity          Starting 08/07/16, I will continue to exercise for at least 30 min 3-4 days per week.        This is a list of the screening recommended for you and due dates:  Health Maintenance  Topic Date Due  . Shingles Vaccine  09/12/2016*  . Flu Shot  08/07/2029*  . Mammogram  10/30/2017  . Colon Cancer Screening  06/29/2020  . Tetanus Vaccine  08/06/2025  . DEXA scan (bone density measurement)  Addressed  .  Hepatitis C: One time screening is recommended by Center for Disease Control  (CDC) for  adults born from [birth year] through 1966.   Completed  . Pneumonia vaccines  Completed  *Topic was postponed. The date shown is not the original due date.   Preventive Care for Adults  A healthy lifestyle and preventive care can promote health and wellness. Preventive health guidelines for adults include the following key practices.  . A routine yearly physical is a good way to check with your health care provider about your health and preventive screening. It is a chance to share any concerns and updates on your health and to receive a thorough exam.  . Visit your dentist for a routine exam and preventive care every 6 months. Brush your teeth twice a day and floss once a day. Good oral hygiene prevents tooth decay and gum disease.  . The frequency of eye exams is based on your age, health, family medical history, use  of contact lenses, and other factors. Follow your health care provider's ecommendations for frequency of eye exams.  . Eat a healthy diet. Foods like vegetables, fruits, whole grains, low-fat dairy products, and lean protein foods contain the nutrients you need without too many  calories. Decrease your intake of foods high in solid fats, added sugars, and salt. Eat the right amount of calories for you. Get information about a proper diet from your health care provider, if necessary.  . Regular physical exercise is one of the most important things you can do for your health. Most adults should get at least 150 minutes of moderate-intensity exercise (any activity that increases your heart rate and causes you to sweat) each week. In addition, most adults need muscle-strengthening exercises on 2 or more days a week.  Silver Sneakers may be a benefit available to you. To determine eligibility, you may visit the website: www.silversneakers.com or contact program at [phone]) [phone] Mon-Fri between 8AM-8PM.   . Maintain a healthy weight. The body mass index (BMI) is a screening tool to identify possible weight problems. It provides an estimate of body fat based on height and weight. Your health care provider can find your BMI and can help you achieve or maintain a healthy weight.   For adults 20 years and older: ? A BMI below 18.5 is considered underweight. ? A BMI of 18.5 to 24.9 is normal. ? A BMI of 25 to 29.9 is considered overweight. ? A BMI of 30 and above is considered obese.   . Maintain normal blood lipids and cholesterol levels by exercising and minimizing your intake of saturated fat. Eat  a balanced diet with plenty of fruit and vegetables. Blood tests for lipids and cholesterol should begin at age 53 and be repeated every 5 years. If your lipid or cholesterol levels are high, you are over 50, or you are at high risk for heart disease, you may need your cholesterol levels checked more frequently. Ongoing high lipid and cholesterol levels should be treated with medicines if diet and exercise are not working.  . If you smoke, find out from your health care provider how to quit. If you do not use tobacco, please do not start.  . If you choose to drink alcohol, please do  not consume more than 2 drinks per day. One drink is considered to be 12 ounces (355 mL) of beer, 5 ounces (148 mL) of wine, or 1.5 ounces (44 mL) of liquor.  . If you are 60-58 years old, ask your health care provider if you should take aspirin to prevent strokes.  . Use sunscreen. Apply sunscreen liberally and repeatedly throughout the day. You should seek shade when your shadow is shorter than you. Protect yourself by wearing long sleeves, pants, a wide-brimmed hat, and sunglasses year round, whenever you are outdoors.  . Once a month, do a whole body skin exam, using a mirror to look at the skin on your back. Tell your health care provider of new moles, moles that have irregular borders, moles that are larger than a pencil eraser, or moles that have changed in shape or color.

## 2016-08-07 NOTE — Telephone Encounter (Signed)
Pt in today for AWV. Pt needs refill of Levothyroxine 112 mcg until CPE on 09/12/2016.

## 2016-08-07 NOTE — Progress Notes (Signed)
Pre visit review using our clinic review tool, if applicable. No additional management support is needed unless otherwise documented below in the visit note. 

## 2016-08-07 NOTE — Progress Notes (Signed)
I reviewed health advisor's note, was available for consultation, and agree with documentation and plan.  

## 2016-08-07 NOTE — Progress Notes (Signed)
PCP notes:   Health maintenance:  Flu vaccine - addressed/pt declined PPSV23 - administered Shingles - pt plans to get vaccine at CPE  Abnormal screenings:   Fall risk - hx of fall without injury  Patient concerns:   Pt needs refill for Synthroid. Sent telephone note to PCP  Nurse concerns:  None  Next PCP appt:   09/12/16 @ 0915

## 2016-08-07 NOTE — Progress Notes (Signed)
Subjective:   Amber Gutierrez is a 65 y.o. female who presents for Medicare Annual (Subsequent) preventive examination.  Review of Systems:  N/A Cardiac Risk Factors include: advanced age (>50men, >53 women);obesity (BMI >30kg/m2);dyslipidemia     Objective:     Vitals: BP 134/82 (BP Location: Left Arm, Patient Position: Sitting, Cuff Size: Normal)   Pulse 69   Temp 98.8 F (37.1 C) (Oral)   Ht 5\' 2"  (1.575 m)   Wt 227 lb 8 oz (103.2 kg)   SpO2 95%   BMI 41.61 kg/m   Body mass index is 41.61 kg/m.   Tobacco History  Smoking Status  . Former Smoker  . Packs/day: 0.50  . Years: 45.00  . Types: Cigarettes  . Quit date: 06/29/2012  Smokeless Tobacco  . Never Used     Counseling given: No   Past Medical History:  Diagnosis Date  . Allergic rhinitis   . Bipolar 2 disorder ([place])   . Bladder neoplasm   . Chronic cystitis   . DJD (degenerative joint disease) of lumbar spine    has seen Dr. Joya Salm  . History of basal cell carcinoma excision    2014  --forehead  . History of nephrolithiasis    lithotrypsy calcium stone  . History of radiation therapy    thyroid polyps  1992  . History of recurrent UTIs   . Other postablative hypothyroidism    HX  RAI X2   Past Surgical History:  Procedure Laterality Date  . [place]  . CHOLECYSTECTOMY  08/20/2012   Procedure: LAPAROSCOPIC CHOLECYSTECTOMY WITH INTRAOPERATIVE CHOLANGIOGRAM;  Surgeon: Gayland Curry, MD,FACS;  Location: [facility];  Service: General;  Laterality: N/A;  . CYSTOSCOPY WITH BIOPSY N/A 08/27/2013   Procedure: CYSTOSCOPY WITH BLADDER BIOPSY/FULGRATION;  Surgeon: Fredricka Bonine, MD;  Location: [facility];  Service: Urology;  Laterality: N/A;  . EXTRACORPOREAL SHOCK WAVE LITHOTRIPSY  2010  . MASS EXCISION  08/20/2012   Procedure: EXCISION MASS;  Surgeon: Gayland Curry, MD,FACS;  [facility];  Service: General;  Laterality: N/A;  . TUBAL LIGATION     Family  History  Problem Relation Age of Onset  . Heart attack Father     34  deceased  . Alcohol abuse Father   . Breast cancer Sister   . Gallbladder disease Sister   . Bipolar disorder Mother   . Cancer Maternal Grandmother     colon   History  Sexual Activity  . Sexual activity: No    Outpatient Encounter Prescriptions as of 08/07/2016  Medication Sig  . beta carotene w/minerals (OCUVITE) tablet Take 1 tablet by mouth daily.  Marland Kitchen buPROPion (WELLBUTRIN XL) 300 MG 24 hr tablet Take 300 mg by mouth every morning.   . LamoTRIgine (LAMICTAL XR) 300 MG TB24 Take 1 tablet by mouth every morning.  Marland Kitchen levothyroxine (SYNTHROID, LEVOTHROID) 112 MCG tablet Take 1 tablet (112 mcg total) by mouth daily with breakfast.  . Multiple Vitamin (MULTIVITAMIN WITH MINERALS) TABS Take 2 tablets by mouth daily.  . naproxen sodium (ANAPROX) 220 MG tablet Take 220 mg by mouth daily as needed. For pain  . OVER THE COUNTER MEDICATION Take 1 tablet by mouth daily. Ultra Energy Plus  . Probiotic Product (PROBIOTIC PO) Take 1 tablet by mouth at bedtime.   . [DISCONTINUED] ciprofloxacin (CIPRO) 500 MG tablet Take 1 tablet (500 mg total) by mouth 2 (two) times daily.  . [DISCONTINUED] Meth-Hyo-M  Bl-Na Phos-Ph Sal (URIBEL) 118 MG CAPS Take 1 capsule (118 mg total) by mouth every 6 (six) hours as needed.   No facility-administered encounter medications on file as of 08/07/2016.     Activities of Daily Living In your present state of health, do you have any difficulty performing the following activities: 08/07/2016  Hearing? Y  Vision? N  Difficulty concentrating or making decisions? N  Walking or climbing stairs? N  Dressing or bathing? N  Doing errands, shopping? N  Preparing Food and eating ? N  Using the Toilet? N  In the past six months, have you accidently leaked urine? Y  Do you have problems with loss of bowel control? N  Managing your Medications? N  Managing your Finances? N  Housekeeping or managing your  Housekeeping? N  Some recent data might be hidden    Patient Care Team: Lucille Passy, MD as PCP - General (Family Medicine) Orion Crook, MD (Inactive) (Urology) Juanita Craver, MD (Gastroenterology) Megan Salon, MD (Gynecology) Druscilla Brownie, MD (Dermatology) Festus Aloe, MD as Consulting Physician (Urology) Chucky May, MD as Consulting Physician (Psychiatry)    Assessment:    Hearing Screening Comments: Wears bilateral hearing aids Vision Screening Comments: Last vision exam in July 2016. Pt plans to have appt after vacation.   Exercise Activities and Dietary recommendations Current Exercise Habits: Home exercise routine, Type of exercise: walking;Other - see comments (swimming), Time (Minutes): 30, Frequency (Times/Week): 4, Weekly Exercise (Minutes/Week): 120, Intensity: Moderate, Exercise limited by: None identified  Goals    . Increase physical activity          Starting 08/07/16, I will continue to exercise for at least 30 min 3-4 days per week.       Fall Risk Fall Risk  08/07/2016  Falls in the past year? Yes  Number falls in past yr: 1  Injury with Fall? No  Follow up Falls evaluation completed;Falls prevention discussed   Depression Screen PHQ 2/9 Scores 08/07/2016  PHQ - 2 Score 0     Cognitive Testing MMSE - Mini Mental State Exam 08/07/2016  Orientation to time 5  Orientation to Place 5  Registration 3  Attention/ Calculation 0  Recall 3  Language- name 2 objects 0  Language- repeat 1  Language- follow 3 step command 3  Language- read & follow direction 0  Write a sentence 0  Copy design 0  Total score 20   PLEASE NOTE: A Mini-Cog screen was completed. Maximum score is 20. A value of 0 denotes this part of [place] MMSE was not completed or the patient failed this part of the Mini-Cog screening.   Mini-Cog Screening Orientation to Time - Max 5 pts Orientation to Place - Max 5 pts Registration - Max 3 pts Recall - Max 3 pts Language  Repeat - Max 1 pts Language Follow 3 Step Command - Max 3 pts   Immunization History  Administered Date(s) Administered  . Influenza Whole 11/13/2010  . Pneumococcal Conjugate-13 08/07/2015  . Td 12/30/2004, 08/07/2015   Screening Tests Health Maintenance  Topic Date Due  . ZOSTAVAX  09/12/2016 (Originally 03/11/2010)  . INFLUENZA VACCINE  08/07/2029 (Originally 07/30/2016)  . MAMMOGRAM  10/30/2017  . COLONOSCOPY  06/29/2020  . TETANUS/TDAP  08/06/2025  . DEXA SCAN  Addressed  . Hepatitis C Screening  Completed  . PNA vac Low Risk Adult  Completed      Plan:     I have personally reviewed and addressed  the Medicare Annual Wellness questionnaire and have noted the following in the patient's chart:  A. Medical and social history B. Use of alcohol, tobacco or illicit drugs  C. Current medications and supplements D. Functional ability and status E.  Nutritional status F.  Physical activity G. Advance directives H. List of other physicians I.  Hospitalizations, surgeries, and ER visits in previous 12 months J.  [place] to include hearing, vision, cognitive, depression L. Referrals and appointments - none  In addition, I have reviewed and discussed with patient certain preventive protocols, quality metrics, and best practice recommendations. A written personalized care plan for preventive services as well as general preventive health recommendations were provided to patient.  See attached scanned questionnaire for additional information.   Signed,   Lindell Noe, MHA, BS, LPN Health Advisor

## 2016-08-08 ENCOUNTER — Encounter: Payer: PPO | Admitting: Family Medicine

## 2016-09-12 ENCOUNTER — Encounter: Payer: Self-pay | Admitting: Family Medicine

## 2016-09-12 ENCOUNTER — Ambulatory Visit (INDEPENDENT_AMBULATORY_CARE_PROVIDER_SITE_OTHER): Payer: PPO | Admitting: Family Medicine

## 2016-09-12 VITALS — BP 116/78 | HR 64 | Temp 98.4°F | Ht 62.0 in | Wt 230.5 lb

## 2016-09-12 DIAGNOSIS — E890 Postprocedural hypothyroidism: Secondary | ICD-10-CM

## 2016-09-12 DIAGNOSIS — F319 Bipolar disorder, unspecified: Secondary | ICD-10-CM

## 2016-09-12 DIAGNOSIS — Z23 Encounter for immunization: Secondary | ICD-10-CM

## 2016-09-12 DIAGNOSIS — F3130 Bipolar disorder, current episode depressed, mild or moderate severity, unspecified: Secondary | ICD-10-CM

## 2016-09-12 DIAGNOSIS — Z01419 Encounter for gynecological examination (general) (routine) without abnormal findings: Secondary | ICD-10-CM

## 2016-09-12 DIAGNOSIS — Z0000 Encounter for general adult medical examination without abnormal findings: Secondary | ICD-10-CM | POA: Diagnosis not present

## 2016-09-12 DIAGNOSIS — E785 Hyperlipidemia, unspecified: Secondary | ICD-10-CM | POA: Diagnosis not present

## 2016-09-12 LAB — TSH: TSH: 3.3 u[IU]/mL (ref 0.35–4.50)

## 2016-09-12 LAB — COMPREHENSIVE METABOLIC PANEL
ALT: 15 U/L (ref 0–35)
AST: 16 U/L (ref 0–37)
Albumin: 4 g/dL (ref 3.5–5.2)
Alkaline Phosphatase: 105 U/L (ref 39–117)
BUN: 27 mg/dL — ABNORMAL HIGH (ref 6–23)
CO2: 31 meq/L (ref 19–32)
Calcium: 9.2 mg/dL (ref 8.4–10.5)
Chloride: 106 mEq/L (ref 96–112)
Creatinine, Ser: 0.84 mg/dL (ref 0.40–1.20)
GFR: 71.99 mL/min (ref >60.00)
GLUCOSE: 107 mg/dL — AB (ref 70–99)
POTASSIUM: 4.6 meq/L (ref 3.5–5.1)
SODIUM: 140 meq/L (ref 135–145)
TOTAL PROTEIN: 6.8 g/dL (ref 6.0–8.3)
Total Bilirubin: 0.4 mg/dL (ref 0.2–1.2)

## 2016-09-12 LAB — CBC WITH DIFFERENTIAL/PLATELET
BASOS ABS: 0.1 10*3/uL (ref 0.0–0.1)
Basophils Relative: 0.8 % (ref 0.0–3.0)
Eosinophils Absolute: 0.2 10*3/uL (ref 0.0–0.7)
Eosinophils Relative: 3.8 % (ref 0.0–5.0)
HCT: 44.7 % (ref 36.0–46.0)
Hemoglobin: 15.2 g/dL — ABNORMAL HIGH (ref 12.0–15.0)
LYMPHS ABS: 1.4 10*3/uL (ref 0.7–4.0)
Lymphocytes Relative: 22.3 % (ref 12.0–46.0)
MCHC: 34.1 g/dL (ref 30.0–36.0)
MCV: 88 fl (ref 78.0–100.0)
MONO ABS: 0.5 10*3/uL (ref 0.1–1.0)
Monocytes Relative: 7.7 % (ref 3.0–12.0)
NEUTROS PCT: 65.4 % (ref 43.0–77.0)
Neutro Abs: 4.2 10*3/uL (ref 1.4–7.7)
Platelets: 216 10*3/uL (ref 150.0–400.0)
RBC: 5.08 Mil/uL (ref 3.87–5.11)
RDW: 13.2 % (ref 11.5–15.5)
WBC: 6.5 10*3/uL (ref 4.0–10.5)

## 2016-09-12 LAB — LIPID PANEL
CHOLESTEROL: 174 mg/dL (ref 0–200)
HDL: 50.3 mg/dL (ref >39.00)
LDL Cholesterol: 106 mg/dL — ABNORMAL HIGH (ref 0–99)
NonHDL: 123.45
TRIGLYCERIDES: 89 mg/dL (ref 0.0–149.0)
Total CHOL/HDL Ratio: 3
VLDL: 17.8 mg/dL (ref 0.0–40.0)

## 2016-09-12 LAB — T4, FREE: FREE T4: 0.81 ng/dL (ref 0.60–1.60)

## 2016-09-12 MED ORDER — LEVOTHYROXINE SODIUM 112 MCG PO TABS: 112.0000 ug | ORAL_TABLET | Freq: Every day | ORAL | 2 refills | Status: DC

## 2016-09-12 MED ORDER — LEVOTHYROXINE SODIUM 112 MCG PO TABS: 112.0000 ug | ORAL_TABLET | Freq: Every day | ORAL | 3 refills | Status: DC

## 2016-09-12 NOTE — Progress Notes (Signed)
Pre visit review using our clinic review tool, if applicable. No additional management support is needed unless otherwise documented below in the visit note. 

## 2016-09-12 NOTE — Assessment & Plan Note (Signed)
Symptoms well controlled. Followed by psych.

## 2016-09-12 NOTE — Assessment & Plan Note (Addendum)
Reviewed preventive care protocols, scheduled due services, and updated immunizations Discussed nutrition, exercise, diet, and healthy lifestyle.  zostavax today.

## 2016-09-12 NOTE — Assessment & Plan Note (Signed)
Continue current rxs.  Check labs today. 

## 2016-09-12 NOTE — Assessment & Plan Note (Signed)
Synthroid refilled. Due for labs today.

## 2016-09-12 NOTE — Progress Notes (Signed)
65 yo pleasant female here for CPX.  Annual wellness visit with Candis Musa, RN, on 08/07/16.  Notes reviewed.  Loves to travel- she is going to [place] next month!  Bipolar disorder- followed by psych every 3 months. Has been on Lamictal for years (and Wellbutrin for years before that). Feels like Lamictal has been very effective. No recent episodes of depression or mania.  Hypothyroidism- has been on same dose of synthroid for years- synthroid 112 mcg daily.  Denies any symptoms of hypo or hyperthyroidism.  Lab Results  Component Value Date   TSH 1.28 08/07/2015   Lab Results  Component Value Date   WBC 6.4 08/07/2015   HGB 16.2 (H) 08/07/2015   HCT 47.4 (H) 08/07/2015   MCV 87.4 08/07/2015   PLT 249.0 08/07/2015   Lab Results  Component Value Date   CHOL 165 08/07/2015   HDL 47.70 08/07/2015   LDLCALC 101 (H) 08/07/2015   TRIG 81.0 08/07/2015   CHOLHDL 3 08/07/2015   Lab Results  Component Value Date   ALT 17 08/07/2015   AST 18 08/07/2015   ALKPHOS 119 (H) 08/07/2015   BILITOT 0.6 08/07/2015   Lab Results  Component Value Date   CREATININE 0.82 08/07/2015   Current Outpatient Prescriptions on File Prior to Visit  Medication Sig Dispense Refill  . beta carotene w/minerals (OCUVITE) tablet Take 1 tablet by mouth daily.    Marland Kitchen buPROPion (WELLBUTRIN XL) 300 MG 24 hr tablet Take 300 mg by mouth every morning.     . LamoTRIgine (LAMICTAL XR) 300 MG TB24 Take 1 tablet by mouth every morning.    . Multiple Vitamin (MULTIVITAMIN WITH MINERALS) TABS Take 2 tablets by mouth daily.    . naproxen sodium (ANAPROX) 220 MG tablet Take 220 mg by mouth daily as needed. For pain    . OVER THE COUNTER MEDICATION Take 1 tablet by mouth daily. Ultra Energy Plus    . Probiotic Product (PROBIOTIC PO) Take 1 tablet by mouth at bedtime.      No current facility-administered medications on file prior to visit.     Allergies  Allergen Reactions  . Pravastatin Sodium Other (See  Comments)     Muscle pain and weakness  . Sulfa Antibiotics Hives    Past Medical History:  Diagnosis Date  . Allergic rhinitis   . Bipolar 2 disorder ([place])   . Bladder neoplasm   . Chronic cystitis   . DJD (degenerative joint disease) of lumbar spine    has seen Dr. Joya Salm  . History of basal cell carcinoma excision    2014  --forehead  . History of nephrolithiasis    lithotrypsy calcium stone  . History of radiation therapy    thyroid polyps  1992  . History of recurrent UTIs   . Other postablative hypothyroidism    HX  RAI X2    Past Surgical History:  Procedure Laterality Date  . [place]  . CHOLECYSTECTOMY  08/20/2012   Procedure: LAPAROSCOPIC CHOLECYSTECTOMY WITH INTRAOPERATIVE CHOLANGIOGRAM;  Surgeon: Gayland Curry, MD,FACS;  Location: [facility];  Service: General;  Laterality: N/A;  . CYSTOSCOPY WITH BIOPSY N/A 08/27/2013   Procedure: CYSTOSCOPY WITH BLADDER BIOPSY/FULGRATION;  Surgeon: Fredricka Bonine, MD;  Location: [facility];  Service: Urology;  Laterality: N/A;  . EXTRACORPOREAL SHOCK WAVE LITHOTRIPSY  2010  . MASS EXCISION  08/20/2012   Procedure: EXCISION MASS;  Surgeon: Gayland Curry, MD,FACS;  [facility];  Service: General;  Laterality: N/A;  . TUBAL LIGATION      Family History  Problem Relation Age of Onset  . Heart attack Father     60  deceased  . Alcohol abuse Father   . Breast cancer Sister   . Gallbladder disease Sister   . Bipolar disorder Mother   . Cancer Maternal Grandmother     colon    Social History   Social History  . Marital status: Married    Spouse name: N/A  . Number of children: N/A  . Years of education: N/A   Occupational History  . Not on file.   Social History Main Topics  . Smoking status: Former Smoker    Packs/day: 0.50    Years: 45.00    Types: Cigarettes    Quit date: 06/29/2012  . Smokeless tobacco: Never Used  . Alcohol use Yes     Comment: socially  . Drug  use: No  . Sexual activity: No   Other Topics Concern  . Not on file   Social History Narrative   Married husband relapsing and remitting  MS. 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   Review of Systems  Constitutional: Negative.   HENT: Negative.   Respiratory: Negative.   Cardiovascular: Negative.   Gastrointestinal: Negative for abdominal distention, abdominal pain, blood in stool, constipation, diarrhea, nausea, rectal pain and vomiting.  Endocrine: Negative.   Genitourinary: Negative.   Musculoskeletal: Negative.   Skin: Negative.   Allergic/Immunologic: Negative.   Neurological: Negative.   Hematological: Negative.   Psychiatric/Behavioral: Negative.   All other systems reviewed and are negative.      Objective:    BP 116/78   Pulse 64   Temp 98.4 F (36.9 C) (Oral)   Ht 5\' 2"  (1.575 m)   Wt 230 lb 8 oz (104.6 kg)   SpO2 96%   BMI 42.16 kg/m    Physical Exam  Constitutional: She is oriented to person, place, and time. She appears well-developed and well-nourished. No distress.  HENT:  Head: Normocephalic and atraumatic.  Eyes: Conjunctivae are normal.  Neck: Normal range of motion.  Cardiovascular: Normal rate, regular rhythm and normal heart sounds.   Pulmonary/Chest: Effort normal and breath sounds normal. No respiratory distress. She has no wheezes. She has no rales.  Abdominal: Soft. Bowel sounds are normal.  Musculoskeletal: Normal range of motion. She exhibits no edema.  Neurological: She is alert and oriented to person, place, and time. No cranial nerve deficit.  Skin: Skin is warm and dry.  Psychiatric: She has a normal mood and affect. Her behavior is normal. Thought content normal.  Nursing  note and vitals reviewed.         Assessment & Plan:   Postoperative hypothyroidism - Plan: Comprehensive metabolic panel, TSH, T4, Free  Well woman exam - Plan: CBC with Differential/Platelet  HLD (hyperlipidemia) - Plan: Lipid panel  Bipolar depression ([place]) No Follow-up on file.

## 2016-09-12 NOTE — Patient Instructions (Signed)
Great to see you. Have a great time!

## 2016-09-13 ENCOUNTER — Encounter: Payer: Self-pay | Admitting: Family Medicine

## 2016-11-06 DIAGNOSIS — R32 Unspecified urinary incontinence: Secondary | ICD-10-CM | POA: Diagnosis not present

## 2016-11-06 DIAGNOSIS — N3020 Other chronic cystitis without hematuria: Secondary | ICD-10-CM | POA: Diagnosis not present

## 2016-11-07 ENCOUNTER — Other Ambulatory Visit: Payer: Self-pay | Admitting: Family Medicine

## 2016-11-07 ENCOUNTER — Encounter: Payer: Self-pay | Admitting: Family Medicine

## 2016-11-07 DIAGNOSIS — L723 Sebaceous cyst: Secondary | ICD-10-CM

## 2016-11-12 ENCOUNTER — Encounter: Payer: Self-pay | Admitting: *Deleted

## 2016-11-20 ENCOUNTER — Ambulatory Visit: Payer: Self-pay | Admitting: General Surgery

## 2017-01-01 ENCOUNTER — Encounter: Payer: Self-pay | Admitting: General Surgery

## 2017-01-01 ENCOUNTER — Ambulatory Visit (INDEPENDENT_AMBULATORY_CARE_PROVIDER_SITE_OTHER): Payer: PPO | Admitting: General Surgery

## 2017-01-01 VITALS — BP 132/74 | HR 72 | Resp 12 | Ht 62.0 in | Wt 230.0 lb

## 2017-01-01 DIAGNOSIS — R222 Localized swelling, mass and lump, trunk: Secondary | ICD-10-CM

## 2017-01-01 DIAGNOSIS — L723 Sebaceous cyst: Secondary | ICD-10-CM | POA: Diagnosis not present

## 2017-01-01 MED ORDER — DOXYCYCLINE HYCLATE 100 MG PO TABS: 100.0000 mg | ORAL_TABLET | Freq: Two times a day (BID) | ORAL | 0 refills | Status: DC

## 2017-01-01 NOTE — Patient Instructions (Signed)
Return on two weeks

## 2017-01-01 NOTE — Progress Notes (Signed)
Patient ID: Amber Gutierrez, female   DOB: 07/15/1951, 66 y.o.   MRN: [id number]  Chief Complaint  Patient presents with  . Other    HPI Amber Gutierrez is a 66 y.o. female here today for a evaluation of a sebaceous cyst on lower back. Patient noticed this about 20 years ago. The area caused any symptoms until six weeks ago. The area started draining about a month ago and still is draining.  She has had sebaceous cysts in the past removed. I have reviewed the history of present illness with the patient.  HPI  Past Medical History:  Diagnosis Date  . Allergic rhinitis   . Bipolar 2 disorder ([place])   . Bladder neoplasm   . Chronic cystitis   . DJD (degenerative joint disease) of lumbar spine    has seen Dr. Joya Salm  . History of basal cell carcinoma excision    2014  --forehead  . History of nephrolithiasis    lithotrypsy calcium stone  . History of radiation therapy    thyroid polyps  1992  . History of recurrent UTIs   . Other postablative hypothyroidism    HX  RAI X2    Past Surgical History:  Procedure Laterality Date  . [place]  . CHOLECYSTECTOMY  08/20/2012   Procedure: LAPAROSCOPIC CHOLECYSTECTOMY WITH INTRAOPERATIVE CHOLANGIOGRAM;  Surgeon: Gayland Curry, MD,FACS;  Location: [facility];  Service: General;  Laterality: N/A;  . CYSTOSCOPY WITH BIOPSY N/A 08/27/2013   Procedure: CYSTOSCOPY WITH BLADDER BIOPSY/FULGRATION;  Surgeon: Fredricka Bonine, MD;  [facility];  Service: Urology;  Laterality: N/A;  . EXTRACORPOREAL SHOCK WAVE LITHOTRIPSY  2010  . MASS EXCISION  08/20/2012   Procedure: EXCISION MASS;  Surgeon: Gayland Curry, MD,FACS;  [facility];  Service: General;  Laterality: N/A;  . TUBAL LIGATION      Family History  Problem Relation Age of Onset  . Heart attack Father     71  deceased  . Alcohol abuse Father   . Breast cancer Sister   . Gallbladder disease Sister   . Bipolar disorder Mother   . Cancer  Maternal Grandmother     colon    Social History Social History  Substance Use Topics  . Smoking status: Former Smoker    Packs/day: 0.50    Years: 45.00    Types: Cigarettes    Quit date: 06/29/2012  . Smokeless tobacco: Never Used  . Alcohol use Yes     Comment: socially    Allergies  Allergen Reactions  . Pravastatin Sodium Other (See Comments)     Muscle pain and weakness  . Sulfa Antibiotics Hives    Current Outpatient Prescriptions  Medication Sig Dispense Refill  . beta carotene w/minerals (OCUVITE) tablet Take 1 tablet by mouth daily.    Marland Kitchen buPROPion (WELLBUTRIN XL) 300 MG 24 hr tablet Take 300 mg by mouth every morning.     . LamoTRIgine (LAMICTAL XR) 300 MG TB24 Take 1 tablet by mouth every morning.    Marland Kitchen levothyroxine (SYNTHROID, LEVOTHROID) 112 MCG tablet Take 1 tablet (112 mcg total) by mouth daily with breakfast. 90 tablet 3  . Multiple Vitamin (MULTIVITAMIN WITH MINERALS) TABS Take 2 tablets by mouth daily.    . naproxen sodium (ANAPROX) 220 MG tablet Take 220 mg by mouth daily as needed. For pain    . OVER THE COUNTER MEDICATION Take 1 tablet by mouth daily. Ultra Energy  Plus    . Probiotic Product (PROBIOTIC PO) Take 1 tablet by mouth at bedtime.     Marland Kitchen doxycycline (VIBRA-TABS) 100 MG tablet Take 1 tablet (100 mg total) by mouth 2 (two) times daily. 20 tablet 0   No current facility-administered medications for this visit.     Review of Systems Review of Systems  Constitutional: Negative.   Respiratory: Negative.   Cardiovascular: Negative.     Blood pressure 132/74, pulse 72, resp. rate 12, height 5\' 2"  (1.575 m), weight 230 lb (104.3 kg).  Physical Exam Physical Exam  Constitutional: She is oriented to person, place, and time. She appears well-developed and well-nourished.  Eyes: Conjunctivae are normal. No scleral icterus.  Cardiovascular: Normal rate and regular rhythm.   Neurological: She is alert and oriented to person, place, and time.  Skin:  Skin is warm and dry.       Data Reviewed Notes reviewed  Assessment    Infamed sebaceous cyst, low back area. Recommended excision after a course of Doxycycline  Plan    Rx- Doxycycline 100mg  BID for 10 days   Patient to return in 7-10 days for excision of the cyst This information has been scribed by Gaspar Cola CMA.   SANKAR,SEEPLAPUTHUR G 01/01/2017, 10:31 AM

## 2017-01-09 ENCOUNTER — Ambulatory Visit (INDEPENDENT_AMBULATORY_CARE_PROVIDER_SITE_OTHER): Payer: PPO | Admitting: General Surgery

## 2017-01-09 ENCOUNTER — Encounter: Payer: Self-pay | Admitting: General Surgery

## 2017-01-09 VITALS — BP 132/72 | HR 72 | Resp 12 | Ht 62.0 in | Wt 229.0 lb

## 2017-01-09 DIAGNOSIS — D213 Benign neoplasm of connective and other soft tissue of thorax: Secondary | ICD-10-CM | POA: Diagnosis not present

## 2017-01-09 DIAGNOSIS — L720 Epidermal cyst: Secondary | ICD-10-CM | POA: Diagnosis not present

## 2017-01-09 DIAGNOSIS — R222 Localized swelling, mass and lump, trunk: Secondary | ICD-10-CM

## 2017-01-09 NOTE — Progress Notes (Signed)
Patient ID: Amber Gutierrez, female   DOB: Dec 07, 1951, 66 y.o.   MRN: [id number]  Chief Complaint  Patient presents with  . Procedure    HPI Amber Gutierrez is a 66 y.o. female.  Here today for excision of a back cyst. No new complaints. I have reviewed the history of present illness with the patient.   HPI  Past Medical History:  Diagnosis Date  . Allergic rhinitis   . Bipolar 2 disorder ([place])   . Bladder neoplasm   . Chronic cystitis   . DJD (degenerative joint disease) of lumbar spine    has seen Dr. Joya Salm  . History of basal cell carcinoma excision    2014  --forehead  . History of nephrolithiasis    lithotrypsy calcium stone  . History of radiation therapy    thyroid polyps  1992  . History of recurrent UTIs   . Other postablative hypothyroidism    HX  RAI X2    Past Surgical History:  Procedure Laterality Date  . Ramos  . CHOLECYSTECTOMY  08/20/2012   Procedure: LAPAROSCOPIC CHOLECYSTECTOMY WITH INTRAOPERATIVE CHOLANGIOGRAM;  Surgeon: Gayland Curry, MD,FACS;  Location: [facility];  Service: General;  Laterality: N/A;  . CYSTOSCOPY WITH BIOPSY N/A 08/27/2013   Procedure: CYSTOSCOPY WITH BLADDER BIOPSY/FULGRATION;  Surgeon: Fredricka Bonine, MD;  Location: [facility];  Service: Urology;  Laterality: N/A;  . EXTRACORPOREAL SHOCK WAVE LITHOTRIPSY  2010  . MASS EXCISION  08/20/2012   Procedure: EXCISION MASS;  Surgeon: Gayland Curry, MD,FACS;  Location: [facility];  Service: General;  Laterality: N/A;  . TUBAL LIGATION      Family History  Problem Relation Age of Onset  . Heart attack Father     53  deceased  . Alcohol abuse Father   . Breast cancer Sister   . Gallbladder disease Sister   . Bipolar disorder Mother   . Cancer Maternal Grandmother     colon    Social History Social History  Substance Use Topics  . Smoking status: Former Smoker    Packs/day: 0.50    Years: 45.00    Types: Cigarettes    Quit date:  06/29/2012  . Smokeless tobacco: Never Used  . Alcohol use Yes     Comment: socially    Allergies  Allergen Reactions  . Pravastatin Sodium Other (See Comments)     Muscle pain and weakness  . Sulfa Antibiotics Hives    Current Outpatient Prescriptions  Medication Sig Dispense Refill  . beta carotene w/minerals (OCUVITE) tablet Take 1 tablet by mouth daily.    Marland Kitchen buPROPion (WELLBUTRIN XL) 300 MG 24 hr tablet Take 300 mg by mouth every morning.     . LamoTRIgine (LAMICTAL XR) 300 MG TB24 Take 1 tablet by mouth every morning.    Marland Kitchen levothyroxine (SYNTHROID, LEVOTHROID) 112 MCG tablet Take 1 tablet (112 mcg total) by mouth daily with breakfast. 90 tablet 3  . Multiple Vitamin (MULTIVITAMIN WITH MINERALS) TABS Take 2 tablets by mouth daily.    . naproxen sodium (ANAPROX) 220 MG tablet Take 220 mg by mouth daily as needed. For pain    . OVER THE COUNTER MEDICATION Take 1 tablet by mouth daily. Ultra Energy Plus    . Probiotic Product (PROBIOTIC PO) Take 1 tablet by mouth at bedtime.      No current facility-administered medications for this visit.     Review of Systems Review of  Systems  Constitutional: Negative.   Respiratory: Negative.   Cardiovascular: Negative.     Blood pressure 132/72, pulse 72, resp. rate 12, height 5\' 2"  (1.575 m), weight 229 lb (103.9 kg).  Physical Exam Physical Exam  Constitutional: She is oriented to person, place, and time. She appears well-developed and well-nourished.  Neurological: She is alert and oriented to person, place, and time.  Skin: Skin is warm and dry.  Psychiatric: Her behavior is normal.    Data Reviewed Notes reviewed   Assessment    Sebaceous cyst    Plan   Procedure: excision of sebaceous cyst back.  Anesthetic: 1020 ml 0.5% marcaine mixed with 1 % xylocaine  Prep: chloro prep  Description: After local and pre[p area draped out with sterile towels. 3cm long transverse elliptical incision made. The skin with attached  cyst was removed from the underlying subcutaneous tissue. Bleeding controlled with cautery and suture ligatures of 3-0 vicryl. Intermediate repair performed. Deeper layers closed with 3-0 vicryl. Skin closed with interrupted 4-0 Prolene stitches. Area dressed with neosporin, telfa and tegaderm.  Rx given for Tramadol, 50mg  q6h prn # 20    Return for suture removal in 2 weeks . This information has been scribed by Karie Fetch RN, BSN,BC.   Hason Ofarrell G 01/13/2017, 9:18 AM

## 2017-01-09 NOTE — Patient Instructions (Addendum)
The patient is aware to call back for any questions or concerns. May shower May remove dressing in 2-3 days Return for suture removal May use an Ice pack as needed for comfort  

## 2017-01-14 ENCOUNTER — Telehealth: Payer: Self-pay | Admitting: *Deleted

## 2017-01-14 NOTE — Telephone Encounter (Signed)
Patient notified, verbalized understanding

## 2017-01-14 NOTE — Telephone Encounter (Signed)
-----   Message from Christene Lye, MD sent at 01/13/2017  4:01 PM EST ----- Please let pt know path was a benign cyst

## 2017-01-23 ENCOUNTER — Ambulatory Visit (INDEPENDENT_AMBULATORY_CARE_PROVIDER_SITE_OTHER): Payer: PPO | Admitting: *Deleted

## 2017-01-23 DIAGNOSIS — R222 Localized swelling, mass and lump, trunk: Secondary | ICD-10-CM

## 2017-01-23 NOTE — Patient Instructions (Signed)
Return as needed

## 2017-01-23 NOTE — Progress Notes (Signed)
Patient came in today for a wound check.  The wound is clean, with no signs of infection noted.The sutures were removed.  

## 2017-02-18 DIAGNOSIS — R300 Dysuria: Secondary | ICD-10-CM | POA: Diagnosis not present

## 2017-02-18 DIAGNOSIS — R3915 Urgency of urination: Secondary | ICD-10-CM | POA: Diagnosis not present

## 2017-02-18 DIAGNOSIS — R8271 Bacteriuria: Secondary | ICD-10-CM | POA: Diagnosis not present

## 2017-03-12 DIAGNOSIS — N390 Urinary tract infection, site not specified: Secondary | ICD-10-CM | POA: Diagnosis not present

## 2017-03-12 DIAGNOSIS — N3020 Other chronic cystitis without hematuria: Secondary | ICD-10-CM | POA: Diagnosis not present

## 2017-03-12 DIAGNOSIS — R300 Dysuria: Secondary | ICD-10-CM | POA: Diagnosis not present

## 2017-04-24 DIAGNOSIS — R8271 Bacteriuria: Secondary | ICD-10-CM | POA: Diagnosis not present

## 2017-04-24 DIAGNOSIS — R300 Dysuria: Secondary | ICD-10-CM | POA: Diagnosis not present

## 2017-04-24 DIAGNOSIS — N3020 Other chronic cystitis without hematuria: Secondary | ICD-10-CM | POA: Diagnosis not present

## 2017-05-12 DIAGNOSIS — M6281 Muscle weakness (generalized): Secondary | ICD-10-CM | POA: Diagnosis not present

## 2017-05-12 DIAGNOSIS — R278 Other lack of coordination: Secondary | ICD-10-CM | POA: Diagnosis not present

## 2017-05-12 DIAGNOSIS — R102 Pelvic and perineal pain: Secondary | ICD-10-CM | POA: Diagnosis not present

## 2017-05-12 DIAGNOSIS — R300 Dysuria: Secondary | ICD-10-CM | POA: Diagnosis not present

## 2017-05-12 DIAGNOSIS — M62838 Other muscle spasm: Secondary | ICD-10-CM | POA: Diagnosis not present

## 2017-05-12 DIAGNOSIS — M545 Low back pain: Secondary | ICD-10-CM | POA: Diagnosis not present

## 2017-05-23 DIAGNOSIS — M62838 Other muscle spasm: Secondary | ICD-10-CM | POA: Diagnosis not present

## 2017-05-23 DIAGNOSIS — R102 Pelvic and perineal pain: Secondary | ICD-10-CM | POA: Diagnosis not present

## 2017-05-23 DIAGNOSIS — M545 Low back pain: Secondary | ICD-10-CM | POA: Diagnosis not present

## 2017-05-23 DIAGNOSIS — R300 Dysuria: Secondary | ICD-10-CM | POA: Diagnosis not present

## 2017-05-23 DIAGNOSIS — M6281 Muscle weakness (generalized): Secondary | ICD-10-CM | POA: Diagnosis not present

## 2017-06-02 ENCOUNTER — Other Ambulatory Visit: Payer: Self-pay | Admitting: Family Medicine

## 2017-06-04 DIAGNOSIS — R3912 Poor urinary stream: Secondary | ICD-10-CM | POA: Diagnosis not present

## 2017-06-04 DIAGNOSIS — R300 Dysuria: Secondary | ICD-10-CM | POA: Diagnosis not present

## 2017-06-04 DIAGNOSIS — M6281 Muscle weakness (generalized): Secondary | ICD-10-CM | POA: Diagnosis not present

## 2017-06-04 DIAGNOSIS — R3915 Urgency of urination: Secondary | ICD-10-CM | POA: Diagnosis not present

## 2017-06-04 DIAGNOSIS — R102 Pelvic and perineal pain: Secondary | ICD-10-CM | POA: Diagnosis not present

## 2017-06-04 DIAGNOSIS — M62838 Other muscle spasm: Secondary | ICD-10-CM | POA: Diagnosis not present

## 2017-06-16 DIAGNOSIS — R3915 Urgency of urination: Secondary | ICD-10-CM | POA: Diagnosis not present

## 2017-06-16 DIAGNOSIS — N329 Bladder disorder, unspecified: Secondary | ICD-10-CM | POA: Diagnosis not present

## 2017-06-17 ENCOUNTER — Encounter (HOSPITAL_BASED_OUTPATIENT_CLINIC_OR_DEPARTMENT_OTHER): Payer: Self-pay | Admitting: *Deleted

## 2017-06-17 ENCOUNTER — Other Ambulatory Visit: Payer: Self-pay | Admitting: Urology

## 2017-06-17 NOTE — Progress Notes (Signed)
To [facility] at 0845-Hg on arrival-Npo after Mn-will take wellbutrin, lamictal,levothyroxine with small amt water in am.

## 2017-06-18 DIAGNOSIS — R3915 Urgency of urination: Secondary | ICD-10-CM | POA: Diagnosis not present

## 2017-06-18 DIAGNOSIS — R102 Pelvic and perineal pain: Secondary | ICD-10-CM | POA: Diagnosis not present

## 2017-06-18 DIAGNOSIS — M6281 Muscle weakness (generalized): Secondary | ICD-10-CM | POA: Diagnosis not present

## 2017-06-18 DIAGNOSIS — M62838 Other muscle spasm: Secondary | ICD-10-CM | POA: Diagnosis not present

## 2017-06-18 DIAGNOSIS — N393 Stress incontinence (female) (male): Secondary | ICD-10-CM | POA: Diagnosis not present

## 2017-06-18 DIAGNOSIS — R3912 Poor urinary stream: Secondary | ICD-10-CM | POA: Diagnosis not present

## 2017-06-19 NOTE — H&P (Signed)
Office Visit Report     06/16/2017    Amber Gutierrez         MRN: [id number]  PRIMARY CARE:  Marciano Sequin. Amber Medina, MD  DOB: [birth year], 66 year old Female  REFERRING:  Amber Ralph, NP  SSN: *[phone] [phone]  PROVIDER:  Festus Aloe, M.D.    LOCATION:  [facility] [phone]    CC: Urinary Tract Infections  HPI: Amber Gutierrez is a 66 year-old female established patient who is here for the evaluation of an urinary tract infection.  The patient occasionally gets a number of urinary tract infections. When the patient has a UTI she reports having the following symptoms: dysuria, frequency, and suprapubic pain. When asked how many UTI's the patient gets per year her response was 3-4. The patient's symptoms do respond favorably to antibiotics.   She gets HA, malaise, low back tension and pain with UTI. She will get high fevers.)  -2009 cystoscopy - normal.  -Feb 2012, malaise and fever and voiding symptoms while in [place]. She was hospitalized for UTI. CT scan showed no obstruction and apparently no significant stone disease.  -Jun 2013 - renal/bladder U/S - normal PVR (22 ml), normal kidneys, no obvious stones  -Jul 2014 left flank pain radiating to front, Fever, HA. Cipro helped.  -Aug 2014 - cysto, bbx, fulguration - BENIGN, inflammation  -Nov 2017 - renal US - normal, pvr nl   Had a pseudomonal UTI Feb 2018. She's on cipro now. Symptoms improved. UA wuth a few bacteria.    CC: I have an overactive bladder.  HPI: She first stated noticing pain on approximately 10/30/2016. She is taking Vesicare, Detrol, Oxybutinin, and Myrbetriq for her over active bladder.   Has tired Myrbetirq but had no improvement of sxs. Tried Vesicare with good relief.   Failed Oxybutynin due to rash and swelling, dizziness with Tolterodine. Started PT/OT. This has helped some. She also has a DO appt for full exam. She also has regular Gyn appt - it's coming up.   ALLERGIES: Oxybutynin - Skin Rash,  Swelling Sulfa Drugs - Hives Tolterodine - Dizziness, Other Reaction, dry mouth Tramadol Hcl - Dizziness, Itching   MEDICATIONS: Vesicare 10 mg tablet 1 tablet PO Daily  Ciprofloxacin Hcl 250 mg tablet 1 po BID X 7 days than 1/2 tab po daily  Lamotrigine 200 mg tablet Oral  Ocuvite tablet Oral  Synthroid 112 mcg tablet Oral  Wellbutrin Xl 300 mg tablet, extended release 24 hr Oral  Womens Daily Multivitamin TABS Oral    GU PSH: Cysto Fulgurate < 0.5 cm - 2014 ESWL - 2009   NON-GU PSH: Bmi>=30Or<22 Cal No Followup - 05/12/2017 Cesarean Delivery Only - 2009 Cholecystectomy (open) - 2014 Doc Meds Verified W/Pt Or Re - 05/12/2017 Other Pt/Ot Current Status - 05/12/2017 Other Pt/Ot Goal Status - 05/12/2017 Pain Doc Pos And Plan - 05/12/2017   GU PMH: Pelvic/perineal pain - 06/04/2017, - 05/12/2017 Urinary Urgency - 06/04/2017, (Worsening, Chronic), Will resume Vesicare 10 mg daily. Will request PA for coverage, - 02/18/2017 Weak Urinary Stream - 06/04/2017 Dysuria (Stable) - 05/12/2017, (Worsening, Chronic), Culture urine. No ABX unless culture proven UTI. , - 02/18/2017 Low back pain - 05/12/2017 Urinary incontinence, Unspec - 11/06/2016 Renal calculus, Bilateral kidney stones - 10/06/2015 Urinary Tract Inf, Unspec site, Urinary tract infection - 10/06/2015 Chronic cystitis (w/o hematuria), Chronic cystitis - 06/27/2015 Bladder tumor/neoplasm, Neoplasm of uncertain behavior of bladder - 2014 Hydronephrosis Unspec, Hydronephrosis - 2014 Tubulo-interstitial nephritis, not  specified as acute or chronic, Pyelonephritis - 02-Apr-2013 Ureteral calculus, Ureteral Stone - 04-02-13     PMH Notes:  2013-10-23 05:11:30 - Note: Cholelithiasis, unspecified acuity, unspecified acuity  2008-05-10 10:34:06 - Note: Arthritis   NON-GU PMH: Muscle weakness (generalized) - 06/04/2017, - 05/12/2017 Other muscle spasm - 06/04/2017, - 05/12/2017 Other lack of coordination - 05/12/2017 Encounter for general adult medical examination  without abnormal findings, Encounter for preventive health examination - 10/06/2015 Personal history of other endocrine, nutritional and metabolic disease, History of hypothyroidism - 04/02/2013   FAMILY HISTORY: Death In The Family Father - Father Family Health Status Number - Runs In Family Heart Disease - Father   SOCIAL HISTORY: Marital Status: Married Current Smoking Status: Patient does not smoke anymore. Has not smoked since 10/30/2013.  Social Drinker.  Drinks 1 caffeinated drink per day.   REVIEW OF SYSTEMS:    GU Review Female:   Patient denies frequent urination, hard to postpone urination, burning /pain with urination, get up at night to urinate, leakage of urine, stream starts and stops, trouble starting your stream, have to strain to urinate, and being pregnant.  Gastrointestinal (Upper):   Patient denies vomiting, indigestion/ heartburn, and nausea.  Gastrointestinal (Lower):   Patient denies diarrhea and constipation.  Constitutional:   Patient denies fever, night sweats, weight loss, and fatigue.  Skin:   Patient denies skin rash/ lesion and itching.  Eyes:   Patient denies blurred vision and double vision.  Ears/ Nose/ Throat:   Patient denies sore throat and sinus problems.  Hematologic/Lymphatic:   Patient denies swollen glands and easy bruising.  Cardiovascular:   Patient denies leg swelling and chest pains.  Respiratory:   Patient denies cough and shortness of breath.  Endocrine:   Patient denies excessive thirst.  Musculoskeletal:   Patient denies back pain and joint pain.  Neurological:   Patient denies headaches and dizziness.  Psychologic:   Patient denies depression and anxiety.   VITAL SIGNS:      06/16/2017 10:48 AM  BP 114/72 mmHg  Heart Rate 76 /min  Temperature 98.0 F / 37 C   GU PHYSICAL EXAMINATION:    External Genitalia: Atrophic introitus. No hirsuitism, no rash, no scarring, no cyst, no erythematous lesion, no papular lesion, no blanched lesion, no  warty lesion, no labial adhesions. No edema.   Urethral Meatus: Normal size. Normal position. No discharge. No tic noted.   Urethra: No tenderness, no mass, no scarring. No hypermobility. No leakage.  Bladder: Normal to palpation, no tenderness, no mass, normal size.  Vagina: No atrophy, no stenosis. No rectocele. No cystocele. No enterocele.   MULTI-SYSTEM PHYSICAL EXAMINATION:    Constitutional: Well-nourished. No physical deformities. Normally developed. Good grooming.  Neck: Neck symmetrical, not swollen. Normal tracheal position.  Respiratory: No labored breathing, no use of accessory muscles.   Cardiovascular: Normal temperature, normal extremity pulses, no swelling, no varicosities.  Neurologic / Psychiatric: Oriented to time, oriented to place, oriented to person. No depression, no anxiety, no agitation.    PAST DATA REVIEWED:  Source Of History:  Patient   PROCEDURES:         Flexible Cystoscopy - 52000  Risks, benefits, and some of the potential complications of the procedure were discussed at length with the patient including infection, bleeding, voiding discomfort, urinary retention, fever, chills, sepsis, and others. All questions were answered. Informed consent was obtained. Antibiotic prophylaxis was given. Sterile technique and intraurethral analgesia were used. Chaperone - Shanita - for exam and  cystoscopy.   Meatus:  Normal size. Normal location. Normal condition.  Urethra:  Scarring present - difficult to get scope in - ? sx vs pelvic floor tightness. No hypermobility. No leakage.  Ureteral Orifices:  Normal location. Normal size. Normal shape. Effluxed clear urine.  Bladder:  Hunner's ulcers - two near dome - may have exudate vs granulation. No trabeculation. No tumors. No stones.      The lower urinary tract was carefully examined. The procedure was well-tolerated and without complications. Antibiotic instructions were given. Instructions were given to call the office  immediately for bloody urine, difficulty urinating, urinary retention, painful or frequent urination, fever, chills, nausea, vomiting or other illness. The patient stated that she understood these instructions and would comply with them.        Urinalysis w/Scope Dipstick Dipstick Cont'd Micro  Color: Yellow Bilirubin: Neg WBC/hpf: >60/hpf  Appearance: Cloudy Ketones: Neg RBC/hpf: 10 - 20/hpf  Specific Gravity: 1.025 Blood: 2+ Bacteria: Few (10-25/hpf)  pH: 6.0 Protein: 1+ Cystals: NS (Not Seen)  Glucose: Neg Urobilinogen: 0.2 Casts: NS (Not Seen)    Nitrites: Positive Trichomonas: Not Present    Leukocyte Esterase: 3+ Mucous: Not Present      Epithelial Cells: 0 - 5/hpf      Yeast: NS (Not Seen)      Sperm: Not Present   ASSESSMENT:      ICD-10 Details  1 GU:   Urinary Urgency - R39.15   2   Bladder disorder, Unspec - N32.9    PLAN:           Medications   Stop Meds: Uribel 118 mg-10 mg-40.8 mg-36 mg-0.12 mg capsule 1 capsule PO TID PRN  Start: 04/25/2017  Discontinue: 06/16/2017  - Reason: The medication cycle was completed.          Orders Labs Urine Culture         Schedule Return Visit/Planned Activity: Next Available Appointment - Office Visit, Schedule Surgery         Document Letter(s):  Created for Patient: Clinical Summary        Notes:   Recurrent UTI-on cystoscopy today patient has 2 areas near the dome that appeared ulcerated and have either some granulation tissue or exudate. We discussed the nature risk and benefits of cystoscopy with bladder biopsy and fulguration. All questions answered and she elects to proceed. Hopefully this will help some of her pain as well, but I made no guarantees. Trial of Azo.    cc: Dr. Deborra Gutierrez   ** Signed by Festus Aloe, M.D. on 06/16/17 at 10:09 PM (EDT)**     The information contained in this medical record document is considered private and confidential patient information. This information can only be used for the medical  diagnosis and/or medical services that are being provided by the patient's selected caregivers. This information can only be distributed outside of the patient's care if the patient agrees and signs waivers of authorization for this information to be sent to an outside source or route.   Add: urine cx positive for Pseudomonas by it became resistant to the Cipro so I sent Vantin today.  Aztreonam planned for OR.

## 2017-06-20 ENCOUNTER — Ambulatory Visit (HOSPITAL_BASED_OUTPATIENT_CLINIC_OR_DEPARTMENT_OTHER): Payer: PPO | Admitting: Anesthesiology

## 2017-06-20 ENCOUNTER — Encounter (HOSPITAL_BASED_OUTPATIENT_CLINIC_OR_DEPARTMENT_OTHER)
Admission: RE | Disposition: A | Discharge status: Home / Self Care | Payer: Self-pay | Source: Ambulatory Visit | Attending: Urology

## 2017-06-20 ENCOUNTER — Encounter (HOSPITAL_BASED_OUTPATIENT_CLINIC_OR_DEPARTMENT_OTHER): Payer: Self-pay | Admitting: *Deleted

## 2017-06-20 ENCOUNTER — Ambulatory Visit (HOSPITAL_BASED_OUTPATIENT_CLINIC_OR_DEPARTMENT_OTHER)
Admission: RE | Admit: 2017-06-20 | Discharge: 2017-06-20 | Disposition: A | Discharge status: Home / Self Care | Payer: PPO | Source: Ambulatory Visit | Attending: Urology | Admitting: Urology

## 2017-06-20 DIAGNOSIS — N3281 Overactive bladder: Secondary | ICD-10-CM | POA: Insufficient documentation

## 2017-06-20 DIAGNOSIS — Z87891 Personal history of nicotine dependence: Secondary | ICD-10-CM | POA: Insufficient documentation

## 2017-06-20 DIAGNOSIS — Z882 Allergy status to sulfonamides status: Secondary | ICD-10-CM | POA: Insufficient documentation

## 2017-06-20 DIAGNOSIS — Z79899 Other long term (current) drug therapy: Secondary | ICD-10-CM | POA: Insufficient documentation

## 2017-06-20 DIAGNOSIS — Z6841 Body Mass Index (BMI) 40.0 and over, adult: Secondary | ICD-10-CM | POA: Insufficient documentation

## 2017-06-20 DIAGNOSIS — E039 Hypothyroidism, unspecified: Secondary | ICD-10-CM | POA: Diagnosis not present

## 2017-06-20 DIAGNOSIS — Z885 Allergy status to narcotic agent status: Secondary | ICD-10-CM | POA: Diagnosis not present

## 2017-06-20 DIAGNOSIS — Z888 Allergy status to other drugs, medicaments and biological substances status: Secondary | ICD-10-CM | POA: Diagnosis not present

## 2017-06-20 DIAGNOSIS — N3289 Other specified disorders of bladder: Secondary | ICD-10-CM | POA: Diagnosis not present

## 2017-06-20 DIAGNOSIS — F319 Bipolar disorder, unspecified: Secondary | ICD-10-CM | POA: Insufficient documentation

## 2017-06-20 DIAGNOSIS — Z87440 Personal history of urinary (tract) infections: Secondary | ICD-10-CM | POA: Insufficient documentation

## 2017-06-20 DIAGNOSIS — Z792 Long term (current) use of antibiotics: Secondary | ICD-10-CM | POA: Diagnosis not present

## 2017-06-20 DIAGNOSIS — E785 Hyperlipidemia, unspecified: Secondary | ICD-10-CM | POA: Diagnosis not present

## 2017-06-20 DIAGNOSIS — N3090 Cystitis, unspecified without hematuria: Secondary | ICD-10-CM | POA: Diagnosis not present

## 2017-06-20 DIAGNOSIS — N390 Urinary tract infection, site not specified: Secondary | ICD-10-CM | POA: Insufficient documentation

## 2017-06-20 DIAGNOSIS — D494 Neoplasm of unspecified behavior of bladder: Secondary | ICD-10-CM | POA: Diagnosis not present

## 2017-06-20 HISTORY — PX: CYSTOSCOPY WITH BIOPSY: SHX5122

## 2017-06-20 LAB — POCT HEMOGLOBIN-HEMACUE: Hemoglobin: 14.6 g/dL (ref 12.0–15.0)

## 2017-06-20 SURGERY — CYSTOSCOPY, WITH BIOPSY
Anesthesia: General

## 2017-06-20 MED ORDER — ACETAMINOPHEN 10 MG/ML IV SOLN
INTRAVENOUS | Status: DC | PRN
  Administered 2017-06-20: 1000 mg via INTRAVENOUS

## 2017-06-20 MED ORDER — FENTANYL CITRATE (PF) 100 MCG/2ML IJ SOLN
INTRAMUSCULAR | Status: DC | PRN
  Administered 2017-06-20 (×2): 50 ug via INTRAVENOUS
  Administered 2017-06-20: 25 ug via INTRAVENOUS
  Administered 2017-06-20: 50 ug via INTRAVENOUS
  Administered 2017-06-20: 100 ug via INTRAVENOUS
  Administered 2017-06-20 (×5): 25 ug via INTRAVENOUS

## 2017-06-20 MED ORDER — FENTANYL CITRATE (PF) 100 MCG/2ML IJ SOLN
INTRAMUSCULAR | Status: AC
  Filled 2017-06-20: qty 2

## 2017-06-20 MED ORDER — LACTATED RINGERS IV SOLN
INTRAVENOUS | Status: DC
  Administered 2017-06-20 (×2): via INTRAVENOUS
  Filled 2017-06-20: qty 1000

## 2017-06-20 MED ORDER — PROPOFOL 10 MG/ML IV BOLUS
INTRAVENOUS | Status: DC | PRN
Start: 2017-06-20 — End: 2017-06-20
  Administered 2017-06-20: 50 mg via INTRAVENOUS
  Administered 2017-06-20: 150 mg via INTRAVENOUS

## 2017-06-20 MED ORDER — LIDOCAINE 2% (20 MG/ML) 5 ML SYRINGE
INTRAMUSCULAR | Status: AC
  Filled 2017-06-20: qty 5

## 2017-06-20 MED ORDER — PROMETHAZINE HCL 25 MG/ML IJ SOLN
6.2500 mg | INTRAMUSCULAR | Status: DC | PRN
  Filled 2017-06-20: qty 1

## 2017-06-20 MED ORDER — FENTANYL CITRATE (PF) 100 MCG/2ML IJ SOLN
INTRAMUSCULAR | Status: AC
  Filled 2017-06-20: qty 4

## 2017-06-20 MED ORDER — DEXTROSE 5 % IV SOLN
2.0000 g | Freq: Once | INTRAVENOUS | Status: AC
  Administered 2017-06-20: 2 g via INTRAVENOUS
  Filled 2017-06-20 (×2): qty 2

## 2017-06-20 MED ORDER — PROPOFOL 10 MG/ML IV BOLUS
INTRAVENOUS | Status: AC
  Filled 2017-06-20: qty 20

## 2017-06-20 MED ORDER — SCOPOLAMINE 1 MG/3DAYS TD PT72
1.0000 | MEDICATED_PATCH | Freq: Once | TRANSDERMAL | Status: DC
  Filled 2017-06-20: qty 1

## 2017-06-20 MED ORDER — HYDROMORPHONE HCL 1 MG/ML IJ SOLN
0.5000 mg | INTRAMUSCULAR | Status: DC | PRN
  Administered 2017-06-20: 0.5 mg via INTRAVENOUS
  Filled 2017-06-20: qty 0.5

## 2017-06-20 MED ORDER — FENTANYL CITRATE (PF) 100 MCG/2ML IJ SOLN
25.0000 ug | INTRAMUSCULAR | Status: DC | PRN
  Filled 2017-06-20: qty 1

## 2017-06-20 MED ORDER — DEXAMETHASONE SODIUM PHOSPHATE 4 MG/ML IJ SOLN
INTRAMUSCULAR | Status: DC | PRN
  Administered 2017-06-20: 10 mg via INTRAVENOUS

## 2017-06-20 MED ORDER — MEPERIDINE HCL 25 MG/ML IJ SOLN
6.2500 mg | INTRAMUSCULAR | Status: DC | PRN
  Filled 2017-06-20: qty 1

## 2017-06-20 MED ORDER — PHENAZOPYRIDINE HCL 200 MG PO TABS
ORAL_TABLET | ORAL | Status: DC | PRN
  Administered 2017-06-20: 15 mL via INTRAVESICAL

## 2017-06-20 MED ORDER — KETOROLAC TROMETHAMINE 30 MG/ML IJ SOLN
INTRAMUSCULAR | Status: DC | PRN
  Administered 2017-06-20: 30 mg via INTRAVENOUS

## 2017-06-20 MED ORDER — HYDROMORPHONE HCL 1 MG/ML IJ SOLN
INTRAMUSCULAR | Status: AC
  Filled 2017-06-20: qty 0.5

## 2017-06-20 MED ORDER — ONDANSETRON HCL 4 MG/2ML IJ SOLN
INTRAMUSCULAR | Status: AC
  Filled 2017-06-20: qty 2

## 2017-06-20 MED ORDER — ONDANSETRON HCL 4 MG/2ML IJ SOLN
INTRAMUSCULAR | Status: DC | PRN
  Administered 2017-06-20: 4 mg via INTRAVENOUS

## 2017-06-20 MED ORDER — DEXAMETHASONE SODIUM PHOSPHATE 10 MG/ML IJ SOLN
INTRAMUSCULAR | Status: AC
Start: 2017-06-20 — End: 2017-06-20
  Filled 2017-06-20: qty 1

## 2017-06-20 MED ORDER — LIDOCAINE 2% (20 MG/ML) 5 ML SYRINGE
INTRAMUSCULAR | Status: DC | PRN
  Administered 2017-06-20: 40 mg via INTRAVENOUS

## 2017-06-20 MED ORDER — MIDAZOLAM HCL 2 MG/2ML IJ SOLN
0.5000 mg | Freq: Once | INTRAMUSCULAR | Status: DC | PRN
  Filled 2017-06-20: qty 2

## 2017-06-20 MED ORDER — CEFPODOXIME PROXETIL 100 MG PO TABS: 100.0000 mg | ORAL_TABLET | Freq: Every day | ORAL | 0 refills | Status: DC

## 2017-06-20 SURGICAL SUPPLY — 22 items
BAG DRAIN URO-CYSTO SKYTR STRL (DRAIN) ×3 IMPLANT
BAG URINE DRAINAGE (UROLOGICAL SUPPLIES) IMPLANT
BAG URINE LEG 19OZ MD ST LTX (BAG) IMPLANT
BAG URINE LEG 500ML (DRAIN) IMPLANT
CATH FOLEY 2WAY SLVR  5CC 16FR (CATHETERS)
CATH FOLEY 2WAY SLVR 5CC 16FR (CATHETERS) IMPLANT
CATH ROBINSON RED A/P 14FR (CATHETERS) ×3 IMPLANT
CLOTH BEACON ORANGE TIMEOUT ST (SAFETY) ×3 IMPLANT
ELECT REM PT RETURN 9FT ADLT (ELECTROSURGICAL) ×3
ELECTRODE REM PT RTRN 9FT ADLT (ELECTROSURGICAL) ×1 IMPLANT
GLOVE BIO SURGEON STRL SZ7.5 (GLOVE) ×3 IMPLANT
GOWN STRL REUS W/ TWL XL LVL3 (GOWN DISPOSABLE) ×1 IMPLANT
GOWN STRL REUS W/TWL XL LVL3 (GOWN DISPOSABLE) ×2
KIT RM TURNOVER CYSTO AR (KITS) ×3 IMPLANT
MANIFOLD NEPTUNE II (INSTRUMENTS) IMPLANT
NEEDLE HYPO 22GX1.5 SAFETY (NEEDLE) IMPLANT
NS IRRIG 500ML POUR BTL (IV SOLUTION) IMPLANT
PACK CYSTO (CUSTOM PROCEDURE TRAY) ×3 IMPLANT
SYRINGE 20CC LL (MISCELLANEOUS) ×3 IMPLANT
TUBE CONNECTING 12'X1/4 (SUCTIONS)
TUBE CONNECTING 12X1/4 (SUCTIONS) IMPLANT
WATER STERILE IRR 3000ML UROMA (IV SOLUTION) ×6 IMPLANT

## 2017-06-20 NOTE — Anesthesia Postprocedure Evaluation (Addendum)
Anesthesia Post Note  Patient: Amber Gutierrez  Procedure(s) Performed: Procedure(s) (LRB): CYSTOSCOPY WITH BIOPSY AND FULGURATION (N/A)     Patient location during evaluation: PACU Anesthesia Type: General Level of consciousness: awake and alert, patient cooperative and oriented Pain management: pain level controlled (pt's hip pain on emergence has markedly improved) Vital Signs Assessment: post-procedure vital signs reviewed and stable Respiratory status: spontaneous breathing, nonlabored ventilation and respiratory function stable Cardiovascular status: blood pressure returned to baseline and stable Postop Assessment: no signs of nausea or vomiting Anesthetic complications: no    Last Vitals:  Vitals:   06/20/17 1200 06/20/17 1215  BP: 136/83 (!) 145/90  Pulse: 75 71  Resp: 11 18  Temp:      Last Pain:  Vitals:   06/20/17 1215  TempSrc:   PainSc: 4                  Dulce Martian,E. [place] Mullett

## 2017-06-20 NOTE — Anesthesia Preprocedure Evaluation (Addendum)
Anesthesia Evaluation  Patient identified by MRN, date of birth, ID band Patient awake    Reviewed: Allergy & Precautions, NPO status , Patient's Chart, lab work & pertinent test results  History of Anesthesia Complications Negative for: history of anesthetic complications  Airway Mallampati: I  TM Distance: >3 FB Neck ROM: Full    Dental  (+) Edentulous Upper, Edentulous Lower   Pulmonary Current Smoker,    breath sounds clear to auscultation       Cardiovascular (-) anginanegative cardio ROS   Rhythm:Regular Rate:Normal     Neuro/Psych Bipolar Disorder Chronic back pain    GI/Hepatic negative GI ROS, Neg liver ROS,   Endo/Other  Hypothyroidism Morbid obesity  Renal/GU negative Renal ROSstones   Bladder neoplasm    Musculoskeletal  (+) Arthritis ,   Abdominal (+) + obese,   Peds  Hematology negative hematology ROS (+)   Anesthesia Other Findings   Reproductive/Obstetrics                            Anesthesia Physical Anesthesia Plan  ASA: II  Anesthesia Plan: General   Post-op Pain Management:    Induction: Intravenous  PONV Risk Score and Plan: 4 or greater and Ondansetron, Dexamethasone, Propofol, Midazolam and Scopolamine patch - Pre-op  Airway Management Planned: LMA  Additional Equipment:   Intra-op Plan:   Post-operative Plan:   Informed Consent: I have reviewed the patients History and Physical, chart, labs and discussed the procedure including the risks, benefits and alternatives for the proposed anesthesia with the patient or authorized representative who has indicated his/her understanding and acceptance.     Plan Discussed with: CRNA and Surgeon  Anesthesia Plan Comments: (Plan routine monitors, GA- LMA OK)        Anesthesia Quick Evaluation

## 2017-06-20 NOTE — Transfer of Care (Signed)
Immediate Anesthesia Transfer of Care Note  Patient: Amber Gutierrez  Procedure(s) Performed: Procedure(s) (LRB): CYSTOSCOPY WITH BIOPSY AND FULGURATION (N/A)  Patient Location: PACU  Anesthesia Type: General  Level of Consciousness: awake, sedated, patient cooperative and responds to stimulation  Airway & Oxygen Therapy: Patient Spontanous Breathing and Patient connected to face mask oxygen  Post-op Assessment: Report given to PACU RN, Post -op Vital signs reviewed and stable and Patient moving all extremities  Post vital signs: Reviewed and stable  Complications: No apparent anesthesia complications

## 2017-06-20 NOTE — Discharge Instructions (Signed)
Cystoscopy, Care After °Refer to this sheet in the next few weeks. These instructions provide you with information about caring for yourself after your procedure. Your health care provider may also give you more specific instructions. Your treatment has been planned according to current medical practices, but problems sometimes occur. Call your health care provider if you have any problems or questions after your procedure. °What can I expect after the procedure? °After the procedure, it is common to have: °· Mild pain when you urinate. Pain should stop within a few minutes after you urinate. This may last for up to 1 week. °· A small amount of blood in your urine for several days. °· Feeling like you need to urinate but producing only a small amount of urine. ° °Follow these instructions at home: ° °Medicines °· Take over-the-counter and prescription medicines only as told by your health care provider. °· If you were prescribed an antibiotic medicine, take it as told by your health care provider. Do not stop taking the antibiotic even if you start to feel better. °General instructions ° °· Return to your normal activities as told by your health care provider. Ask your health care provider what activities are safe for you. °· Do not drive for 24 hours if you received a sedative. °· Watch for any blood in your urine. If the amount of blood in your urine increases, call your health care provider. °· Follow instructions from your health care provider about eating or drinking restrictions. °· If a tissue sample was removed for testing (biopsy) during your procedure, it is your responsibility to get your test results. Ask your health care provider or the department performing the test when your results will be ready. °· Drink enough fluid to keep your urine clear or pale yellow. °· Keep all follow-up visits as told by your health care provider. This is important. °Contact a health care provider if: °· You have pain that  gets worse or does not get better with medicine, especially pain when you urinate. °· You have difficulty urinating. °Get help right away if: °· You have more blood in your urine. °· You have blood clots in your urine. °· You have abdominal pain. °· You have a fever or chills. °· You are unable to urinate. °This information is not intended to replace advice given to you by your health care provider. Make sure you discuss any questions you have with your health care provider. °Document Released: 07/05/2005 Document Revised: 05/23/2016 Document Reviewed: 11/02/2015 °Elsevier Interactive Patient Education © 2017 [employer] ° °Post Anesthesia Home Care Instructions ° °Activity: °Get plenty of rest for the remainder of the day. A responsible individual must stay with you for 24 hours following the procedure.  °For the next 24 hours, DO NOT: °-Drive a car °-Operate machinery °-Drink alcoholic beverages °-Take any medication unless instructed by your physician °-Make any legal decisions or sign important papers. ° °Meals: °Start with liquid foods such as gelatin or soup. Progress to regular foods as tolerated. Avoid greasy, spicy, heavy foods. If nausea and/or vomiting occur, drink only clear liquids until the nausea and/or vomiting subsides. Call your physician if vomiting continues. ° °Special Instructions/Symptoms: °Your throat may feel dry or sore from the anesthesia or the breathing tube placed in your throat during surgery. If this causes discomfort, gargle with warm salt water. The discomfort should disappear within 24 hours. ° °If you had a scopolamine patch placed behind your ear for the management of post-   of post- operative nausea and/or vomiting: ° °1. The medication in the patch is effective for 72 hours, after which it should be removed.  Wrap patch in a tissue and discard in the trash. Wash hands thoroughly with soap and water. °2. You may remove the patch earlier than 72 hours if you experience unpleasant  side effects which may include dry mouth, dizziness or visual disturbances. °3. Avoid touching the patch. Wash your hands with soap and water after contact with the patch. °  ° °

## 2017-06-20 NOTE — Interval H&P Note (Signed)
History and Physical Interval Note:  06/20/2017 9:10 AM  Amber Gutierrez  has presented today for surgery, with the diagnosis of BLADDER ULCER  The various methods of treatment have been discussed with the patient and family. After consideration of risks, benefits and other options for treatment, the patient has consented to  Procedure(s): CYSTOSCOPY WITH BIOPSY AND FULGURATION (N/A) as a surgical intervention .  The patient's history has been reviewed, patient examined, no change in status, stable for surgery.  I have reviewed the patient's chart and labs.  Questions were answered to the patient's satisfaction.  She took Vantin last night and this AM. Chronic dysuria persists. No fever. She looks well. Discussed she may need a foley over the weekend.    Raylon Lamson

## 2017-06-20 NOTE — Op Note (Addendum)
Preoperative diagnosis: Recurrent urinary tract infection, bladder erythema  Postoperative diagnosis: Recurrent urinary tract infection, bladder erythema and squamous metaplasia   Procedure: Cystoscopy, bladder irrigation, bladder biopsy and fulguration 0.5 cm  Surgeon: Junious Silk  Anesthesia: Gen.  Indication for procedure: 66 year old white female with recurrent UTI and dysuria. She had erythematous and areas of possible ulcer or granulation tissue. She was brought for exam under anesthesia and bladder biopsy.  Findings: On exam under anesthesia the Foley bulb was normal without lesion. The introitus showed some atrophic vaginitis but no lesions. The meatus appeared normal. On palpation of the urethra there was no mass or cystic structures, there was no discharge or evidence of urethral diverticulum. The bladder was palpably normal. On cystoscopic exam there were patches of erythema and squamous metaplasia as expected on the trigone also an area on the right bladder, left bladder, left superior, left bladder neck. The ureteral orifices were in their normal orthotopic position with clear efflux bilaterally. There were no papillary tumors or solid masses. There were no stones or foreign bodies in the bladder. Most of the squamous metaplasia type tissue simply scraped her wiped off and I tried to eliminate as much of this tissue as possible so there would be no bacterial foothold. The urethra was inspected on the way in and on the way out and again no sign of any urethral diverticulum.  Description of procedure: After consent was obtained patient brought to the operating room. After adequate anesthesia she was placed in lithotomy position and prepped and draped in the usual sterile fashion. A timeout was performed to confirm the patient and procedure. An exam under anesthesia was performed. The cystoscope was passed per urethra and the bladder and the bladder was copiously irrigated until clear. The  bladder was carefully inspected with the 30 and 70 lens. I then took the flexible biopsy forceps and biopsied the right bladder and the left bladder. These were fulgurated -- fulguration was done for maintaining hemostasis at the biopsy sites. Much of the squamous metaplasia type tissue simply wiped off when brushed with the flexible biopsy forceps. The bladder looked much better and seemed to have a lot less extra adherent tissue. Hemostasis was excellent. I didn't see any areas suspicious of a fistula. The bladder wall seems smooth. There were no worrisome findings. A red rubber catheter was inserted in 15 mL of Marcaine 0.5% with Pyridium was instilled per urethra and the catheter removed. She was awakened and taken to the recovery room in stable condition.  Complications: None  Blood loss: Minimal  Specimens to pathology: #1 right bladder biopsy #2 left bladder biopsy  Drains: None  Disposition: Patient stable to PACU

## 2017-06-20 NOTE — Anesthesia Procedure Notes (Signed)
Procedure Name: LMA Insertion Date/Time: 06/20/2017 10:26 AM Performed by: Justice Rocher Pre-anesthesia Checklist: Patient identified, Emergency Drugs available, Suction available and Patient being monitored Patient Re-evaluated:Patient Re-evaluated prior to inductionOxygen Delivery Method: Circle system utilized Preoxygenation: Pre-oxygenation with 100% oxygen Intubation Type: IV induction Ventilation: Mask ventilation without difficulty LMA: LMA inserted LMA Size: 4.0 Number of attempts: 1 Airway Equipment and Method: Bite block Placement Confirmation: positive ETCO2 Tube secured with: Tape Dental Injury: Teeth and Oropharynx as per pre-operative assessment  Comments: Ramped with gray wedge shoulder support, pillows, gel head rest - PreO2 prior to induction

## 2017-06-23 ENCOUNTER — Encounter (HOSPITAL_BASED_OUTPATIENT_CLINIC_OR_DEPARTMENT_OTHER): Payer: Self-pay | Admitting: Urology

## 2017-06-23 LAB — URINE CULTURE: Culture: 40000 — AB

## 2017-07-03 DIAGNOSIS — N3020 Other chronic cystitis without hematuria: Secondary | ICD-10-CM | POA: Diagnosis not present

## 2017-07-03 DIAGNOSIS — R3915 Urgency of urination: Secondary | ICD-10-CM | POA: Diagnosis not present

## 2017-07-03 DIAGNOSIS — R8271 Bacteriuria: Secondary | ICD-10-CM | POA: Diagnosis not present

## 2017-07-03 DIAGNOSIS — M62838 Other muscle spasm: Secondary | ICD-10-CM | POA: Diagnosis not present

## 2017-07-03 DIAGNOSIS — R3912 Poor urinary stream: Secondary | ICD-10-CM | POA: Diagnosis not present

## 2017-07-03 DIAGNOSIS — M6281 Muscle weakness (generalized): Secondary | ICD-10-CM | POA: Diagnosis not present

## 2017-07-03 DIAGNOSIS — R102 Pelvic and perineal pain: Secondary | ICD-10-CM | POA: Diagnosis not present

## 2017-07-03 DIAGNOSIS — N393 Stress incontinence (female) (male): Secondary | ICD-10-CM | POA: Diagnosis not present

## 2017-07-31 DIAGNOSIS — N390 Urinary tract infection, site not specified: Secondary | ICD-10-CM | POA: Diagnosis not present

## 2017-08-12 ENCOUNTER — Ambulatory Visit (INDEPENDENT_AMBULATORY_CARE_PROVIDER_SITE_OTHER): Payer: PPO | Admitting: Family Medicine

## 2017-08-12 ENCOUNTER — Encounter: Payer: Self-pay | Admitting: Family Medicine

## 2017-08-12 VITALS — BP 138/88 | HR 67 | Resp 20 | Wt 236.0 lb

## 2017-08-12 DIAGNOSIS — Z87440 Personal history of urinary (tract) infections: Secondary | ICD-10-CM | POA: Diagnosis not present

## 2017-08-12 DIAGNOSIS — E890 Postprocedural hypothyroidism: Secondary | ICD-10-CM | POA: Diagnosis not present

## 2017-08-12 LAB — TSH: TSH: 2.84 u[IU]/mL (ref 0.35–4.50)

## 2017-08-12 LAB — T4, FREE: FREE T4: 1.31 ng/dL (ref 0.60–1.60)

## 2017-08-12 NOTE — Progress Notes (Addendum)
Subjective:   Patient ID: Amber Gutierrez, female    DOB: 15-May-1951, 66 y.o.   MRN: [id number]  Amber Gutierrez is a pleasant 66 y.o. year old female who presents to clinic today with Advice Only (RECURRENT UTI'S)  on 08/12/2017  HPI:  Has seen multiple urologists for this complaint.  Most recently, saw Dr. Milford Cage with [facility] on 07/31/17.  Note reviewed.  She feels she has had recurrent UTIs and pelvic pain since 02/2017.  Was seen by Dr. Junious Silk at [facility]- most infections have grown pseduomonas.  Cystoscopy on 06/20/17- showed urothelium inflammation but no malignancy.  Uribel only provides some relief.  Dr. Elmyra Ricks plan was as follows:    PLAN: Orders Placed This Encounter  Procedures  . Culture, Urine Urine  Standing Status: Future  Number of Occurrences: 1  Standing Expiration Date: 07/31/2018  . POCT urinalysis dipstick  catheter urine was sent for culture. She does have a culture pending at a line serology which probably will be back tomorrow. I recommended she follow up on sensitivities with this as she may need intravenous therapy based on the resistance pattern seen on her prior cultures. I told her that sometimes we need infectious disease to get involved for PICC line for outpatient therapy for this. Given her continued pseudomonal UTI think she may benefit from having a CT scan of the abdomen and pelvis just to exclude any sort of GI tract abnormality. She does not claim any history of diverticular disease or hematuria but think she should probably have colonic evaluation.we will follow-up with her catheter culture when available.  Per pt, having a CT scan this Thursday with Dr. Junious Silk. He is still considering Infectious Disease.  She would like to get her thyroid levels checked. She is on synthroid 112 mcg daily.  Has gained weight but feels this is more due to being sick for the past several months.  Current Outpatient Prescriptions on File Prior to  Visit  Medication Sig Dispense Refill  . beta carotene w/minerals (OCUVITE) tablet Take 1 tablet by mouth daily.    Marland Kitchen buPROPion (WELLBUTRIN XL) 300 MG 24 hr tablet Take 300 mg by mouth every morning.     . cefpodoxime (VANTIN) 100 MG tablet Take 1 tablet (100 mg total) by mouth at bedtime. 14 tablet 0  . LamoTRIgine (LAMICTAL XR) 300 MG TB24 Take 1 tablet by mouth every morning.    Marland Kitchen levothyroxine (SYNTHROID, LEVOTHROID) 112 MCG tablet Take 1 tablet (112 mcg total) by mouth daily with breakfast. 90 tablet 3  . Multiple Vitamin (MULTIVITAMIN WITH MINERALS) TABS Take 2 tablets by mouth daily.    . naproxen sodium (ANAPROX) 220 MG tablet Take 220 mg by mouth daily as needed. For pain    . Probiotic Product (PROBIOTIC PO) Take 1 tablet by mouth at bedtime.     . solifenacin (VESICARE) 10 MG tablet Take by mouth daily.     No current facility-administered medications on file prior to visit.     Allergies  Allergen Reactions  . Pravastatin Sodium Other (See Comments)     Muscle pain and weakness  . Sulfa Antibiotics Hives    Past Medical History:  Diagnosis Date  . Allergic rhinitis   . Bipolar 2 disorder ([place])   . Bladder neoplasm   . Chronic cystitis   . DJD (degenerative joint disease) of lumbar spine    has seen Dr. Joya Salm  . History of basal cell carcinoma excision  2014  --forehead  . History of nephrolithiasis    lithotrypsy calcium stone  . History of radiation therapy    thyroid polyps  1992  . History of recurrent UTIs   . Other postablative hypothyroidism    HX  RAI X2    Past Surgical History:  Procedure Laterality Date  . [place]  . CHOLECYSTECTOMY  08/20/2012   Procedure: LAPAROSCOPIC CHOLECYSTECTOMY WITH INTRAOPERATIVE CHOLANGIOGRAM;  Surgeon: Gayland Curry, MD,FACS;  Location: [place];  Service: General;  Laterality: N/A;  . CYSTOSCOPY WITH BIOPSY N/A 08/27/2013   Procedure: CYSTOSCOPY WITH BLADDER BIOPSY/FULGRATION;  Surgeon: Fredricka Bonine, MD;  Location: [facility];  Service: Urology;  Laterality: N/A;  . CYSTOSCOPY WITH BIOPSY N/A 06/20/2017   Procedure: CYSTOSCOPY WITH BIOPSY AND FULGURATION;  Surgeon: Festus Aloe, MD;  Location[facility];  Service: Urology;  Laterality: N/A;  . EXTRACORPOREAL SHOCK WAVE LITHOTRIPSY  2010  . MASS EXCISION  08/20/2012   Procedure: EXCISION MASS;  Surgeon: Gayland Curry, MD,FACS;  Location: [place];  Service: General;  Laterality: N/A;  . TUBAL LIGATION  1976    Family History  Problem Relation Age of Onset  . Heart attack Father        66  deceased  . Alcohol abuse Father   . Breast cancer Sister   . Gallbladder disease Sister   . Bipolar disorder Mother   . Cancer Maternal Grandmother        colon    Social History   Social History  . Marital status: Married    Spouse name: N/A  . Number of children: N/A  . Years of education: N/A   Occupational History  . Not on file.   Social History Main Topics  . Smoking status: Former Smoker    Packs/day: 0.50    Years: 45.00    Types: Cigarettes    Quit date: 06/29/2012  . Smokeless tobacco: Never Used  . Alcohol use Yes     Comment: socially  . Drug use: No  . Sexual activity: No   Other Topics Concern  . Not on file   Social History Narrative   Married husband relapsing and remitting  MS. 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   Review of Systems  Constitutional: Positive for unexpected weight change.  HENT: Negative.   Respiratory: Negative.   Cardiovascular: Negative.   Gastrointestinal: Negative.   Endocrine: Negative.   Genitourinary: Positive for difficulty urinating, frequency and urgency.    Allergic/Immunologic: Negative.   Neurological: Negative.   Hematological: Negative.   Psychiatric/Behavioral: Negative.   All other systems reviewed and are negative.      Objective:    BP 138/88   Pulse 67   Resp 20   Wt 236 lb (107 kg)   SpO2 98%   BMI 43.16 kg/m    Physical Exam   General:  Well-developed,well-nourished,in no acute distress; alert,appropriate and cooperative throughout examination Head:  normocephalic and atraumatic.   Eyes:  vision grossly intact, PERRL Ears:  R ear normal and L ear normal externally, TMs clear bilaterally Nose:  no external deformity.   Mouth:  good  dentition.   Neck:  No deformities, masses, or tenderness noted. Lungs:  Normal respiratory effort, chest expands symmetrically. Lungs are clear to auscultation, no crackles or wheezes. Heart:  Normal rate and regular rhythm. S1 and S2 normal without gallop, murmur, click, rub or other extra sounds. Msk:  No deformity or scoliosis noted of thoracic or lumbar spine.   Extremities:  No clubbing, cyanosis, edema, or deformity noted with normal full range of motion of all joints.   Neurologic:  alert & oriented X3 and gait normal.   Skin:  Intact without suspicious lesions or rashes Psych:  Cognition and judgment appear intact. Alert and cooperative with normal attention span and concentration. No apparent delusions, illusions, hallucinations        Assessment & Plan:   History of recurrent UTIs  Postoperative hypothyroidism - Plan: TSH, T4, Free No Follow-up on file.

## 2017-08-12 NOTE — Assessment & Plan Note (Signed)
Check thyroid function today. 

## 2017-08-12 NOTE — Assessment & Plan Note (Signed)
>  25 minutes spent in face to face time with patient, >50% spent in counselling or coordination of care discussing what she has been through and the next steps.  Amber Gutierrez wanted me to be aware of what was going on.  I do agree ID may be warranted at this time.

## 2017-08-14 DIAGNOSIS — N390 Urinary tract infection, site not specified: Secondary | ICD-10-CM | POA: Diagnosis not present

## 2017-08-14 DIAGNOSIS — N200 Calculus of kidney: Secondary | ICD-10-CM | POA: Diagnosis not present

## 2017-08-25 DIAGNOSIS — N3020 Other chronic cystitis without hematuria: Secondary | ICD-10-CM | POA: Diagnosis not present

## 2017-08-26 DIAGNOSIS — R300 Dysuria: Secondary | ICD-10-CM | POA: Diagnosis not present

## 2017-08-27 DIAGNOSIS — R300 Dysuria: Secondary | ICD-10-CM | POA: Diagnosis not present

## 2017-09-02 ENCOUNTER — Ambulatory Visit (INDEPENDENT_AMBULATORY_CARE_PROVIDER_SITE_OTHER): Payer: PPO | Admitting: Internal Medicine

## 2017-09-02 ENCOUNTER — Encounter: Payer: Self-pay | Admitting: Internal Medicine

## 2017-09-02 VITALS — BP 141/83 | HR 69 | Temp 97.6°F | Ht 62.0 in | Wt 233.0 lb

## 2017-09-02 DIAGNOSIS — N3090 Cystitis, unspecified without hematuria: Secondary | ICD-10-CM

## 2017-09-02 DIAGNOSIS — N390 Urinary tract infection, site not specified: Secondary | ICD-10-CM | POA: Diagnosis not present

## 2017-09-02 NOTE — Progress Notes (Signed)
[facility] for Infectious Disease      Reason for Consult: recurrent UTIs    Referring Physician: Dr. Junious Silk    Patient ID: Amber Gutierrez, female    DOB: Sep 10, 1951, 66 y.o.   MRN: [id number]  HPI:   She gives a history of urinary frequency, dysuria and 3-4 times has had high fever associated with worsening symptoms and releived with antibiotics.  She has been colonized with Pseudomonas and has minimal relief of constant symptoms except with a little relief with Uribel.  When she gets sick, she gets fever up to 102.  No other associated symptoms.  In review of the record, she did have cystoscopy by Dr. Junious Silk 6/22 including bladder irrigation, biopsy and cell scrapping.  She is hopeful for some relief and asking about options.   CT report done by Dr. Junious Silk reviewed and some renal stones noted but not felt signficant per note.  None noted during cystoscopy.   Past Medical History:  Diagnosis Date  . Allergic rhinitis   . Bipolar 2 disorder ([place])   . Bladder neoplasm   . Chronic cystitis   . DJD (degenerative joint disease) of lumbar spine    has seen Dr. Joya Salm  . History of basal cell carcinoma excision    2014  --forehead  . History of nephrolithiasis    lithotrypsy calcium stone  . History of radiation therapy    thyroid polyps  1992  . History of recurrent UTIs   . Other postablative hypothyroidism    HX  RAI X2    Prior to Admission medications   Medication Sig Start Date End Date Taking? Authorizing Provider  beta carotene w/minerals (OCUVITE) tablet Take 1 tablet by mouth daily.    [provider]  buPROPion (WELLBUTRIN XL) 300 MG 24 hr tablet Take 300 mg by mouth every morning.     [provider]  cefpodoxime (VANTIN) 100 MG tablet Take 1 tablet (100 mg total) by mouth at bedtime. 06/20/17   Festus Aloe, MD  LamoTRIgine (LAMICTAL XR) 300 MG TB24 Take 1 tablet by mouth every morning.    [provider]  levothyroxine  (SYNTHROID, LEVOTHROID) 112 MCG tablet Take 1 tablet (112 mcg total) by mouth daily with breakfast. 09/12/16   Lucille Passy, MD  Meth-Hyo-M Barnett Hatter Phos-Ph Sal (URIBEL PO) Take by mouth.    [provider]  Multiple Vitamin (MULTIVITAMIN WITH MINERALS) TABS Take 2 tablets by mouth daily.    [provider]  naproxen sodium (ANAPROX) 220 MG tablet Take 220 mg by mouth daily as needed. For pain    [provider]  Probiotic Product (PROBIOTIC PO) Take 1 tablet by mouth at bedtime.     [provider]  solifenacin (VESICARE) 10 MG tablet Take by mouth daily.    [provider]    Allergies  Allergen Reactions  . Pravastatin Sodium Other (See Comments)     Muscle pain and weakness  . Sulfa Antibiotics Hives    Social History  Substance Use Topics  . Smoking status: Former Smoker    Packs/day: 0.50    Years: 45.00    Types: Cigarettes    Quit date: 06/29/2012  . Smokeless tobacco: Never Used  . Alcohol use Yes     Comment: socially    Family History  Problem Relation Age of Onset  . Heart attack Father        15  deceased  . Alcohol abuse Father   .  Breast cancer Sister   . Gallbladder disease Sister   . Bipolar disorder Mother   . Cancer Maternal Grandmother        colon    Review of Systems  Constitutional: negative for fevers and chills Gastrointestinal: negative for diarrhea Genitourinary: positive for frequency, dysuria, nocturia, urinary incontinence and constant discomfort All other systems reviewed and are negative    Constitutional: in no apparent distress    Labs: Lab Results  Component Value Date   WBC 6.5 09/12/2016   HGB 14.6 06/20/2017   HCT 44.7 09/12/2016   MCV 88.0 09/12/2016   PLT 216.0 09/12/2016    Lab Results  Component Value Date   CREATININE 0.84 09/12/2016   BUN 27 (H) 09/12/2016   NA 140 09/12/2016   K 4.6 09/12/2016   CL 106 09/12/2016   CO2 31 09/12/2016    Lab Results  Component Value  Date   ALT 15 09/12/2016   AST 16 09/12/2016   ALKPHOS 105 09/12/2016   BILITOT 0.4 09/12/2016     Assessment: Cystitis with recurrent UTIs.  I discussed the limited options for treatment and suppressive antibiotics unhelpful and long-term treatment for eradication ineffective in reducing chance of infections or of reducing symptoms.  I reviewed with her the recent culture from Dr. Milford Cage and developing some resistance to cipro.  I discussed the concern with further resistance each time it is treated.    Plan: 1) no indication for suppressive antibiotics 2) call when she gets a high fever (101 or higher) with no other associated symptoms - she may need IV therapy at that time depending on sensitivities.  She can initially try cipro but would limit duration to 3 days (if improving) 3) will see if Uribel PA can get it for her off formulary  45 minutes spent with 25 minutes face to face counseling Otherwise no return visit scheduled

## 2017-09-03 ENCOUNTER — Other Ambulatory Visit: Payer: Self-pay | Admitting: Family Medicine

## 2017-09-17 DIAGNOSIS — R3915 Urgency of urination: Secondary | ICD-10-CM | POA: Diagnosis not present

## 2017-09-17 DIAGNOSIS — N3020 Other chronic cystitis without hematuria: Secondary | ICD-10-CM | POA: Diagnosis not present

## 2017-10-09 ENCOUNTER — Other Ambulatory Visit: Payer: Self-pay | Admitting: Family Medicine

## 2017-10-09 DIAGNOSIS — E890 Postprocedural hypothyroidism: Secondary | ICD-10-CM

## 2017-10-09 DIAGNOSIS — Z01419 Encounter for gynecological examination (general) (routine) without abnormal findings: Secondary | ICD-10-CM | POA: Insufficient documentation

## 2017-10-09 DIAGNOSIS — E785 Hyperlipidemia, unspecified: Secondary | ICD-10-CM

## 2017-10-14 ENCOUNTER — Other Ambulatory Visit: Payer: PPO

## 2017-10-21 ENCOUNTER — Encounter: Payer: PPO | Admitting: Family Medicine

## 2017-10-28 ENCOUNTER — Encounter: Payer: PPO | Admitting: Family Medicine

## 2017-11-04 DIAGNOSIS — R102 Pelvic and perineal pain: Secondary | ICD-10-CM | POA: Diagnosis not present

## 2017-11-04 DIAGNOSIS — R319 Hematuria, unspecified: Secondary | ICD-10-CM | POA: Diagnosis not present

## 2017-11-04 DIAGNOSIS — R350 Frequency of micturition: Secondary | ICD-10-CM | POA: Diagnosis not present

## 2017-11-04 DIAGNOSIS — R3915 Urgency of urination: Secondary | ICD-10-CM | POA: Diagnosis not present

## 2017-12-11 DIAGNOSIS — N3010 Interstitial cystitis (chronic) without hematuria: Secondary | ICD-10-CM | POA: Diagnosis not present

## 2018-03-31 DIAGNOSIS — N3010 Interstitial cystitis (chronic) without hematuria: Secondary | ICD-10-CM | POA: Diagnosis not present

## 2018-05-07 DIAGNOSIS — R102 Pelvic and perineal pain: Secondary | ICD-10-CM | POA: Diagnosis not present

## 2018-05-07 DIAGNOSIS — B965 Pseudomonas (aeruginosa) (mallei) (pseudomallei) as the cause of diseases classified elsewhere: Secondary | ICD-10-CM | POA: Diagnosis not present

## 2018-05-07 DIAGNOSIS — G8929 Other chronic pain: Secondary | ICD-10-CM | POA: Diagnosis not present

## 2018-05-07 DIAGNOSIS — N390 Urinary tract infection, site not specified: Secondary | ICD-10-CM | POA: Diagnosis not present

## 2018-05-07 DIAGNOSIS — N3010 Interstitial cystitis (chronic) without hematuria: Secondary | ICD-10-CM | POA: Diagnosis not present

## 2018-06-03 DIAGNOSIS — N390 Urinary tract infection, site not specified: Secondary | ICD-10-CM | POA: Diagnosis not present

## 2018-06-03 DIAGNOSIS — N3090 Cystitis, unspecified without hematuria: Secondary | ICD-10-CM | POA: Diagnosis not present

## 2018-06-04 DIAGNOSIS — N3000 Acute cystitis without hematuria: Secondary | ICD-10-CM | POA: Diagnosis not present

## 2018-06-11 ENCOUNTER — Encounter: Payer: Self-pay | Admitting: Family Medicine

## 2018-07-06 ENCOUNTER — Encounter: Payer: Self-pay | Admitting: Family Medicine

## 2018-07-06 ENCOUNTER — Telehealth: Payer: Self-pay

## 2018-07-06 NOTE — Telephone Encounter (Signed)
Copied from [facility] [id number]. Topic: General - Other >> Jul 06, 2018  3:02 PM Marin Olp L wrote: Reason for CRM: Patient needs a pic line ordered before her surgery on 07/19. She sent two mychart messages, one on 06/13 and the other today, and still has not received a reply. Her urologist faxed your office twice since 06/13 with instructions for why the patient needs the pic line and what to do. Please call her back to advise.

## 2018-07-07 ENCOUNTER — Other Ambulatory Visit: Payer: Self-pay

## 2018-07-08 DIAGNOSIS — Z792 Long term (current) use of antibiotics: Secondary | ICD-10-CM | POA: Diagnosis not present

## 2018-07-08 DIAGNOSIS — B999 Unspecified infectious disease: Secondary | ICD-10-CM | POA: Diagnosis not present

## 2018-07-08 DIAGNOSIS — Z452 Encounter for adjustment and management of vascular access device: Secondary | ICD-10-CM | POA: Diagnosis not present

## 2018-07-08 NOTE — Telephone Encounter (Signed)
Pt is at hospital getting picc line placed now/thx dmf

## 2018-07-09 DIAGNOSIS — Z5181 Encounter for therapeutic drug level monitoring: Secondary | ICD-10-CM | POA: Diagnosis not present

## 2018-07-09 DIAGNOSIS — N3010 Interstitial cystitis (chronic) without hematuria: Secondary | ICD-10-CM | POA: Diagnosis not present

## 2018-07-09 DIAGNOSIS — R102 Pelvic and perineal pain: Secondary | ICD-10-CM | POA: Diagnosis not present

## 2018-07-09 DIAGNOSIS — N3941 Urge incontinence: Secondary | ICD-10-CM | POA: Diagnosis not present

## 2018-07-09 DIAGNOSIS — F329 Major depressive disorder, single episode, unspecified: Secondary | ICD-10-CM | POA: Diagnosis not present

## 2018-07-09 DIAGNOSIS — Z791 Long term (current) use of non-steroidal anti-inflammatories (NSAID): Secondary | ICD-10-CM | POA: Diagnosis not present

## 2018-07-09 DIAGNOSIS — B965 Pseudomonas (aeruginosa) (mallei) (pseudomallei) as the cause of diseases classified elsewhere: Secondary | ICD-10-CM | POA: Diagnosis not present

## 2018-07-09 DIAGNOSIS — Z87891 Personal history of nicotine dependence: Secondary | ICD-10-CM | POA: Diagnosis not present

## 2018-07-09 DIAGNOSIS — Z452 Encounter for adjustment and management of vascular access device: Secondary | ICD-10-CM | POA: Diagnosis not present

## 2018-07-09 DIAGNOSIS — G8929 Other chronic pain: Secondary | ICD-10-CM | POA: Diagnosis not present

## 2018-07-09 DIAGNOSIS — Z6841 Body Mass Index (BMI) 40.0 and over, adult: Secondary | ICD-10-CM | POA: Diagnosis not present

## 2018-07-09 DIAGNOSIS — F419 Anxiety disorder, unspecified: Secondary | ICD-10-CM | POA: Diagnosis not present

## 2018-07-17 DIAGNOSIS — N3010 Interstitial cystitis (chronic) without hematuria: Secondary | ICD-10-CM | POA: Diagnosis not present

## 2018-07-17 DIAGNOSIS — N390 Urinary tract infection, site not specified: Secondary | ICD-10-CM | POA: Diagnosis not present

## 2018-07-17 DIAGNOSIS — R3915 Urgency of urination: Secondary | ICD-10-CM | POA: Diagnosis not present

## 2018-07-17 DIAGNOSIS — M7918 Myalgia, other site: Secondary | ICD-10-CM | POA: Diagnosis not present

## 2018-07-17 DIAGNOSIS — N3090 Cystitis, unspecified without hematuria: Secondary | ICD-10-CM | POA: Diagnosis not present

## 2018-07-17 DIAGNOSIS — R350 Frequency of micturition: Secondary | ICD-10-CM | POA: Diagnosis not present

## 2018-07-19 ENCOUNTER — Inpatient Hospital Stay (HOSPITAL_COMMUNITY)
Admission: EM | Admit: 2018-07-19 | Discharge: 2018-07-20 | DRG: 872 | Disposition: A | Discharge status: Home / Self Care | Payer: PPO | Attending: Internal Medicine | Admitting: Internal Medicine

## 2018-07-19 ENCOUNTER — Encounter (HOSPITAL_COMMUNITY): Payer: Self-pay

## 2018-07-19 ENCOUNTER — Other Ambulatory Visit: Payer: Self-pay

## 2018-07-19 ENCOUNTER — Emergency Department (HOSPITAL_COMMUNITY): Payer: PPO

## 2018-07-19 ENCOUNTER — Inpatient Hospital Stay (HOSPITAL_COMMUNITY): Payer: PPO

## 2018-07-19 DIAGNOSIS — Z85828 Personal history of other malignant neoplasm of skin: Secondary | ICD-10-CM

## 2018-07-19 DIAGNOSIS — R739 Hyperglycemia, unspecified: Secondary | ICD-10-CM

## 2018-07-19 DIAGNOSIS — Z923 Personal history of irradiation: Secondary | ICD-10-CM | POA: Diagnosis not present

## 2018-07-19 DIAGNOSIS — N200 Calculus of kidney: Secondary | ICD-10-CM | POA: Diagnosis not present

## 2018-07-19 DIAGNOSIS — A4152 Sepsis due to Pseudomonas: Principal | ICD-10-CM | POA: Diagnosis present

## 2018-07-19 DIAGNOSIS — E890 Postprocedural hypothyroidism: Secondary | ICD-10-CM | POA: Diagnosis not present

## 2018-07-19 DIAGNOSIS — N12 Tubulo-interstitial nephritis, not specified as acute or chronic: Secondary | ICD-10-CM | POA: Diagnosis not present

## 2018-07-19 DIAGNOSIS — D72829 Elevated white blood cell count, unspecified: Secondary | ICD-10-CM | POA: Diagnosis not present

## 2018-07-19 DIAGNOSIS — A4150 Gram-negative sepsis, unspecified: Secondary | ICD-10-CM

## 2018-07-19 DIAGNOSIS — F319 Bipolar disorder, unspecified: Secondary | ICD-10-CM | POA: Diagnosis not present

## 2018-07-19 DIAGNOSIS — N3010 Interstitial cystitis (chronic) without hematuria: Secondary | ICD-10-CM | POA: Diagnosis present

## 2018-07-19 DIAGNOSIS — R000 Tachycardia, unspecified: Secondary | ICD-10-CM | POA: Diagnosis not present

## 2018-07-19 DIAGNOSIS — A419 Sepsis, unspecified organism: Secondary | ICD-10-CM

## 2018-07-19 DIAGNOSIS — Z87440 Personal history of urinary (tract) infections: Secondary | ICD-10-CM | POA: Diagnosis not present

## 2018-07-19 DIAGNOSIS — N3090 Cystitis, unspecified without hematuria: Secondary | ICD-10-CM | POA: Diagnosis not present

## 2018-07-19 DIAGNOSIS — E039 Hypothyroidism, unspecified: Secondary | ICD-10-CM | POA: Diagnosis present

## 2018-07-19 DIAGNOSIS — Z6841 Body Mass Index (BMI) 40.0 and over, adult: Secondary | ICD-10-CM | POA: Diagnosis not present

## 2018-07-19 DIAGNOSIS — Z87891 Personal history of nicotine dependence: Secondary | ICD-10-CM

## 2018-07-19 DIAGNOSIS — Z87442 Personal history of urinary calculi: Secondary | ICD-10-CM | POA: Diagnosis present

## 2018-07-19 DIAGNOSIS — E032 Hypothyroidism due to medicaments and other exogenous substances: Secondary | ICD-10-CM | POA: Diagnosis not present

## 2018-07-19 DIAGNOSIS — N390 Urinary tract infection, site not specified: Secondary | ICD-10-CM | POA: Diagnosis not present

## 2018-07-19 DIAGNOSIS — Z79899 Other long term (current) drug therapy: Secondary | ICD-10-CM | POA: Diagnosis not present

## 2018-07-19 DIAGNOSIS — F3181 Bipolar II disorder: Secondary | ICD-10-CM | POA: Diagnosis not present

## 2018-07-19 LAB — CBC WITH DIFFERENTIAL/PLATELET
BASOS PCT: 0 %
Basophils Absolute: 0 10*3/uL (ref 0.0–0.1)
EOS ABS: 0.1 10*3/uL (ref 0.0–0.7)
Eosinophils Relative: 1 %
HEMATOCRIT: 42.7 % (ref 36.0–46.0)
Hemoglobin: 14.7 g/dL (ref 12.0–15.0)
LYMPHS ABS: 0.7 10*3/uL (ref 0.7–4.0)
Lymphocytes Relative: 6 %
MCH: 30.1 pg (ref 26.0–34.0)
MCHC: 34.4 g/dL (ref 30.0–36.0)
MCV: 87.5 fL (ref 78.0–100.0)
MONO ABS: 0.7 10*3/uL (ref 0.1–1.0)
MONOS PCT: 7 %
Neutro Abs: 9.6 10*3/uL — ABNORMAL HIGH (ref 1.7–7.7)
Neutrophils Relative %: 86 %
Platelets: 186 10*3/uL (ref 150–400)
RBC: 4.88 MIL/uL (ref 3.87–5.11)
RDW: 13.6 % (ref 11.5–15.5)
WBC: 11.1 10*3/uL — ABNORMAL HIGH (ref 4.0–10.5)

## 2018-07-19 LAB — COMPREHENSIVE METABOLIC PANEL
ALBUMIN: 3.9 g/dL (ref 3.5–5.0)
ALT: 18 U/L (ref 0–44)
ANION GAP: 10 (ref 5–15)
AST: 15 U/L (ref 15–41)
Alkaline Phosphatase: 88 U/L (ref 38–126)
BUN: 15 mg/dL (ref 8–23)
CHLORIDE: 103 mmol/L (ref 98–111)
CO2: 25 mmol/L (ref 22–32)
Calcium: 9 mg/dL (ref 8.9–10.3)
Creatinine, Ser: 0.71 mg/dL (ref 0.44–1.00)
GFR calc Af Amer: >60 mL/min (ref >60)
GFR calc non Af Amer: >60 mL/min (ref >60)
GLUCOSE: 123 mg/dL — AB (ref 70–99)
POTASSIUM: 3.7 mmol/L (ref 3.5–5.1)
SODIUM: 138 mmol/L (ref 135–145)
TOTAL PROTEIN: 7.3 g/dL (ref 6.5–8.1)
Total Bilirubin: 1.1 mg/dL (ref 0.3–1.2)

## 2018-07-19 LAB — URINALYSIS, ROUTINE W REFLEX MICROSCOPIC
Bilirubin Urine: NEGATIVE
Glucose, UA: NEGATIVE mg/dL
Ketones, ur: NEGATIVE mg/dL
Nitrite: POSITIVE — AB
PH: 7 (ref 5.0–8.0)
Protein, ur: 30 mg/dL — AB
Specific Gravity, Urine: 1.013 (ref 1.005–1.030)
WBC, UA: >50 WBC/hpf — ABNORMAL HIGH (ref 0–5)

## 2018-07-19 LAB — MAGNESIUM: Magnesium: 1.6 mg/dL — ABNORMAL LOW (ref 1.7–2.4)

## 2018-07-19 LAB — PROTIME-INR
INR: 0.99
PROTHROMBIN TIME: 13 s (ref 11.4–15.2)

## 2018-07-19 LAB — PHOSPHORUS: Phosphorus: 2.5 mg/dL (ref 2.5–4.6)

## 2018-07-19 LAB — LACTIC ACID, PLASMA
LACTIC ACID, VENOUS: 0.8 mmol/L (ref 0.5–1.9)
LACTIC ACID, VENOUS: 1.7 mmol/L (ref 0.5–1.9)

## 2018-07-19 MED ORDER — FESOTERODINE FUMARATE ER 8 MG PO TB24
8.0000 mg | ORAL_TABLET | Freq: Every day | ORAL | Status: DC
  Administered 2018-07-19 – 2018-07-20 (×2): 8 mg via ORAL
  Filled 2018-07-19 (×2): qty 1

## 2018-07-19 MED ORDER — BUPROPION HCL ER (XL) 300 MG PO TB24
300.0000 mg | ORAL_TABLET | Freq: Every morning | ORAL | Status: DC
  Administered 2018-07-19 – 2018-07-20 (×2): 300 mg via ORAL
  Filled 2018-07-19 (×2): qty 1

## 2018-07-19 MED ORDER — ACETAMINOPHEN 325 MG PO TABS
650.0000 mg | ORAL_TABLET | Freq: Once | ORAL | Status: AC
  Administered 2018-07-19: 650 mg via ORAL
  Filled 2018-07-19: qty 2

## 2018-07-19 MED ORDER — MORPHINE SULFATE (PF) 4 MG/ML IV SOLN
4.0000 mg | Freq: Once | INTRAVENOUS | Status: AC
  Administered 2018-07-19: 4 mg via INTRAVENOUS
  Filled 2018-07-19: qty 1

## 2018-07-19 MED ORDER — ACETAMINOPHEN 650 MG RE SUPP: 650.0000 mg | Freq: Four times a day (QID) | RECTAL | Status: DC | PRN

## 2018-07-19 MED ORDER — SODIUM CHLORIDE 0.9 % IV SOLN
INTRAVENOUS | Status: DC
  Administered 2018-07-19 – 2018-07-20 (×2): via INTRAVENOUS

## 2018-07-19 MED ORDER — AMITRIPTYLINE HCL 25 MG PO TABS
50.0000 mg | ORAL_TABLET | Freq: Every day | ORAL | Status: DC
  Administered 2018-07-19: 50 mg via ORAL
  Filled 2018-07-19: qty 2

## 2018-07-19 MED ORDER — SODIUM CHLORIDE 0.9 % IV SOLN
1000.0000 mL | INTRAVENOUS | Status: DC
  Administered 2018-07-19: 1000 mL via INTRAVENOUS

## 2018-07-19 MED ORDER — SODIUM CHLORIDE 0.9 % IV SOLN
2.0000 g | Freq: Three times a day (TID) | INTRAVENOUS | Status: DC
  Administered 2018-07-19 – 2018-07-20 (×2): 2 g via INTRAVENOUS
  Filled 2018-07-19 (×4): qty 2

## 2018-07-19 MED ORDER — ONDANSETRON HCL 4 MG PO TABS: 4.0000 mg | ORAL_TABLET | Freq: Four times a day (QID) | ORAL | Status: DC | PRN

## 2018-07-19 MED ORDER — KETOROLAC TROMETHAMINE 30 MG/ML IJ SOLN
30.0000 mg | Freq: Once | INTRAMUSCULAR | Status: AC
  Administered 2018-07-19: 30 mg via INTRAVENOUS
  Filled 2018-07-19: qty 1

## 2018-07-19 MED ORDER — DARIFENACIN HYDROBROMIDE ER 15 MG PO TB24
15.0000 mg | ORAL_TABLET | Freq: Every day | ORAL | Status: DC
  Administered 2018-07-19 – 2018-07-20 (×2): 15 mg via ORAL
  Filled 2018-07-19 (×2): qty 1

## 2018-07-19 MED ORDER — SODIUM CHLORIDE 0.9 % IV BOLUS
1000.0000 mL | Freq: Once | INTRAVENOUS | Status: AC
  Administered 2018-07-19: 1000 mL via INTRAVENOUS

## 2018-07-19 MED ORDER — ACETAMINOPHEN 325 MG PO TABS
650.0000 mg | ORAL_TABLET | Freq: Four times a day (QID) | ORAL | Status: DC | PRN
  Administered 2018-07-19 – 2018-07-20 (×3): 650 mg via ORAL
  Filled 2018-07-19 (×3): qty 2

## 2018-07-19 MED ORDER — DIPHENHYDRAMINE HCL 50 MG/ML IJ SOLN
25.0000 mg | Freq: Once | INTRAMUSCULAR | Status: AC
  Administered 2018-07-19: 25 mg via INTRAVENOUS
  Filled 2018-07-19: qty 1

## 2018-07-19 MED ORDER — PENTOSAN POLYSULFATE SODIUM 100 MG PO CAPS
200.0000 mg | ORAL_CAPSULE | Freq: Two times a day (BID) | ORAL | Status: DC
  Administered 2018-07-19 – 2018-07-20 (×2): 200 mg via ORAL
  Filled 2018-07-19 (×2): qty 2

## 2018-07-19 MED ORDER — DIPHENHYDRAMINE HCL 50 MG/ML IJ SOLN
12.5000 mg | Freq: Once | INTRAMUSCULAR | Status: AC
  Administered 2018-07-19: 12.5 mg via INTRAVENOUS
  Filled 2018-07-19: qty 1

## 2018-07-19 MED ORDER — SODIUM CHLORIDE 0.9 % IV SOLN
2.0000 g | Freq: Once | INTRAVENOUS | Status: AC
  Administered 2018-07-19: 2 g via INTRAVENOUS
  Filled 2018-07-19: qty 2

## 2018-07-19 MED ORDER — LEVOTHYROXINE SODIUM 112 MCG PO TABS
112.0000 ug | ORAL_TABLET | Freq: Every day | ORAL | Status: DC
  Administered 2018-07-20: 112 ug via ORAL
  Filled 2018-07-19: qty 1

## 2018-07-19 MED ORDER — ONDANSETRON HCL 4 MG/2ML IJ SOLN
4.0000 mg | Freq: Once | INTRAMUSCULAR | Status: AC
  Administered 2018-07-19: 4 mg via INTRAVENOUS
  Filled 2018-07-19: qty 2

## 2018-07-19 MED ORDER — ONDANSETRON HCL 4 MG/2ML IJ SOLN: 4.0000 mg | Freq: Four times a day (QID) | INTRAMUSCULAR | Status: DC | PRN

## 2018-07-19 MED ORDER — HYDROCODONE-ACETAMINOPHEN 5-325 MG PO TABS
1.0000 | ORAL_TABLET | Freq: Two times a day (BID) | ORAL | Status: DC | PRN
  Administered 2018-07-19: 1 via ORAL
  Filled 2018-07-19: qty 1

## 2018-07-19 MED ORDER — PROCHLORPERAZINE EDISYLATE 10 MG/2ML IJ SOLN
10.0000 mg | Freq: Once | INTRAMUSCULAR | Status: AC
  Administered 2018-07-19: 10 mg via INTRAVENOUS
  Filled 2018-07-19: qty 2

## 2018-07-19 MED ORDER — CEFTAZIDIME 1 G IJ SOLR: 1.0000 g | Freq: Once | INTRAMUSCULAR | Status: DC

## 2018-07-19 MED ORDER — PHENAZOPYRIDINE HCL 200 MG PO TABS
200.0000 mg | ORAL_TABLET | Freq: Three times a day (TID) | ORAL | Status: DC
  Administered 2018-07-19 – 2018-07-20 (×3): 200 mg via ORAL
  Filled 2018-07-19 (×4): qty 1

## 2018-07-19 MED ORDER — LEVOTHYROXINE SODIUM 112 MCG PO TABS: 112.0000 ug | ORAL_TABLET | Freq: Every day | ORAL | Status: DC

## 2018-07-19 MED ORDER — HEPARIN SODIUM (PORCINE) 5000 UNIT/ML IJ SOLN
5000.0000 [IU] | Freq: Three times a day (TID) | INTRAMUSCULAR | Status: DC
  Administered 2018-07-19 – 2018-07-20 (×2): 5000 [IU] via SUBCUTANEOUS
  Filled 2018-07-19 (×2): qty 1

## 2018-07-19 MED ORDER — ADULT MULTIVITAMIN W/MINERALS CH
2.0000 | ORAL_TABLET | Freq: Every day | ORAL | Status: DC
  Administered 2018-07-19 – 2018-07-20 (×2): 2 via ORAL
  Filled 2018-07-19 (×2): qty 2

## 2018-07-19 MED ORDER — SENNOSIDES-DOCUSATE SODIUM 8.6-50 MG PO TABS: 1.0000 | ORAL_TABLET | Freq: Every evening | ORAL | Status: DC | PRN

## 2018-07-19 MED ORDER — METOCLOPRAMIDE HCL 5 MG/ML IJ SOLN
10.0000 mg | Freq: Once | INTRAMUSCULAR | Status: AC
  Administered 2018-07-19: 10 mg via INTRAVENOUS
  Filled 2018-07-19: qty 2

## 2018-07-19 NOTE — ED Triage Notes (Addendum)
Pt had urinary catheter placed two days ago for urinary infection. Pt c/o fever, 102 and greater at home.Pt states nausea and vomiting this morning, has not taken any OTC meds for fever. Pt c/o bilateral flank pain and abdominal / pelvis pain 8/10. Pt states PICC line removed 2 days ago.

## 2018-07-19 NOTE — Progress Notes (Signed)
Pharmacy Antibiotic Note  Amber Gutierrez is a 67 y.o. female with interstitial cystitis with chronic pseudomonal infection admitted on 07/19/2018 with sepsis.  Patient had been on ceftazidime by PICC for past 10 days that finished on Friday 7/19, PICC removed. Underwent cystoscopy at [facility], foley placed and urine culture obtained. Per EDP report, urine culture from [facility] on 7/19 is growing GNR. Pharmacy has been consulted for ceftazidime dosing.  Plan: Ceftazidime 2g IV q8h Follow up renal function, cultures  Height: 5\' 2"  (157.5 cm) Weight: 224 lb (101.6 kg) IBW/kg (Calculated) : 50.1  Temp (24hrs), Avg:101 F (38.3 C), Min:99.6 F (37.6 C), Max:102.3 F (39.1 C)  Recent Labs  Lab 07/19/18 1050 07/19/18 1051 07/19/18 1130  WBC 11.1*  --   --   CREATININE 0.71  --   --   LATICACIDVEN  --  0.8 1.7    Estimated Creatinine Clearance: 75.1 mL/min (by C-G formula based on SCr of 0.71 mg/dL).    Allergies  Allergen Reactions  . Pravastatin Sodium Other (See Comments)     Muscle pain and weakness  . Sulfa Antibiotics Hives  . Tramadol Other (See Comments)    dizziness    Antimicrobials this admission:  7/21 Ceftaz >>  Dose adjustments this admission:    Microbiology results:  7/21 BCx: 7/21 UCx:  Thank you for allowing pharmacy to be a part of this patient's care.  Peggyann Juba, PharmD, BCPS Pager: [phone] 07/19/2018 1:43 PM

## 2018-07-19 NOTE — ED Notes (Signed)
ED TO INPATIENT HANDOFF REPORT  Name/Age/Gender Amber Gutierrez 67 y.o. female  Code Status   Home/SNF/Other Home  Chief Complaint fever  Level of Care/Admitting Diagnosis ED Disposition    ED Disposition Condition [facility] Area: [place] [[id number]]  Level of Care: Med-Surg [16]  Diagnosis: Complicated UTI (urinary tract infection) [432761]  Admitting Physician: Gervais, [place] [[id number]]  Attending Physician: Raiford Noble LATIF [id number]  Estimated length of stay: past midnight tomorrow  Certification:: I certify this patient will need inpatient services for at least 2 midnights  PT Class (Do Not Modify): Inpatient [101]  PT Acc Code (Do Not Modify): Private [1]       Medical History Past Medical History:  Diagnosis Date  . Allergic rhinitis   . Bipolar 2 disorder ([place] [place])   . Bladder neoplasm   . Chronic cystitis   . DJD (degenerative joint disease) of lumbar spine    has seen Dr. Joya Salm  . History of basal cell carcinoma excision    2014  --forehead  . History of nephrolithiasis    lithotrypsy calcium stone  . History of radiation therapy    thyroid polyps  1992  . History of recurrent UTIs   . Other postablative hypothyroidism    HX  RAI X2    Allergies Allergies  Allergen Reactions  . Pravastatin Sodium Other (See Comments)     Muscle pain and weakness  . Sulfa Antibiotics Hives  . Tramadol Other (See Comments)    dizziness    IV Location/Drains/Wounds Patient Lines/Drains/Airways Status   Active Line/Drains/Airways    Name:   Placement date:   Placement time:   Site:   Days:   Peripheral IV 07/19/18 Right Wrist   07/19/18    1053    Wrist   less than 1   Airway   06/20/17    1026     394   Incision 08/20/12 Abdomen Other (Comment)   08/20/12    1216     2159   Incision 08/20/12 Back Other (Comment)   08/20/12    1216     2159   Incision 08/27/13 Perineum Other (Comment)   08/27/13    0659     1787    Incision (Closed) 06/20/17 Vagina   06/20/17    1040     394   Incision - 4 Ports Abdomen 1: Umbilicus 2: Mid;Upper 3: Right;Lateral 4: Right;Medial   08/20/12    -     2159          Labs/Imaging Results for orders placed or performed during the hospital encounter of 07/19/18 (from the past 48 hour(s))  Comprehensive metabolic panel     Status: Abnormal   Collection Time: 07/19/18 10:50 AM  Result Value Ref Range   Sodium 138 135 - 145 mmol/L   Potassium 3.7 3.5 - 5.1 mmol/L   Chloride 103 98 - 111 mmol/L    Comment: Please note change in reference range.   CO2 25 22 - 32 mmol/L   Glucose, Bld 123 (H) 70 - 99 mg/dL    Comment: Please note change in reference range.   BUN 15 8 - 23 mg/dL    Comment: Please note change in reference range.   Creatinine, Ser 0.71 0.44 - 1.00 mg/dL   Calcium 9.0 8.9 - 10.3 mg/dL   Total Protein 7.3 6.5 - 8.1 g/dL   Albumin 3.9 3.5 - 5.0 g/dL  AST 15 15 - 41 U/L   ALT 18 0 - 44 U/L    Comment: Please note change in reference range.   Alkaline Phosphatase 88 38 - 126 U/L   Total Bilirubin 1.1 0.3 - 1.2 mg/dL   GFR calc non Af Amer >60 >60 mL/min   GFR calc Af Amer >60 >60 mL/min    Comment: (NOTE) The eGFR has been calculated using the CKD EPI equation. This calculation has not been validated in all clinical situations. eGFR's persistently <60 mL/min signify possible Chronic Kidney Disease.    Anion gap 10 5 - 15    Comment: Performed at [facility], [place] [address] [place], [place] [address]  CBC with Differential     Status: Abnormal   Collection Time: 07/19/18 10:50 AM  Result Value Ref Range   WBC 11.1 (H) 4.0 - 10.5 K/uL   RBC 4.88 3.87 - 5.11 MIL/uL   Hemoglobin 14.7 12.0 - 15.0 g/dL   HCT 42.7 36.0 - 46.0 %   MCV 87.5 78.0 - 100.0 fL   MCH 30.1 26.0 - 34.0 pg   MCHC 34.4 30.0 - 36.0 g/dL   RDW 13.6 11.5 - 15.5 %   Platelets 186 150 - 400 K/uL   Neutrophils Relative % 86 %   Neutro Abs 9.6 (H) 1.7 - 7.7 K/uL    Lymphocytes Relative 6 %   Lymphs Abs 0.7 0.7 - 4.0 K/uL   Monocytes Relative 7 %   Monocytes Absolute 0.7 0.1 - 1.0 K/uL   Eosinophils Relative 1 %   Eosinophils Absolute 0.1 0.0 - 0.7 K/uL   Basophils Relative 0 %   Basophils Absolute 0.0 0.0 - 0.1 K/uL    Comment: Performed at [facility], [place] [address], [place], [place] [address]  Protime-INR     Status: None   Collection Time: 07/19/18 10:50 AM  Result Value Ref Range   Prothrombin Time 13.0 11.4 - 15.2 seconds   INR 0.99     Comment: Performed at [facility], [place] [address], [place], [place] [address]  Urinalysis, Routine w reflex microscopic     Status: Abnormal   Collection Time: 07/19/18 10:51 AM  Result Value Ref Range   Color, Urine AMBER (A) YELLOW    Comment: BIOCHEMICALS MAY BE AFFECTED BY COLOR   APPearance CLEAR CLEAR   Specific Gravity, Urine 1.013 1.005 - 1.030   pH 7.0 5.0 - 8.0   Glucose, UA NEGATIVE NEGATIVE mg/dL   Hgb urine dipstick MODERATE (A) NEGATIVE   Bilirubin Urine NEGATIVE NEGATIVE   Ketones, ur NEGATIVE NEGATIVE mg/dL   Protein, ur 30 (A) NEGATIVE mg/dL   Nitrite POSITIVE (A) NEGATIVE   Leukocytes, UA LARGE (A) NEGATIVE   RBC / HPF >50 (H) 0 - 5 RBC/hpf   WBC, UA >50 (H) 0 - 5 WBC/hpf   Bacteria, UA RARE (A) NONE SEEN   Squamous Epithelial / LPF 0-5 0 - 5   Mucus PRESENT     Comment: Performed at [facility], [place] [address] [place], [place] [address]  Lactic acid, plasma     Status: None   Collection Time: 07/19/18 10:51 AM  Result Value Ref Range   Lactic Acid, Venous 0.8 0.5 - 1.9 mmol/L    Comment: Performed at [facility], [place] [address], [place], [place] [address]  Lactic acid, plasma     Status: None   Collection Time: 07/19/18 11:30 AM  Result Value Ref Range   Lactic Acid, Venous 1.7 0.5 - 1.9 mmol/L    Comment: Performed at [facility], [place] [address], [place], [place] [address]    No results found.  Pending Labs Unresulted Labs (From admission, onward)   Start     Ordered   07/20/18 0500  CBC with Differential/Platelet  Tomorrow morning,   R     07/19/18 1347   07/20/18 0500  Comprehensive metabolic panel  Tomorrow morning,   R     07/19/18 1347   07/20/18 0500  Magnesium  Tomorrow morning,   R     07/19/18 1347   07/20/18 0500  Phosphorus  Tomorrow morning,   R     07/19/18 1347   07/20/18 0500  TSH  Tomorrow morning,   R     07/19/18 1347   07/20/18 0500  Hemoglobin A1c  Tomorrow morning,   R     07/19/18 1347   07/19/18 1103  Urine culture  STAT,   STAT     07/19/18 1102   07/19/18 1026  Culture, blood (Routine x 2)  BLOOD CULTURE X 2,   STAT     07/19/18 1025   Signed and Held  HIV antibody (Routine Testing)  Once,   R     Signed and Held   Signed and Held  Magnesium  Add-on,   R     Signed and Held   Signed and Held  Phosphorus  Add-on,   R     Signed and Held   Signed and Held  Comprehensive metabolic panel  Tomorrow morning,   R     Signed and Held   Signed and Held  CBC with Differential/Platelet  Tomorrow morning,   R     Signed and Held   Visual merchandiser and Held  Magnesium  Tomorrow morning,   R     Signed and Held   Visual merchandiser and Held  Phosphorus  Tomorrow morning,   R     Signed and Held   Signed and Held  Creatinine, serum  (enoxaparin (LOVENOX)    CrCl >/= 30 ml/min)  Weekly,   R    Comments:  while on enoxaparin therapy    Signed and Held   Signed and Held  Procalcitonin - Baseline  STAT,   STAT     Signed and Held   Signed and Held  Procalcitonin  Daily,   R     Signed and Held      Vitals/Pain Today's Vitals   07/19/18 1215 07/19/18 1300 07/19/18 1345 07/19/18 1353  BP: (!) 165/95 (!) 155/95 (!) 157/93   Pulse:      Resp: (!) 23 (!) 30 (!) 24   Temp:      TempSrc:      SpO2: 92% 92% 92%   Weight:      Height:      PainSc:    5     Isolation Precautions No active isolations  Medications Medications  0.9 %  sodium  chloride infusion (0 mLs Intravenous Stopped 07/19/18 1225)  heparin injection 5,000 Units (has no administration in time range)  sodium chloride 0.9 % bolus 1,000 mL (1,000 mLs Intravenous New Bag/Given 07/19/18 1415)  cefTAZidime (FORTAZ) 2 g in sodium chloride 0.9 % 100 mL IVPB (has no administration in time range)  metoCLOPramide (REGLAN) injection 10 mg (has no administration in time range)  diphenhydrAMINE (BENADRYL) injection 12.5 mg (has no administration  in time range)  sodium chloride 0.9 % bolus 1,000 mL (0 mLs Intravenous Stopped 07/19/18 1157)  morphine 4 MG/ML injection 4 mg (4 mg Intravenous Given 07/19/18 1055)  ondansetron (ZOFRAN) injection 4 mg (4 mg Intravenous Given 07/19/18 1055)  cefTAZidime (FORTAZ) 2 g in sodium chloride 0.9 % 100 mL IVPB (0 g Intravenous Stopped 07/19/18 1348)  acetaminophen (TYLENOL) tablet 650 mg (650 mg Oral Given 07/19/18 1233)    Mobility walks

## 2018-07-19 NOTE — ED Provider Notes (Signed)
[facility] DEPT Provider Note   CSN: [id number] Arrival date & time: 07/19/18  1008     History   Chief Complaint Chief Complaint  Patient presents with  . Fever  . Dysuria    HPI Amber Gutierrez is a 67 y.o. female.  Patient is a 67 year old female with a complicated history of interstitial cystitis with chronic pseudomonal infection presenting today with a fever.  Patient reports that she was on ceftazidime by PICC line for the last 10 days and that was finished on Friday when she underwent a cystoscopy and [facility] by urology at [facility].  She had a catheter placed which has been present since Friday.  Her PICC line was removed and assessed to have a descending finished on Friday and she has not been on antibiotics since that time.  Patient states that overnight she developed a temperature of 103 and has had severe pain in her bilateral flank area as well as vomiting.  She also complains of some lower suprapubic pain but her catheter is been draining without difficulty.  She denies any cough, chest pain or shortness of breath.  She spoke with [facility] urology today and they stated she does have gram-negative rods growing from her urine culture that was done 2 days ago.  The history is provided by the patient.  Fever   This is a new problem. The current episode started 3 to 5 hours ago. The problem occurs constantly. The problem has not changed since onset.The maximum temperature noted was 103 to 104 F. The temperature was taken using an oral thermometer. Associated symptoms include vomiting and muscle aches. Pertinent negatives include no chest pain, no diarrhea, no congestion and no cough. Associated symptoms comments: Flank pain. She has tried nothing for the symptoms. The treatment provided no relief.  Dysuria   Associated symptoms include vomiting.    Past Medical History:  Diagnosis Date  . Allergic rhinitis   . Bipolar 2 disorder ([place])   .  Bladder neoplasm   . Chronic cystitis   . DJD (degenerative joint disease) of lumbar spine    has seen Dr. Joya Salm  . History of basal cell carcinoma excision    2014  --forehead  . History of nephrolithiasis    lithotrypsy calcium stone  . History of radiation therapy    thyroid polyps  1992  . History of recurrent UTIs   . Other postablative hypothyroidism    HX  RAI X2    Patient Active Problem List   Diagnosis Date Noted  . Well woman exam 10/09/2017  . Cystitis 09/02/2017  . History of recurrent UTIs 08/12/2017  . Bipolar depression ([place]) 07/12/2013  . Hypothyroidism 11/13/2010  . HLD (hyperlipidemia) 11/13/2010  . OBESITY 11/13/2010  . ADJUSTMENT DISORDER WITH DEPRESSED MOOD 11/13/2010  . Allergic rhinitis 11/13/2010  . Recurrent UTI 11/13/2010  . DEGENERATIVE JOINT DISEASE, BACK 11/13/2010  . NEPHROLITHIASIS, HX OF 11/13/2010    Past Surgical History:  Procedure Laterality Date  . [place]  . CHOLECYSTECTOMY  08/20/2012   Procedure: LAPAROSCOPIC CHOLECYSTECTOMY WITH INTRAOPERATIVE CHOLANGIOGRAM;  Surgeon: Gayland Curry, MD,FACS;  Location: [facility];  Service: General;  Laterality: N/A;  . CYSTOSCOPY WITH BIOPSY N/A 08/27/2013   Procedure: CYSTOSCOPY WITH BLADDER BIOPSY/FULGRATION;  Surgeon: Fredricka Bonine, MD;  Location: [facility];  Service: Urology;  Laterality: N/A;  . CYSTOSCOPY WITH BIOPSY N/A 06/20/2017   Procedure: CYSTOSCOPY WITH BIOPSY  AND FULGURATION;  Surgeon: Festus Aloe, MD;  Location: [facility];  Service: Urology;  Laterality: N/A;  . EXTRACORPOREAL SHOCK WAVE LITHOTRIPSY  2010  . MASS EXCISION  08/20/2012   Procedure: EXCISION MASS;  Surgeon: Gayland Curry, MD,FACS;  Location: [facility];  Service: General;  Laterality: N/A;  . TUBAL LIGATION  1976     OB History    Gravida  2   Para  2   Term      Preterm      AB      Living        SAB      TAB      Ectopic      Multiple        Live Births               Home Medications    Prior to Admission medications   Medication Sig Start Date End Date Taking? Authorizing Provider  beta carotene w/minerals (OCUVITE) tablet Take 1 tablet by mouth daily.    [provider]  buPROPion (WELLBUTRIN XL) 300 MG 24 hr tablet Take 300 mg by mouth every morning.     [provider]  LamoTRIgine (LAMICTAL XR) 300 MG TB24 Take 1 tablet by mouth every morning.    [provider]  levothyroxine (SYNTHROID, LEVOTHROID) 112 MCG tablet Take 1 tablet (112 mcg total) by mouth daily with breakfast. 09/12/16   Lucille Passy, MD  levothyroxine (SYNTHROID, LEVOTHROID) 112 MCG tablet TAKE ONE TABLET BY MOUTH EVERY MORNING WITH BREAKFAST 09/04/17   Lucille Passy, MD  Meth-Hyo-M Barnett Hatter Phos-Ph Sal (URIBEL PO) Take by mouth.    [provider]  Multiple Vitamin (MULTIVITAMIN WITH MINERALS) TABS Take 2 tablets by mouth daily.    [provider]  naproxen sodium (ANAPROX) 220 MG tablet Take 220 mg by mouth daily as needed. For pain    [provider]  solifenacin (VESICARE) 10 MG tablet Take by mouth daily.    [provider]    Family History Family History  Problem Relation Age of Onset  . Heart attack Father        48  deceased  . Alcohol abuse Father   . Breast cancer Sister   . Gallbladder disease Sister   . Bipolar disorder Mother   . Cancer Maternal Grandmother        colon    Social History Social History   Tobacco Use  . Smoking status: Former Smoker    Packs/day: 0.50    Years: 45.00    Pack years: 22.50    Types: Cigarettes    Last attempt to quit: 06/29/2012    Years since quitting: 6.0  . Smokeless tobacco: Never Used  Substance Use Topics  . Alcohol use: No  . Drug use: No     Allergies   Pravastatin sodium and Sulfa antibiotics   Review of Systems Review of Systems  Constitutional: Positive for fever.  HENT: Negative for congestion.    Respiratory: Negative for cough.   Cardiovascular: Negative for chest pain.  Gastrointestinal: Positive for vomiting. Negative for diarrhea.  Genitourinary: Positive for dysuria.  All other systems reviewed and are negative.    Physical Exam Updated Vital Signs BP (!) 151/114 (BP Location: Right Arm)   Pulse (!) 113   Temp (!) 102.3 F (39.1 C) (Oral)   Resp 16   Ht 5\' 2"  (1.575 m)   Wt 101.6 kg (224 lb)  SpO2 94%   BMI 40.97 kg/m   Physical Exam  Constitutional: She is oriented to person, place, and time. She appears well-developed and well-nourished. No distress.  HENT:  Head: Normocephalic and atraumatic.  Mouth/Throat: Oropharynx is clear and moist. Mucous membranes are dry.  Eyes: Pupils are equal, round, and reactive to light. Conjunctivae and EOM are normal.  Neck: Normal range of motion. Neck supple.  Cardiovascular: Regular rhythm and intact distal pulses. Tachycardia present.  No murmur heard. Pulmonary/Chest: Effort normal and breath sounds normal. No respiratory distress. She has no wheezes. She has no rales.  Abdominal: Soft. She exhibits no distension. There is tenderness in the suprapubic area. There is CVA tenderness. There is no rebound and no guarding.  Genitourinary:  Genitourinary Comments: Cath present and draining yellow urine  Musculoskeletal: Normal range of motion. She exhibits no edema or tenderness.  Neurological: She is alert and oriented to person, place, and time.  Skin: Skin is warm and dry. No rash noted. No erythema.  Psychiatric: She has a normal mood and affect. Her behavior is normal.  Nursing note and vitals reviewed.    ED Treatments / Results  Labs (all labs ordered are listed, but only abnormal results are displayed) Labs Reviewed  COMPREHENSIVE METABOLIC PANEL - Abnormal; Notable for the following components:      Result Value   Glucose, Bld 123 (*)    All other components within normal limits  CBC WITH  DIFFERENTIAL/PLATELET - Abnormal; Notable for the following components:   WBC 11.1 (*)    Neutro Abs 9.6 (*)    All other components within normal limits  URINALYSIS, ROUTINE W REFLEX MICROSCOPIC - Abnormal; Notable for the following components:   Color, Urine AMBER (*)    Hgb urine dipstick MODERATE (*)    Protein, ur 30 (*)    Nitrite POSITIVE (*)    Leukocytes, UA LARGE (*)    RBC / HPF >50 (*)    WBC, UA >50 (*)    Bacteria, UA RARE (*)    All other components within normal limits  CULTURE, BLOOD (ROUTINE X 2)  CULTURE, BLOOD (ROUTINE X 2)  URINE CULTURE  PROTIME-INR  LACTIC ACID, PLASMA  LACTIC ACID, PLASMA    EKG EKG Interpretation  Date/Time:  Sunday July 19 2018 10:43:34 EDT Ventricular Rate:  110 PR Interval:    QRS Duration: 93 QT Interval:  316 QTC Calculation: 428 R Axis:   -12 Text Interpretation:  Sinus tachycardia No previous tracing Confirmed by Blanchie Dessert [phone]) on 07/19/2018 11:19:31 AM   Radiology No results found.  Procedures Procedures (including critical care time)  Medications Ordered in ED Medications  sodium chloride 0.9 % bolus 1,000 mL (1,000 mLs Intravenous New Bag/Given 07/19/18 1050)  morphine 4 MG/ML injection 4 mg (4 mg Intravenous Given 07/19/18 1055)  ondansetron (ZOFRAN) injection 4 mg (4 mg Intravenous Given 07/19/18 1055)     Initial Impression / Assessment and Plan / ED Course  I have reviewed the triage vital signs and the nursing notes.  Pertinent labs & imaging results that were available during my care of the patient were reviewed by me and considered in my medical decision making (see chart for details).     Patient presenting today with a complicated history of interstitial cystitis and chronic pseudomonal urinary infections who recently finished a 10-day course of IV ceftazidime on Friday.  Patient also on Friday had a catheter placed and had a cystoscopy and [facility] urologic procedure  done.  There was no surgery or  stents placed but new cultures were taken from the urine and she was called today because her urine grew out gram-negative rods.  They had plan of putting patient on Keflex however she developed fever overnight and wanted further evaluation.  Patient has symptoms concerning for pyelonephritis with fever, vomiting, back pain in the setting of urine with gram-negative rods.  Code sepsis was initiated.  Patient's blood pressure has been within normal limits.  Patient was given IV fluids, pain and nausea medication. Labs are pending.  12:27 PM UA with nitrite and leukocyte positive.  With the setting of gram-negative rods from [facility] concern for persistent pseudomonal infection.  Patient's white count is mildly elevated at 11 but lactate is within normal limits.  Will cover patient with ceftazidime 2 g per pharmacy and admit as she has been resistant to oral medications in the past.  Final Clinical Impressions(s) / ED Diagnoses   Final diagnoses:  Pyelonephritis  Complicated UTI (urinary tract infection)    ED Discharge Orders    None       Blanchie Dessert, MD 07/19/18 1228

## 2018-07-19 NOTE — ED Notes (Signed)
Lab reported LA was not performed. Requested lab to perform Lactic Acid stat.

## 2018-07-19 NOTE — ED Notes (Signed)
Report given, will medicate for headache and transport to [facility].

## 2018-07-19 NOTE — ED Notes (Signed)
Bed: BP11 Expected date:  Expected time:  Means of arrival:  Comments: 67 yo fever

## 2018-07-19 NOTE — H&P (Signed)
History and Physical    Amber Gutierrez:[id number] DOB: 1951/12/12 DOA: 07/19/2018  PCP: Lucille Passy, MD   Patient coming from: Home  Chief Complaint: "I was sick and running a high fever"   HPI: Amber Gutierrez is a 67 y.o. female with medical history significant of bipolar 2 disorder, chronic cystitis, allergic rhinitis, history of bilateral bladder diverticuli, hypothyroidism, history of recurrent pseudomonal UTIs which is resistant to cefepime and ciprofloxacin, and other comorbidities who presents to [place] along emergency room with a chief complaint of fever, nausea, vomiting and along with back pain.  She states that she woke up feeling sick this morning with a temperature of 103 and started having back pain along with bladder pain.  She started vomiting and vomited at least 3 times and got sick to her stomach.  States the pain was on both sides of her back and typically is worse on the left side however both sides were hurting today.  She described the pain as a sharp pain which is an 8 out of 10 in severity is now down to 6 out of 10 after narcotic medications.  She states ambulation makes her pain worse.  She has had no burning or discomfort but does have a Foley catheter in place that is placed on Friday.  She was seen by at [facility] urology on Friday and had a cystoscopy with hydrodistention for chronic interstitial cystitis along with recurrent urinary tract infection and Pseudomonas.  She was recently treated for pseudomonal UTI it for 10 days with IV Tressie Ellis and had a PICC line placed which was removed Friday.  She subsequently went home after her urological procedure and was feeling fine up until last night when she woke up with fever chills, back pain and nausea vomiting.  States she has felt like this in the past before and knows when she has another UTI.  She denies any chest pain, shortness breath lightheadedness or dizziness.  Her only other complaint is that she has a  headache currently.  She presented to the ED with the symptoms and was worked up and found to have likely another urinary tract infection.  TRH was called to admit this patient for sepsis secondary to a complicated urinary tract infection.  ED Course: Code sepsis was called in the ED and she was given 1 L of normal saline bolus.  She had a urinalysis and urine culture sent along with blood cultures.  Basic blood work was also done.  Urine appeared to show another urinary tract infection.  She was started on IV antibiotics with Tressie Ellis and given pain control with IV morphine and antiemetics with IV Zofran.  She was also given acetaminophen for her temperature.  Review of Systems: As per HPI otherwise 10 point review of systems negative.   Past Medical History:  Diagnosis Date  . Allergic rhinitis   . Bipolar 2 disorder ([place])   . Bladder neoplasm   . Chronic cystitis   . DJD (degenerative joint disease) of lumbar spine    has seen Dr. Joya Salm  . History of basal cell carcinoma excision    2014  --forehead  . History of nephrolithiasis    lithotrypsy calcium stone  . History of radiation therapy    thyroid polyps  1992  . History of recurrent UTIs   . Other postablative hypothyroidism    HX  RAI X2   Past Surgical History:  Procedure Laterality Date  . CESAREAN SECTION  [place]  . CHOLECYSTECTOMY  08/20/2012   Procedure: LAPAROSCOPIC CHOLECYSTECTOMY WITH INTRAOPERATIVE CHOLANGIOGRAM;  Surgeon: Gayland Curry, MD,FACS;  Location: [place];  Service: General;  Laterality: N/A;  . CYSTOSCOPY WITH BIOPSY N/A 08/27/2013   Procedure: CYSTOSCOPY WITH BLADDER BIOPSY/FULGRATION;  Surgeon: Fredricka Bonine, MD;  Location: [facility];  Service: Urology;  Laterality: N/A;  . CYSTOSCOPY WITH BIOPSY N/A 06/20/2017   Procedure: CYSTOSCOPY WITH BIOPSY AND FULGURATION;  Surgeon: Festus Aloe, MD;  Location[facility];  Service: Urology;  Laterality: N/A;  .  EXTRACORPOREAL SHOCK WAVE LITHOTRIPSY  2010  . MASS EXCISION  08/20/2012   Procedure: EXCISION MASS;  Surgeon: Gayland Curry, MD,FACS;  Location: [place];  Service: General;  Laterality: N/A;  . TUBAL LIGATION  1976   SOCIAL HISTORY  reports that she quit smoking about 6 years ago. Her smoking use included cigarettes. She has a 22.50 pack-year smoking history. She has never used smokeless tobacco. She reports that she does not drink alcohol or use drugs.  Allergies  Allergen Reactions  . Pravastatin Sodium Other (See Comments)     Muscle pain and weakness  . Sulfa Antibiotics Hives  . Tramadol Other (See Comments)    dizziness   Family History  Problem Relation Age of Onset  . Heart attack Father        7  deceased  . Alcohol abuse Father   . Breast cancer Sister   . Gallbladder disease Sister   . Bipolar disorder Mother   . Cancer Maternal Grandmother        colon   Prior to Admission medications   Medication Sig Start Date End Date Taking? Authorizing Provider  amitriptyline (ELAVIL) 25 MG tablet Take 50 mg by mouth at bedtime. 07/05/18  Yes [provider]  B Complex Vitamins (VITAMIN B COMPLEX PO) Take 1 capsule by mouth daily.   Yes [provider]  beta carotene w/minerals (OCUVITE) tablet Take 1 tablet by mouth daily.   Yes [provider]  buPROPion (WELLBUTRIN XL) 300 MG 24 hr tablet Take 300 mg by mouth every morning.    Yes [provider]  Cholecalciferol 1000 UNIT/10ML LIQD Take 1,000 Units by mouth at bedtime.   Yes [provider]  HYDROcodone-acetaminophen (NORCO/VICODIN) 5-325 MG tablet Take 1 tablet by mouth 2 (two) times daily as needed for pain. 07/17/18 07/20/18 Yes [provider]  levothyroxine (SYNTHROID, LEVOTHROID) 112 MCG tablet Take 1 tablet (112 mcg total) by mouth daily with breakfast. 09/12/16  Yes Lucille Passy, MD  Melatonin 1 MG TABS Take 1 mg by mouth at bedtime.   Yes [provider]    Multiple Vitamin (MULTIVITAMIN WITH MINERALS) TABS Take 2 tablets by mouth daily.   Yes [provider]  naproxen sodium (ANAPROX) 220 MG tablet Take 220 mg by mouth daily as needed. For pain   Yes [provider]  pentosan polysulfate (ELMIRON) 100 MG capsule Take 200 mg by mouth 2 (two) times daily. 11/04/17  Yes [provider]  phenazopyridine (PYRIDIUM) 200 MG tablet Take 200 mg by mouth 3 (three) times daily as needed for pain. 07/17/18 07/20/18 Yes [provider]  solifenacin (VESICARE) 10 MG tablet Take by mouth daily.   Yes [provider]  tolterodine (DETROL LA) 4 MG 24 hr capsule Take 4 mg by mouth daily. 07/11/18  Yes [provider]   Physical Exam: Vitals:   07/19/18 1125  07/19/18 1130 07/19/18 1159 07/19/18 1215  BP: (!) 146/86 (!) 150/77 (!) 150/77 (!) 165/95  Pulse:      Resp: (!) '27 16 16 ' (!) 23  Temp:   99.6 F (37.6 C)   TempSrc:   Oral   SpO2: 92% 96% 96% 92%  Weight:      Height:       Constitutional: WN/WD morbidly obese NAD and appears calm and comfortable Eyes: Lids and conjunctivae normal, sclerae anicteric  ENMT: External Ears, Nose appear normal. Grossly normal hearing.  Neck: Appears normal, supple, no cervical masses, normal ROM, no appreciable thyromegaly, no JVD Respiratory: Diminished to auscultation bilaterally, no wheezing, rales, rhonchi or crackles. Normal respiratory effort and patient is not tachypenic. No accessory muscle use.  Cardiovascular: Tachycardic Rate but Regular Rhythm, no murmurs / rubs / gallops. S1 and S2 auscultated. Trace extremity edema.  Abdomen: Soft, Tender to palpate Lower Abdomen, Distended 2/2 body habitus. No masses palpated. No appreciable hepatosplenomegaly. Bowel sounds positive x4.  GU: Indwelling Foley in place and is draining appropriately  Musculoskeletal: No clubbing / cyanosis of digits/nails. No joint deformity upper and lower extremities.  Skin: No rashes,  lesions, ulcers on a limited skin eval. No induration; Warm and dry.  Neurologic: CN 2-12 grossly intact with no focal deficits. Romberg sign and cerebellar reflexes not assessed.  Psychiatric: Normal judgment and insight. Alert and oriented x 3. Normal mood and appropriate affect.   Labs on Admission: I have personally reviewed following labs and imaging studies  CBC: Recent Labs  Lab 07/19/18 1050  WBC 11.1*  NEUTROABS 9.6*  HGB 14.7  HCT 42.7  MCV 87.5  PLT 032   Basic Metabolic Panel: Recent Labs  Lab 07/19/18 1050  NA 138  K 3.7  CL 103  CO2 25  GLUCOSE 123*  BUN 15  CREATININE 0.71  CALCIUM 9.0   GFR: Estimated Creatinine Clearance: 75.1 mL/min (by C-G formula based on SCr of 0.71 mg/dL). Liver Function Tests: Recent Labs  Lab 07/19/18 1050  AST 15  ALT 18  ALKPHOS 88  BILITOT 1.1  PROT 7.3  ALBUMIN 3.9   No results for input(s): LIPASE, AMYLASE in the last 168 hours. No results for input(s): AMMONIA in the last 168 hours. Coagulation Profile: Recent Labs  Lab 07/19/18 1050  INR 0.99   Cardiac Enzymes: No results for input(s): CKTOTAL, CKMB, CKMBINDEX, TROPONINI in the last 168 hours. BNP (last 3 results) No results for input(s): PROBNP in the last 8760 hours. HbA1C: No results for input(s): HGBA1C in the last 72 hours. CBG: No results for input(s): GLUCAP in the last 168 hours. Lipid Profile: No results for input(s): CHOL, HDL, LDLCALC, TRIG, CHOLHDL, LDLDIRECT in the last 72 hours. Thyroid Function Tests: No results for input(s): TSH, T4TOTAL, FREET4, T3FREE, THYROIDAB in the last 72 hours. Anemia Panel: No results for input(s): VITAMINB12, FOLATE, FERRITIN, TIBC, IRON, RETICCTPCT in the last 72 hours. Urine analysis:    Component Value Date/Time   COLORURINE AMBER (A) 07/19/2018 1051   APPEARANCEUR CLEAR 07/19/2018 1051   LABSPEC 1.013 07/19/2018 1051   PHURINE 7.0 07/19/2018 1051   GLUCOSEU NEGATIVE 07/19/2018 1051   HGBUR MODERATE  (A) 07/19/2018 1051   BILIRUBINUR NEGATIVE 07/19/2018 1051   KETONESUR NEGATIVE 07/19/2018 1051   PROTEINUR 30 (A) 07/19/2018 1051   UROBILINOGEN 1.0 03/02/2008 2134   NITRITE POSITIVE (A) 07/19/2018 1051   LEUKOCYTESUR LARGE (A) 07/19/2018 1051   Sepsis Labs: !!!!!!!!!!!!!!!!!!!!!!!!!!!!!!!!!!!!!!!!!!!! '@LABRCNTIP' (procalcitonin:4,lacticidven:4) )No results found for  this or any previous visit (from the past 240 hour(s)).   Radiological Exams on Admission: No results found.  EKG: Independently reviewed. Showed Sinus Tachycardia at a rate of 110 with no ST Elevation or Depression on my interpretation.   Assessment/Plan Active Problems:   Hypothyroidism   Recurrent UTI   NEPHROLITHIASIS, HX OF   Bipolar depression (HCC)   History of recurrent UTIs   Cystitis   Complicated UTI (urinary tract infection)   Sepsis secondary to UTI ([place])   Leukocytosis   Sepsis due to gram-negative UTI (HCC)   Hyperglycemia  Sepsis 2/2 to Complicated GNR UTI, likely re-current Pseudomonas  -Admit to Med Surge -Met Sepsis criteria on admission as she had a temperature of 102.3, was tachycardic, tachypneic, had a WBC of 11,100 with a source of infection in the urine -She had a urine culture sent at [facility] on Friday, 07/17/2018 which is growing gram-negative rods with defecation of the organism and sensitivities to follow -Recently had hydrodistention and cystoscopy at [facility] with the urologist.  Her urologist is Dr. Amalia Hailey and she was told to keep the Foley catheter until Wednesday -Patient had a PICC line and for pseudomonal UTI that was removed on Friday and has not been on antibiotics since then -Likely this is recurrent Pseudomonas urinary tract infection -Patient recently was treated with Tressie Ellis for 10 days given her cefepime resistant and ciprofloxacin resistant urinary tract infection -Blood cultures x2 sent and repeat urinalysis and urine culture here -Urinalysis showed  amber color urine, moderate hemoglobin, large leukocytes, positive nitrites, rare bacteria, greater than 50 WBCs and greater than 50 red blood cells per high-power field -Follow-up on blood cultures and urine cultures -We will need to contact [facility] tomorrow to find out sensitivities of Urine Cx there -Given 1 L of normal saline bolus in the ED and will give an additional 1 L and placed patient on maintenance IV fluid with normal saline at rate of 100 and mils per hour -LA normal at 0.8 -> 1.7; but check PCT -We will obtain renal ultrasound to rule out hydronephrosis -Patient was given IV morphine 4 mg in the ED along with IV Zofran and 650 mg of p.o. acetaminophen and started on normal saline and given a normal saline bolus. -Pain control with home Hydrocodone-Acetaminophen 5-325 mg 1 tablet p.o. twice daily as needed severe pain -Repeat CBC and CMP in the a.m. and follow up on cultures and culture sensitivities -Discussed case with both infectious diseases Dr. Johnnye Sima along with urology Dr. Lafayette Dragon who recommended continuing antibiotics and Foley catheter at this time -We will need to see [facility] once discharged   Leukocytosis -Secondary to above -Continue to monitor for signs and symptoms of infection -Repeat CBC in a.m.  Recurrent Pseudomonal UTI's and Recurrent and Chronic Interstitial Cystitis Hx of Nephrolithiasis  -As above -Continue with Vesicare substitution with Darifenacin 15 mg p.o. daily, Tolterodine substitution with Fesoterodine 8 mg p.o. daily, along with Pyridium 200 mg p.o. 3 times daily with meals, along with Pentosan Polysulfate 20 mg p.o. twice daily  Hypothyroidism -Caused by Radioactive Iodine and Hx of XRT for Thyroid Polyps -Check TSH -C/w Levothyroxine 112 mcg po Daily  Hx of Bipolar 2 Disorder  -C/w Amitriptyline 50 mg po qHS and bupropion XL 300 mg p.o. every morning  Hyperglycemia -Likely Reactive in the setting of  Complicated UTI -Check ZOX0R in AM   Morbid Obesity -Patient's BMI was 40.97 kg/m2 -Weight  Loss Counseling Given  DVT prophylaxis: Heparin 5,000 units sq q8h Code Status: FULL CODE  Family Communication: No family present at bedside  Disposition Plan: Anticipate D/C Back to [place] called: Discussed Case with Dr. Gloriann Loan of Urology and Dr. Johnnye Sima of ID Admission status: Inpatient Med-Surge  Severity of Illness: The appropriate patient status for this patient is INPATIENT. Inpatient status is judged to be reasonable and necessary in order to provide the required intensity of service to ensure the patient's safety. The patient's presenting symptoms, physical exam findings, and initial radiographic and laboratory data in the context of their chronic comorbidities is felt to place them at high risk for further clinical deterioration. Furthermore, it is not anticipated that the patient will be medically stable for discharge from the hospital within 2 midnights of admission. The following factors support the patient status of inpatient.   " The patient's presenting symptoms include nausea, vomiting, fever, back pain and bladder pain. " The worrisome physical exam findings include flank pain and bladder pain on palpation. " The initial radiographic and laboratory data are worrisome because of urinalysis and mild elevation of white count. " The chronic co-morbidities include chronic interstitial cystitis, history of recurrent urinary tract infection with Pseudomonas that is resistant to IV cefepime and IV ciprofloxacin, hypothyroidism, bipolar 2 disorder, arthritis.  * I certify that at the point of admission it is my clinical judgment that the patient will require inpatient hospital care spanning beyond 2 midnights from the point of admission due to high intensity of service, high risk for further deterioration and high frequency of surveillance required.Kerney Elbe,  D.O. [facility] Pager [phone]  If 7PM-7AM, please contact night-coverage [email or web address] Password [email or web address]  07/19/2018, 1:52 PM

## 2018-07-20 DIAGNOSIS — E032 Hypothyroidism due to medicaments and other exogenous substances: Secondary | ICD-10-CM

## 2018-07-20 DIAGNOSIS — R739 Hyperglycemia, unspecified: Secondary | ICD-10-CM

## 2018-07-20 DIAGNOSIS — Z87440 Personal history of urinary (tract) infections: Secondary | ICD-10-CM

## 2018-07-20 DIAGNOSIS — N390 Urinary tract infection, site not specified: Secondary | ICD-10-CM

## 2018-07-20 DIAGNOSIS — F319 Bipolar disorder, unspecified: Secondary | ICD-10-CM

## 2018-07-20 DIAGNOSIS — N12 Tubulo-interstitial nephritis, not specified as acute or chronic: Secondary | ICD-10-CM

## 2018-07-20 LAB — COMPREHENSIVE METABOLIC PANEL
ALT: 31 U/L (ref 0–44)
AST: 34 U/L (ref 15–41)
Albumin: 3.2 g/dL — ABNORMAL LOW (ref 3.5–5.0)
Alkaline Phosphatase: 76 U/L (ref 38–126)
Anion gap: 6 (ref 5–15)
BUN: 11 mg/dL (ref 8–23)
CHLORIDE: 102 mmol/L (ref 98–111)
CO2: 28 mmol/L (ref 22–32)
CREATININE: 0.77 mg/dL (ref 0.44–1.00)
Calcium: 8.3 mg/dL — ABNORMAL LOW (ref 8.9–10.3)
GFR calc non Af Amer: >60 mL/min (ref >60)
Glucose, Bld: 110 mg/dL — ABNORMAL HIGH (ref 70–99)
Potassium: 3.8 mmol/L (ref 3.5–5.1)
SODIUM: 136 mmol/L (ref 135–145)
Total Bilirubin: 1.3 mg/dL — ABNORMAL HIGH (ref 0.3–1.2)
Total Protein: 6.3 g/dL — ABNORMAL LOW (ref 6.5–8.1)

## 2018-07-20 LAB — CBC WITH DIFFERENTIAL/PLATELET
Basophils Absolute: 0 10*3/uL (ref 0.0–0.1)
Basophils Relative: 0 %
Eosinophils Absolute: 0 10*3/uL (ref 0.0–0.7)
Eosinophils Relative: 0 %
HCT: 42 % (ref 36.0–46.0)
HEMOGLOBIN: 13.9 g/dL (ref 12.0–15.0)
LYMPHS ABS: 0.6 10*3/uL — AB (ref 0.7–4.0)
LYMPHS PCT: 5 %
MCH: 29.3 pg (ref 26.0–34.0)
MCHC: 33.1 g/dL (ref 30.0–36.0)
MCV: 88.6 fL (ref 78.0–100.0)
MONOS PCT: 11 %
Monocytes Absolute: 1.2 10*3/uL — ABNORMAL HIGH (ref 0.1–1.0)
NEUTROS ABS: 9.1 10*3/uL — AB (ref 1.7–7.7)
NEUTROS PCT: 84 %
Platelets: 151 10*3/uL (ref 150–400)
RBC: 4.74 MIL/uL (ref 3.87–5.11)
RDW: 13.6 % (ref 11.5–15.5)
WBC: 10.9 10*3/uL — ABNORMAL HIGH (ref 4.0–10.5)

## 2018-07-20 LAB — GLUCOSE, CAPILLARY: GLUCOSE-CAPILLARY: 109 mg/dL — AB (ref 70–99)

## 2018-07-20 LAB — HEMOGLOBIN A1C
HEMOGLOBIN A1C: 5 % (ref 4.8–5.6)
Mean Plasma Glucose: 96.8 mg/dL

## 2018-07-20 LAB — URINE CULTURE: Culture: <10000 — AB

## 2018-07-20 LAB — PROCALCITONIN: Procalcitonin: <0.1 ng/mL

## 2018-07-20 LAB — MAGNESIUM: Magnesium: 1.6 mg/dL — ABNORMAL LOW (ref 1.7–2.4)

## 2018-07-20 LAB — PHOSPHORUS: Phosphorus: 2.2 mg/dL — ABNORMAL LOW (ref 2.5–4.6)

## 2018-07-20 LAB — TSH: TSH: 0.603 u[IU]/mL (ref 0.350–4.500)

## 2018-07-20 LAB — HIV ANTIBODY (ROUTINE TESTING W REFLEX): HIV Screen 4th Generation wRfx: NONREACTIVE

## 2018-07-20 MED ORDER — MAGNESIUM SULFATE 2 GM/50ML IV SOLN
2.0000 g | Freq: Once | INTRAVENOUS | Status: AC
  Administered 2018-07-20: 2 g via INTRAVENOUS
  Filled 2018-07-20: qty 50

## 2018-07-20 MED ORDER — FOSFOMYCIN TROMETHAMINE 3 G PO PACK
3.0000 g | PACK | Freq: Once | ORAL | Status: AC
  Administered 2018-07-20: 3 g via ORAL
  Filled 2018-07-20: qty 3

## 2018-07-20 NOTE — Discharge Summary (Signed)
Physician Discharge Summary  Amber Gutierrez:[id number] DOB: 18-Mar-1951 DOA: 07/19/2018  PCP: Lucille Passy, MD  Admit date: 07/19/2018 Discharge date: 07/20/2018  Admitted From: home Disposition:  home Recommendations for Outpatient Follow-up:  1. Follow up with PCP in 1-2 weeks 2. Please obtain BMP/CBC in one week 3. Follow up with dr Amalia Hailey urology [place]  Home Health:none Equipment/Devices:none Discharge Condition stable CODE STATUS:full Diet recommendation:cardiac Brief/Interim Summary:67 y.o. female with medical history significant of bipolar 2 disorder, chronic cystitis, allergic rhinitis, history of bilateral bladder diverticuli, hypothyroidism, history of recurrent pseudomonal UTIs which is resistant to cefepime and ciprofloxacin, and other comorbidities who presents to [place] [facility] emergency room with a chief complaint of fever, nausea, vomiting and along with back pain.  She states that she woke up feeling sick this morning with a temperature of 103 and started having back pain along with bladder pain.  She started vomiting and vomited at least 3 times and got sick to her stomach.  States the pain was on both sides of her back and typically is worse on the left side however both sides were hurting today.  She described the pain as a sharp pain which is an 8 out of 10 in severity is now down to 6 out of 10 after narcotic medications.  She states ambulation makes her pain worse.  She has had no burning or discomfort but does have a Foley catheter in place that is placed on Friday.  She was seen by at [facility] urology on Friday and had a cystoscopy with hydrodistention for chronic interstitial cystitis along with recurrent urinary tract infection and Pseudomonas.  She was recently treated for pseudomonal UTI it for 10 days with IV Tressie Ellis and had a PICC line placed which was removed Friday.  She subsequently went home after her urological procedure and was feeling fine up  until last night when she woke up with fever chills, back pain and nausea vomiting.  States she has felt like this in the past before and knows when she has another UTI.  She denies any chest pain, shortness breath lightheadedness or dizziness.  Her only other complaint is that she has a headache currently.  She presented to the ED with the symptoms and was worked up and found to have likely another urinary tract infection.  TRH was called to admit this patient for sepsis secondary to a complicated urinary tract infection.  ED Course: Code sepsis was called in the ED and she was given 1 L of normal saline bolus.  She had a urinalysis and urine culture sent along with blood cultures.  Basic blood work was also done.  Urine appeared to show another urinary tract infection.  She was started on IV antibiotics with Tressie Ellis and given pain control with IV morphine and antiemetics with IV Zofran.  She was also given acetaminophen for her temp    Discharge Diagnoses:  Active Problems:   Hypothyroidism   Recurrent UTI   NEPHROLITHIASIS, HX OF   Bipolar depression (HCC)   History of recurrent UTIs   Cystitis   Complicated UTI (urinary tract infection)   Sepsis secondary to UTI ([place])   Leukocytosis   Sepsis due to gram-negative UTI (HCC)   Hyperglycemia  1]Urosepsis-Met Sepsis criteria on admission as she had a temperature of 102.3, was tachycardic, tachypneic, had a WBC of 11,100 with a source of infection in the urine -She had a urine culture sent at [facility] on Friday,  07/17/2018 which is growing gram-negative rods with defecation of the organism and sensitivities to follow -Recently had hydrodistention and cystoscopy at [facility] with the urologist.  Her urologist is Dr. Amalia Hailey and she was told to keep the Foley catheter until Wednesday -Patient had a PICC line and for pseudomonal UTI that was removed on Friday and has not been on antibiotics since then -Likely this is recurrent  Pseudomonas urinary tract infection -Patient recently was treated with Tressie Ellis for 10 days given her cefepime resistant and ciprofloxacin resistant urinary tract infection -Blood cultures x2 sent and repeat urinalysis and urine culture here -Urinalysis showed amber color urine, moderate hemoglobin, large leukocytes, positive nitrites, rare bacteria, greater than 50 WBCs and greater than 50 red blood cells per high-power field - blood cultures and urine cultures done pending. -Renal ultrasound no evidence of hydronephrosis. PATIENT VERY ADAMANT AND REQUESTING DISCHARGE TODAY SO SHE CAN FOLLOW UP WITH DR EVANS HER UROLOGIST AT [facility].  I did explain to her that she is not ready to be discharged.  Patient has had a Foley catheter in place which is due to come out tomorrow.  I did tell her that we can take out the catheter out here to have a PICC line placed if needed and that she can follow-up with her urologist later but she was adamant about being discharged today so she can follow-up with her urologist tomorrow.  Patient with history of recurrent UTIs with Pseudomonas and chronic interstitial cystitis with history of kidney stones.  Continue Vesicare, tolterodine, polysulfate and fesoterodine,pyridium.  Hypothyroidism -Caused by Radioactive Iodine and Hx of XRT for Thyroid Polyps -Check TSH -C/w Levothyroxine 112 mcg po Daily  Hx of Bipolar 2 Disorder  -C/w Amitriptyline 50 mg po qHS and bupropion XL 300 mg p.o. every morning  Hyperglycemia -Likely Reactive in the setting of Complicated UTI -Check KWI0X in AM   Morbid Obesity -Patient's BMI was 40.97 kg/m2 -Weight Loss Counseling Given     Discharge Instructions  Discharge Instructions    Call MD for:  difficulty breathing, headache or visual disturbances   Complete by:  As directed    Call MD for:  persistant dizziness or light-headedness   Complete by:  As directed    Call MD for:  persistant nausea and vomiting    Complete by:  As directed    Call MD for:  severe uncontrolled pain   Complete by:  As directed    Call MD for:  temperature >100.4   Complete by:  As directed    Diet - low sodium heart healthy   Complete by:  As directed    Increase activity slowly   Complete by:  As directed      Allergies as of 07/20/2018      Reactions   Pravastatin Sodium Other (See Comments)    Muscle pain and weakness   Sulfa Antibiotics Hives   Tramadol Other (See Comments)   dizziness      Medication List    STOP taking these medications   naproxen sodium 220 MG tablet Commonly known as:  ALEVE     TAKE these medications   amitriptyline 25 MG tablet Commonly known as:  ELAVIL Take 50 mg by mouth at bedtime.   beta carotene w/minerals tablet Take 1 tablet by mouth daily.   Cholecalciferol 1000 UNIT/10ML Liqd Take 1,000 Units by mouth at bedtime.   HYDROcodone-acetaminophen 5-325 MG tablet Commonly known as:  NORCO/VICODIN Take 1 tablet by mouth  2 (two) times daily as needed for pain.   levothyroxine 112 MCG tablet Commonly known as:  SYNTHROID, LEVOTHROID Take 1 tablet (112 mcg total) by mouth daily with breakfast.   Melatonin 1 MG Tabs Take 1 mg by mouth at bedtime.   multivitamin with minerals Tabs tablet Take 2 tablets by mouth daily.   pentosan polysulfate 100 MG capsule Commonly known as:  ELMIRON Take 200 mg by mouth 2 (two) times daily.   phenazopyridine 200 MG tablet Commonly known as:  PYRIDIUM Take 200 mg by mouth 3 (three) times daily as needed for pain.   solifenacin 10 MG tablet Commonly known as:  VESICARE Take by mouth daily.   tolterodine 4 MG 24 hr capsule Commonly known as:  DETROL LA Take 4 mg by mouth daily.   VITAMIN B COMPLEX PO Take 1 capsule by mouth daily.   WELLBUTRIN XL 300 MG 24 hr tablet Generic drug:  buPROPion Take 300 mg by mouth every morning.       Allergies  Allergen Reactions  . Pravastatin Sodium Other (See Comments)      Muscle pain and weakness  . Sulfa Antibiotics Hives  . Tramadol Other (See Comments)    dizziness    Consultations:  none   Procedures/Studies: US Renal  Result Date: 07/19/2018 CLINICAL DATA:  Urinary tract infection.  Nephrolithiasis. EXAM: RENAL / URINARY TRACT ULTRASOUND COMPLETE COMPARISON:  CT on 08/14/2017 FINDINGS: Right Kidney: Length: 11.0 cm. Echogenicity within normal limits. No mass or hydronephrosis visualized. Left Kidney: Length: 11.6 cm. Suboptimal visualization due to large to patient habitus. Echogenicity within normal limits. No mass or hydronephrosis visualized. Bladder: Empty with Foley catheter in place. IMPRESSION: No evidence of hydronephrosis. Electronically Signed   By: Earle Gell M.D.   On: 07/19/2018 18:12   (Echo, Carotid, EGD, Colonoscopy, ERCP)    Subjective:   Discharge Exam: Vitals:   07/20/18 0012 07/20/18 0544  BP:  (!) 155/89  Pulse:  (!) 110  Resp:  18  Temp: 100.3 F (37.9 C) (!) 100.4 F (38 C)  SpO2:  93%   Vitals:   07/19/18 1800 07/19/18 2226 07/20/18 0012 07/20/18 0544  BP:  125/73  (!) 155/89  Pulse:  (!) 108  (!) 110  Resp:  18  18  Temp: (!) 101.9 F (38.8 C) (!) 102.9 F (39.4 C) 100.3 F (37.9 C) (!) 100.4 F (38 C)  TempSrc: Oral Oral Oral Oral  SpO2:  90%  93%  Weight:      Height:        General: Pt is alert, awake, not in acute distress Cardiovascular: RRR, S1/S2 +, no rubs, no gallops Respiratory: CTA bilaterally, no wheezing, no rhonchi Abdominal: Soft, NT, ND, bowel sounds + Extremities: no edema, no cyanosis    The results of significant diagnostics from this hospitalization (including imaging, microbiology, ancillary and laboratory) are listed below for reference.     Microbiology: No results found for this or any previous visit (from the past 240 hour(s)).   Labs: BNP (last 3 results) No results for input(s): BNP in the last 8760 hours. Basic Metabolic Panel: Recent Labs  Lab 07/19/18 1050  07/19/18 1539 07/20/18 0515  NA 138  --  136  K 3.7  --  3.8  CL 103  --  102  CO2 25  --  28  GLUCOSE 123*  --  110*  BUN 15  --  11  CREATININE 0.71  --  0.77  CALCIUM  9.0  --  8.3*  MG  --  1.6* 1.6*  PHOS  --  2.5 2.2*   Liver Function Tests: Recent Labs  Lab 07/19/18 1050 07/20/18 0515  AST 15 34  ALT 18 31  ALKPHOS 88 76  BILITOT 1.1 1.3*  PROT 7.3 6.3*  ALBUMIN 3.9 3.2*   No results for input(s): LIPASE, AMYLASE in the last 168 hours. No results for input(s): AMMONIA in the last 168 hours. CBC: Recent Labs  Lab 07/19/18 1050 07/20/18 0515  WBC 11.1* 10.9*  NEUTROABS 9.6* 9.1*  HGB 14.7 13.9  HCT 42.7 42.0  MCV 87.5 88.6  PLT 186 151   Cardiac Enzymes: No results for input(s): CKTOTAL, CKMB, CKMBINDEX, TROPONINI in the last 168 hours. BNP: Invalid input(s): POCBNP CBG: Recent Labs  Lab 07/20/18 0822  GLUCAP 109*   D-Dimer No results for input(s): DDIMER in the last 72 hours. Hgb A1c Recent Labs    07/20/18 0515  HGBA1C 5.0   Lipid Profile No results for input(s): CHOL, HDL, LDLCALC, TRIG, CHOLHDL, LDLDIRECT in the last 72 hours. Thyroid function studies Recent Labs    07/20/18 0515  TSH 0.603   Anemia work up No results for input(s): VITAMINB12, FOLATE, FERRITIN, TIBC, IRON, RETICCTPCT in the last 72 hours. Urinalysis    Component Value Date/Time   COLORURINE AMBER (A) 07/19/2018 1051   APPEARANCEUR CLEAR 07/19/2018 1051   LABSPEC 1.013 07/19/2018 1051   PHURINE 7.0 07/19/2018 1051   GLUCOSEU NEGATIVE 07/19/2018 1051   HGBUR MODERATE (A) 07/19/2018 1051   BILIRUBINUR NEGATIVE 07/19/2018 1051   KETONESUR NEGATIVE 07/19/2018 1051   PROTEINUR 30 (A) 07/19/2018 1051   UROBILINOGEN 1.0 03/02/2008 2134   NITRITE POSITIVE (A) 07/19/2018 1051   LEUKOCYTESUR LARGE (A) 07/19/2018 1051   Sepsis Labs Invalid input(s): PROCALCITONIN,  WBC,  LACTICIDVEN Microbiology No results found for this or any previous visit (from the past 240  hour(s)).   Time coordinating discharge: 35 minutes  SIGNED:   Georgette Shell, MD  [facility] 07/20/2018, 10:19 AM Pager   If 7PM-7AM, please contact night-coverage [email or web address] Password [email or web address]

## 2018-07-20 NOTE — Progress Notes (Signed)
Patient discharged to home, all discharge medications and instructions reviewed and questions answered.  Patient to be assisted to vehicle by wheelchair.  

## 2018-07-21 ENCOUNTER — Telehealth: Payer: Self-pay | Admitting: Behavioral Health

## 2018-07-21 DIAGNOSIS — N3000 Acute cystitis without hematuria: Secondary | ICD-10-CM | POA: Diagnosis not present

## 2018-07-21 DIAGNOSIS — B965 Pseudomonas (aeruginosa) (mallei) (pseudomallei) as the cause of diseases classified elsewhere: Secondary | ICD-10-CM | POA: Diagnosis not present

## 2018-07-21 DIAGNOSIS — Z466 Encounter for fitting and adjustment of urinary device: Secondary | ICD-10-CM | POA: Diagnosis not present

## 2018-07-21 NOTE — Telephone Encounter (Signed)
Unable to reach patient at the time of TCM/Hospital Follow-up call. Left message for patient to return call when available.    

## 2018-07-24 LAB — CULTURE, BLOOD (ROUTINE X 2)
CULTURE: NO GROWTH
CULTURE: NO GROWTH
SPECIAL REQUESTS: ADEQUATE
Special Requests: ADEQUATE

## 2018-08-04 ENCOUNTER — Ambulatory Visit (INDEPENDENT_AMBULATORY_CARE_PROVIDER_SITE_OTHER): Payer: PPO | Admitting: Family Medicine

## 2018-08-04 ENCOUNTER — Encounter: Payer: Self-pay | Admitting: Family Medicine

## 2018-08-04 VITALS — BP 126/68 | HR 66 | Temp 98.8°F | Ht 62.0 in | Wt 230.6 lb

## 2018-08-04 DIAGNOSIS — A419 Sepsis, unspecified organism: Secondary | ICD-10-CM

## 2018-08-04 DIAGNOSIS — N390 Urinary tract infection, site not specified: Secondary | ICD-10-CM | POA: Diagnosis not present

## 2018-08-04 DIAGNOSIS — Z1239 Encounter for other screening for malignant neoplasm of breast: Secondary | ICD-10-CM

## 2018-08-04 DIAGNOSIS — A4150 Gram-negative sepsis, unspecified: Secondary | ICD-10-CM

## 2018-08-04 DIAGNOSIS — Z09 Encounter for follow-up examination after completed treatment for conditions other than malignant neoplasm: Secondary | ICD-10-CM

## 2018-08-04 DIAGNOSIS — E039 Hypothyroidism, unspecified: Secondary | ICD-10-CM

## 2018-08-04 NOTE — Assessment & Plan Note (Addendum)
Last dose of abx yesterday. Followed very closely by her urologist, Dr. Amalia Hailey at [facility], given complexity of this patient- multiple recurrent UTIs with pseudomonas, chronic IC and kidney stones. She feels well today.  Will check BMET and CBC and forward results to Dr. Amalia Hailey. Orders Placed This Encounter  Procedures  . Basic metabolic panel  . CBC with Differential/Platelet

## 2018-08-04 NOTE — Patient Instructions (Addendum)
Great to see you. I will call you with your lab results from today and you can view them online.   Please call the breast center at [phone] to schedule your mammogram.  

## 2018-08-04 NOTE — Progress Notes (Signed)
Subjective:   Patient ID: Amber Gutierrez, female    DOB: 1951-12-16, 67 y.o.   MRN: [id number]  Amber Gutierrez is a pleasant 67 y.o. year old female who presents to clinic today with Hospitalization Follow-up (Patient is here today for a hospital F/U.  She was taken via EMS febrile at 102.  Admitted wot [facility] from 7.21.19-7.22.19 for Pyelonephritis with sepsis with H/O recurrent Pseudomonal UTI's.  Tx with Tressie Ellis via IV with Morphine and Zofran for pain and nausea control.  To F/U with PCP in 1-2 weeks ang get BMP & CBC.  Also is to F/U with Dr. Amalia Hailey Urologist in [place] which is scheduled on 16th.  She states that she feels fine today.  Completed Abx 8.5.19. She agrees to get Mammogram at the breast center.)  on 08/04/2018  HPI:  Hospital follow up-  Admitted [facility] 07/19/18- 07/20/18 after presenting with nausea, vomiting, back pain.  H/o recurrent pseudomonal UTIs (multiple resistances in past).  Code sepsis was called in the ED and given 1 L of NS bolus and urine cx, blood cx done. Started on IV Fortaz, IV morphone and zofran as needed.  Blood cx neg x 2, second urine cx showed insignificant growth.  Has follow up scheduled with urology, Dr. Amalia Hailey at [facility] next Friday.  Feels fine today.  Was advised to see me today for follow up CBC and BMET.  US Renal  Result Date: 07/19/2018 CLINICAL DATA:  Urinary tract infection.  Nephrolithiasis. EXAM: RENAL / URINARY TRACT ULTRASOUND COMPLETE COMPARISON:  CT on 08/14/2017 FINDINGS: Right Kidney: Length: 11.0 cm. Echogenicity within normal limits. No mass or hydronephrosis visualized. Left Kidney: Length: 11.6 cm. Suboptimal visualization due to large to patient habitus. Echogenicity within normal limits. No mass or hydronephrosis visualized. Bladder: Empty with Foley catheter in place. IMPRESSION: No evidence of hydronephrosis. Electronically Signed   By: Earle Gell M.D.   On: 07/19/2018 18:12   Lab Results  Component Value Date   WBC 10.9 (H)  07/20/2018   HGB 13.9 07/20/2018   HCT 42.0 07/20/2018   MCV 88.6 07/20/2018   PLT 151 07/20/2018   Lab Results  Component Value Date   CREATININE 0.77 07/20/2018   Lab Results  Component Value Date   NA 136 07/20/2018   K 3.8 07/20/2018   CL 102 07/20/2018   CO2 28 07/20/2018         Current Outpatient Medications on File Prior to Visit  Medication Sig Dispense Refill  . amitriptyline (ELAVIL) 25 MG tablet Take 50 mg by mouth at bedtime.  11  . B Complex Vitamins (VITAMIN B COMPLEX PO) Take 1 capsule by mouth daily.    . beta carotene w/minerals (OCUVITE) tablet Take 1 tablet by mouth daily.    Marland Kitchen buPROPion (WELLBUTRIN XL) 300 MG 24 hr tablet Take 300 mg by mouth every morning.     . Cholecalciferol 1000 UNIT/10ML LIQD Take 1,000 Units by mouth at bedtime.    Marland Kitchen levothyroxine (SYNTHROID, LEVOTHROID) 112 MCG tablet Take 1 tablet (112 mcg total) by mouth daily with breakfast. 90 tablet 3  . Melatonin 1 MG TABS Take 10 mg by mouth at bedtime.     . Multiple Vitamin (MULTIVITAMIN WITH MINERALS) TABS Take 2 tablets by mouth daily.    . pentosan polysulfate (ELMIRON) 100 MG capsule Take 200 mg by mouth 2 (two) times daily.    Marland Kitchen tolterodine (DETROL LA) 4 MG 24 hr capsule Take 4 mg by mouth daily.  11   No current facility-administered medications on file prior to visit.     Allergies  Allergen Reactions  . Pravastatin Sodium Other (See Comments)     Muscle pain and weakness  . Sulfa Antibiotics Hives  . Tramadol Other (See Comments)    dizziness    Past Medical History:  Diagnosis Date  . Allergic rhinitis   . Bipolar 2 disorder ([place])   . Bladder neoplasm   . Chronic cystitis   . DJD (degenerative joint disease) of lumbar spine    has seen Dr. Joya Salm  . History of basal cell carcinoma excision    2014  --forehead  . History of nephrolithiasis    lithotrypsy calcium stone  . History of radiation therapy    thyroid polyps  1992  . History of recurrent UTIs   .  Other postablative hypothyroidism    HX  RAI X2    Past Surgical History:  Procedure Laterality Date  . [place]  . CHOLECYSTECTOMY  08/20/2012   Procedure: LAPAROSCOPIC CHOLECYSTECTOMY WITH INTRAOPERATIVE CHOLANGIOGRAM;  Surgeon: Gayland Curry, MD,FACS;  Location: [place];  Service: General;  Laterality: N/A;  . CYSTOSCOPY WITH BIOPSY N/A 08/27/2013   Procedure: CYSTOSCOPY WITH BLADDER BIOPSY/FULGRATION;  Surgeon: Fredricka Bonine, MD;  Location[facility];  Service: Urology;  Laterality: N/A;  . CYSTOSCOPY WITH BIOPSY N/A 06/20/2017   Procedure: CYSTOSCOPY WITH BIOPSY AND FULGURATION;  Surgeon: Festus Aloe, MD;  Location[facility];  Service: Urology;  Laterality: N/A;  . EXTRACORPOREAL SHOCK WAVE LITHOTRIPSY  2010  . MASS EXCISION  08/20/2012   Procedure: EXCISION MASS;  Surgeon: Gayland Curry, MD,FACS;  Location: [place];  Service: General;  Laterality: N/A;  . TUBAL LIGATION  1976    Family History  Problem Relation Age of Onset  . Heart attack Father        17  deceased  . Alcohol abuse Father   . Breast cancer Sister   . Gallbladder disease Sister   . Bipolar disorder Mother   . Cancer Maternal Grandmother        colon    Social History   Socioeconomic History  . Marital status: Married    Spouse name: Not on file  . Number of children: Not on file  . Years of education: Not on file  . Highest education level: Not on file  Occupational History  . Not on file  Social Needs  . Financial resource strain: Not on file  . Food insecurity:    Worry: Not on file    Inability: Not on file  . Transportation needs:    Medical: Not on file    Non-medical: Not on file  Tobacco Use  . Smoking status: Former Smoker    Packs/day: 0.50    Years: 45.00    Pack years: 22.50    Types: Cigarettes    Last attempt to quit: 06/29/2012    Years since quitting: 6.1  . Smokeless tobacco: Never Used  Substance and Sexual  Activity  . Alcohol use: No  . Drug use: No  . Sexual activity: Never  Lifestyle  . Physical activity:    Days per week: Not on file    Minutes per session: Not on file  . Stress: Not on file  Relationships  . Social connections:    Talks on phone: Not on file    Gets together: Not on file  Attends religious service: Not on file    Active member of club or organization: Not on file    Attends meetings of clubs or organizations: Not on file    Relationship status: Not on file  . Intimate partner violence:    Fear of current or ex partner: Not on file    Emotionally abused: Not on file    Physically abused: Not on file    Forced sexual activity: Not on file  Other Topics Concern  . Not on file  Social History Narrative   Married husband relapsing and remitting  MS. He smokes usually not around her   Regular exercise- yes   Self employed- computer   originally from [place] in women [place]   G2P2   Full code   The PMH, [place], Social History, Family History, Medications, and allergies have been reviewed in [facility], and have been updated if relevant.  Review of Systems  Constitutional: Negative.   Cardiovascular: Negative.   Gastrointestinal: Negative.   Genitourinary: Negative.   Musculoskeletal: Negative.   Neurological: Negative.   Hematological: Negative.   Psychiatric/Behavioral: Negative.   All other systems reviewed and are negative.      Objective:    BP 126/68 (BP Location: Right Arm, Patient Position: Sitting, Cuff Size: Large)   Pulse 66   Temp 98.8 F (37.1 C) (Oral)   Ht 5\' 2"  (1.575 m)   Wt 230 lb 9.6 oz (104.6 kg)   SpO2 96%   BMI 42.18 kg/m    Physical Exam  Constitutional: She is oriented to person, place, and time. She appears well-developed. No distress.  HENT:  Head: Normocephalic and atraumatic.  Eyes: Pupils are equal, round, and reactive to light. EOM are normal.  Neck: Normal range of motion.  Cardiovascular: Normal rate.    Pulmonary/Chest: Effort normal.  Abdominal: Soft. Bowel sounds are normal. She exhibits no distension.  Musculoskeletal: Normal range of motion. She exhibits no edema.  Neurological: She is alert and oriented to person, place, and time. No cranial nerve deficit.  Skin: Skin is warm and dry. She is not diaphoretic.  Psychiatric: She has a normal mood and affect. Her behavior is normal. Judgment and thought content normal.  Nursing note and vitals reviewed.         Assessment & Plan:   Sepsis due to gram-negative UTI ([place]) - Plan: Basic metabolic panel, CBC with Differential/Platelet  Recurrent UTI  Sepsis secondary to UTI [facility])  Hospital discharge follow-up No follow-ups on file.

## 2018-08-05 LAB — CBC WITH DIFFERENTIAL/PLATELET
BASOS PCT: 1.1 % (ref 0.0–3.0)
Basophils Absolute: 0.1 10*3/uL (ref 0.0–0.1)
EOS ABS: 0.1 10*3/uL (ref 0.0–0.7)
EOS PCT: 2.1 % (ref 0.0–5.0)
HEMATOCRIT: 46.6 % — AB (ref 36.0–46.0)
HEMOGLOBIN: 15.7 g/dL — AB (ref 12.0–15.0)
Lymphocytes Relative: 16.7 % (ref 12.0–46.0)
Lymphs Abs: 1.1 10*3/uL (ref 0.7–4.0)
MCHC: 33.7 g/dL (ref 30.0–36.0)
MCV: 85.2 fl (ref 78.0–100.0)
MONO ABS: 0.4 10*3/uL (ref 0.1–1.0)
Monocytes Relative: 6.7 % (ref 3.0–12.0)
NEUTROS ABS: 4.8 10*3/uL (ref 1.4–7.7)
Neutrophils Relative %: 73.4 % (ref 43.0–77.0)
PLATELETS: 314 10*3/uL (ref 150.0–400.0)
RBC: 5.47 Mil/uL — ABNORMAL HIGH (ref 3.87–5.11)
RDW: 13.9 % (ref 11.5–15.5)
WBC: 6.5 10*3/uL (ref 4.0–10.5)

## 2018-08-05 LAB — TSH: TSH: 1.62 u[IU]/mL (ref 0.35–4.50)

## 2018-08-05 LAB — BASIC METABOLIC PANEL
BUN: 16 mg/dL (ref 6–23)
CO2: 25 meq/L (ref 19–32)
CREATININE: 0.9 mg/dL (ref 0.40–1.20)
Calcium: 10.2 mg/dL (ref 8.4–10.5)
Chloride: 102 mEq/L (ref 96–112)
GFR: 66.1 mL/min (ref >60.00)
GLUCOSE: 88 mg/dL (ref 70–99)
POTASSIUM: 4.7 meq/L (ref 3.5–5.1)
Sodium: 138 mEq/L (ref 135–145)

## 2018-08-05 LAB — T4, FREE: FREE T4: 1.53 ng/dL (ref 0.60–1.60)

## 2018-08-14 DIAGNOSIS — B965 Pseudomonas (aeruginosa) (mallei) (pseudomallei) as the cause of diseases classified elsewhere: Secondary | ICD-10-CM | POA: Diagnosis not present

## 2018-08-14 DIAGNOSIS — Z09 Encounter for follow-up examination after completed treatment for conditions other than malignant neoplasm: Secondary | ICD-10-CM | POA: Diagnosis not present

## 2018-08-14 DIAGNOSIS — N3010 Interstitial cystitis (chronic) without hematuria: Secondary | ICD-10-CM | POA: Diagnosis not present

## 2018-08-14 DIAGNOSIS — N3941 Urge incontinence: Secondary | ICD-10-CM | POA: Diagnosis not present

## 2018-08-14 DIAGNOSIS — N3090 Cystitis, unspecified without hematuria: Secondary | ICD-10-CM | POA: Diagnosis not present

## 2018-08-17 ENCOUNTER — Other Ambulatory Visit: Payer: Self-pay | Admitting: Family Medicine

## 2018-08-31 ENCOUNTER — Other Ambulatory Visit: Payer: Self-pay | Admitting: Family Medicine

## 2018-09-03 ENCOUNTER — Ambulatory Visit
Admission: RE | Admit: 2018-09-03 | Discharge: 2018-09-03 | Disposition: A | Discharge status: Home / Self Care | Payer: PPO | Source: Ambulatory Visit | Attending: Family Medicine | Admitting: Family Medicine

## 2018-09-03 DIAGNOSIS — Z1231 Encounter for screening mammogram for malignant neoplasm of breast: Secondary | ICD-10-CM | POA: Diagnosis not present

## 2018-09-03 DIAGNOSIS — Z1239 Encounter for other screening for malignant neoplasm of breast: Secondary | ICD-10-CM

## 2018-09-07 ENCOUNTER — Other Ambulatory Visit: Payer: Self-pay | Admitting: Family Medicine

## 2018-09-07 DIAGNOSIS — R928 Other abnormal and inconclusive findings on diagnostic imaging of breast: Secondary | ICD-10-CM

## 2018-09-11 ENCOUNTER — Ambulatory Visit
Admission: RE | Admit: 2018-09-11 | Discharge: 2018-09-11 | Disposition: A | Discharge status: Home / Self Care | Payer: PPO | Source: Ambulatory Visit | Attending: Family Medicine | Admitting: Family Medicine

## 2018-09-11 DIAGNOSIS — R928 Other abnormal and inconclusive findings on diagnostic imaging of breast: Secondary | ICD-10-CM | POA: Diagnosis not present

## 2018-09-11 DIAGNOSIS — N6002 Solitary cyst of left breast: Secondary | ICD-10-CM | POA: Diagnosis not present

## 2018-10-22 ENCOUNTER — Encounter: Payer: Self-pay | Admitting: Family Medicine

## 2018-10-22 ENCOUNTER — Ambulatory Visit (INDEPENDENT_AMBULATORY_CARE_PROVIDER_SITE_OTHER): Payer: PPO | Admitting: Family Medicine

## 2018-10-22 VITALS — BP 162/80 | HR 88 | Temp 98.8°F | Ht 62.0 in | Wt 241.0 lb

## 2018-10-22 DIAGNOSIS — E032 Hypothyroidism due to medicaments and other exogenous substances: Secondary | ICD-10-CM | POA: Diagnosis not present

## 2018-10-22 DIAGNOSIS — R635 Abnormal weight gain: Secondary | ICD-10-CM | POA: Diagnosis not present

## 2018-10-22 DIAGNOSIS — F319 Bipolar disorder, unspecified: Secondary | ICD-10-CM | POA: Diagnosis not present

## 2018-10-22 DIAGNOSIS — E2839 Other primary ovarian failure: Secondary | ICD-10-CM

## 2018-10-22 DIAGNOSIS — I10 Essential (primary) hypertension: Secondary | ICD-10-CM | POA: Diagnosis not present

## 2018-10-22 LAB — CBC WITH DIFFERENTIAL/PLATELET
BASOS ABS: 0.1 10*3/uL (ref 0.0–0.1)
Basophils Relative: 1.4 % (ref 0.0–3.0)
EOS PCT: 4.1 % (ref 0.0–5.0)
Eosinophils Absolute: 0.2 10*3/uL (ref 0.0–0.7)
HCT: 44.1 % (ref 36.0–46.0)
Hemoglobin: 15.3 g/dL — ABNORMAL HIGH (ref 12.0–15.0)
LYMPHS ABS: 1.2 10*3/uL (ref 0.7–4.0)
Lymphocytes Relative: 21.9 % (ref 12.0–46.0)
MCHC: 34.6 g/dL (ref 30.0–36.0)
MCV: 84.5 fl (ref 78.0–100.0)
MONO ABS: 0.5 10*3/uL (ref 0.1–1.0)
Monocytes Relative: 8.9 % (ref 3.0–12.0)
NEUTROS PCT: 63.7 % (ref 43.0–77.0)
Neutro Abs: 3.4 10*3/uL (ref 1.4–7.7)
PLATELETS: 226 10*3/uL (ref 150.0–400.0)
RBC: 5.22 Mil/uL — AB (ref 3.87–5.11)
RDW: 13.9 % (ref 11.5–15.5)
WBC: 5.3 10*3/uL (ref 4.0–10.5)

## 2018-10-22 LAB — COMPREHENSIVE METABOLIC PANEL
ALK PHOS: 98 U/L (ref 39–117)
ALT: 14 U/L (ref 0–35)
AST: 14 U/L (ref 0–37)
Albumin: 4.2 g/dL (ref 3.5–5.2)
BILIRUBIN TOTAL: 0.5 mg/dL (ref 0.2–1.2)
BUN: 25 mg/dL — AB (ref 6–23)
CO2: 29 meq/L (ref 19–32)
Calcium: 9.2 mg/dL (ref 8.4–10.5)
Chloride: 104 mEq/L (ref 96–112)
Creatinine, Ser: 0.88 mg/dL (ref 0.40–1.20)
GFR: 67.79 mL/min (ref >60.00)
GLUCOSE: 95 mg/dL (ref 70–99)
Potassium: 4.3 mEq/L (ref 3.5–5.1)
SODIUM: 142 meq/L (ref 135–145)
Total Protein: 7.2 g/dL (ref 6.0–8.3)

## 2018-10-22 LAB — LIPID PANEL
CHOL/HDL RATIO: 4
Cholesterol: 186 mg/dL (ref 0–200)
HDL: 51.4 mg/dL (ref >39.00)
LDL Cholesterol: 113 mg/dL — ABNORMAL HIGH (ref 0–99)
NONHDL: 134.43
Triglycerides: 107 mg/dL (ref 0.0–149.0)
VLDL: 21.4 mg/dL (ref 0.0–40.0)

## 2018-10-22 LAB — BRAIN NATRIURETIC PEPTIDE: Pro B Natriuretic peptide (BNP): 14 pg/mL (ref 0.0–100.0)

## 2018-10-22 LAB — T4, FREE: Free T4: 1.02 ng/dL (ref 0.60–1.60)

## 2018-10-22 LAB — TSH: TSH: 5.73 u[IU]/mL — AB (ref 0.35–4.50)

## 2018-10-22 MED ORDER — LEVOTHYROXINE SODIUM 112 MCG PO TABS: ORAL_TABLET | ORAL | 2 refills | Status: DC

## 2018-10-22 MED ORDER — DULOXETINE HCL 20 MG PO CPEP: 20.0000 mg | ORAL_CAPSULE | Freq: Every day | ORAL | 3 refills | Status: DC

## 2018-10-22 MED ORDER — LISINOPRIL 10 MG PO TABS: 10.0000 mg | ORAL_TABLET | Freq: Every day | ORAL | 3 refills | Status: DC

## 2018-10-22 NOTE — Patient Instructions (Addendum)
Great to see you. I will call you with your lab results from today and you can view them online.   We are starting Lisinopril 10 mg daily. Please come see me in 2 weeks.

## 2018-10-22 NOTE — Assessment & Plan Note (Signed)
Well controlled, followed by psychiatry.

## 2018-10-22 NOTE — Assessment & Plan Note (Signed)
New- remains high on repeat readings in office.  I suspect this is real since she has gained 20 pounds in a little over 2 months.  Likely due to excess calories-She feels she is more sedentary as she awaits her hip replacement and eating more, not less- but I cannot be sure there is not another cause to her HTN and rapid weight gain.  Will check additional labs today.  No red flag signs or symptoms. Start Lisinopril 10 mg daily- discussed possible side effects.  Follow up in 2 weeks. The patient indicates understanding of these issues and agrees with the plan.

## 2018-10-22 NOTE — Assessment & Plan Note (Signed)
New- likely due to excess calorie intake as she has become more sedentary but will check additional labs to rule out other potential contributing factors. Orders Placed This Encounter  Procedures  . DG Bone Density  . T4, free  . TSH  . T3  . B Nat Peptide  . Comprehensive metabolic panel  . Lipid panel  . CBC with Differential/Platelet

## 2018-10-22 NOTE — Progress Notes (Signed)
Subjective:   Patient ID: Amber Gutierrez, female    DOB: 02-21-1951, 67 y.o.   MRN: [id number]  Amber Gutierrez is a pleasant 67 y.o. year old female who presents to clinic today with Follow-up  on 10/22/2018  HPI:  HTN- new- has not had her blood pressure checked since she was last here but she has become more sedentary due to her hip pain (awaiting THA) and urinary issues.  Seh has gained 20 pounds since July.  She denies any CP or SOB.  Has intermittent LE edema but nothing significant. Has never been on antihypertensives.  Hypothyroidism- compliant with synthroid 112 mcg daily. Lab Results  Component Value Date   TSH 1.62 08/04/2018    Wt Readings from Last 3 Encounters:  10/22/18 241 lb (109.3 kg)  08/04/18 230 lb 9.6 oz (104.6 kg)  07/19/18 225 lb (102.1 kg)   Bipolar depression- followed by Dr. Toy Care, psychiatry.  Currently taking wellubtrin and cymbalta. Feels it under control.      Current Outpatient Medications on File Prior to Visit  Medication Sig Dispense Refill  . amitriptyline (ELAVIL) 25 MG tablet Take 50 mg by mouth at bedtime.  11  . B Complex Vitamins (VITAMIN B COMPLEX PO) Take 1 capsule by mouth daily.    . beta carotene w/minerals (OCUVITE) tablet Take 1 tablet by mouth daily.    Marland Kitchen buPROPion (WELLBUTRIN XL) 300 MG 24 hr tablet Take 300 mg by mouth every morning.     . Melatonin 10 MG TABS Take 10 mg by mouth at bedtime.     . Multiple Vitamin (MULTIVITAMIN WITH MINERALS) TABS Take 2 tablets by mouth daily.    . pentosan polysulfate (ELMIRON) 100 MG capsule Take 200 mg by mouth 2 (two) times daily.    Marland Kitchen tolterodine (DETROL LA) 4 MG 24 hr capsule Take 4 mg by mouth daily.  11   No current facility-administered medications on file prior to visit.     Allergies  Allergen Reactions  . Pravastatin Sodium Other (See Comments)     Muscle pain and weakness  . Sulfa Antibiotics Hives  . Tramadol Other (See Comments)    dizziness    Past Medical  History:  Diagnosis Date  . Allergic rhinitis   . Bipolar 2 disorder ([place])   . Bladder neoplasm   . Chronic cystitis   . DJD (degenerative joint disease) of lumbar spine    has seen Dr. Joya Salm  . History of basal cell carcinoma excision    2014  --forehead  . History of nephrolithiasis    lithotrypsy calcium stone  . History of radiation therapy    thyroid polyps  1992  . History of recurrent UTIs   . Other postablative hypothyroidism    HX  RAI X2    Past Surgical History:  Procedure Laterality Date  . [place]  . CHOLECYSTECTOMY  08/20/2012   Procedure: LAPAROSCOPIC CHOLECYSTECTOMY WITH INTRAOPERATIVE CHOLANGIOGRAM;  Surgeon: Gayland Curry, MD,FACS;  Location: [place];  Service: General;  Laterality: N/A;  . CYSTOSCOPY WITH BIOPSY N/A 08/27/2013   Procedure: CYSTOSCOPY WITH BLADDER BIOPSY/FULGRATION;  Surgeon: Fredricka Bonine, MD;  [facility];  Service: Urology;  Laterality: N/A;  . CYSTOSCOPY WITH BIOPSY N/A 06/20/2017   Procedure: CYSTOSCOPY WITH BIOPSY AND FULGURATION;  Surgeon: Festus Aloe, MD;  Location: [facility];  Service: Urology;  Laterality: N/A;  . EXTRACORPOREAL SHOCK WAVE LITHOTRIPSY  2010  . MASS EXCISION  08/20/2012   Procedure: EXCISION MASS;  Surgeon: Gayland Curry, MD,FACS;  Location: [place];  Service: General;  Laterality: N/A;  . TUBAL LIGATION  1976    Family History  Problem Relation Age of Onset  . Heart attack Father        1  deceased  . Alcohol abuse Father   . Breast cancer Sister   . Gallbladder disease Sister   . Bipolar disorder Mother   . Cancer Maternal Grandmother        colon    Social History   Socioeconomic History  . Marital status: Married    Spouse name: Not on file  . Number of children: Not on file  . Years of education: Not on file  . Highest education level: Not on file  Occupational History  . Not on file  Social Needs  . Financial resource  strain: Not on file  . Food insecurity:    Worry: Not on file    Inability: Not on file  . Transportation needs:    Medical: Not on file    Non-medical: Not on file  Tobacco Use  . Smoking status: Former Smoker    Packs/day: 0.50    Years: 45.00    Pack years: 22.50    Types: Cigarettes    Last attempt to quit: 06/29/2012    Years since quitting: 6.3  . Smokeless tobacco: Never Used  Substance and Sexual Activity  . Alcohol use: No  . Drug use: No  . Sexual activity: Never  Lifestyle  . Physical activity:    Days per week: Not on file    Minutes per session: Not on file  . Stress: Not on file  Relationships  . Social connections:    Talks on phone: Not on file    Gets together: Not on file    Attends religious service: Not on file    Active member of club or organization: Not on file    Attends meetings of clubs or organizations: Not on file    Relationship status: Not on file  . Intimate partner violence:    Fear of current or ex partner: Not on file    Emotionally abused: Not on file    Physically abused: Not on file    Forced sexual activity: Not on file  Other Topics Concern  . Not on file  Social History Narrative   Married husband relapsing and remitting  MS. 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  Review of Systems  Constitutional: Negative.   HENT: Negative.   Eyes: Negative.   Respiratory: Negative.   Cardiovascular: Negative.   Gastrointestinal: Negative.   Endocrine: Negative.   Genitourinary: Negative.   Musculoskeletal: Positive for arthralgias. Negative for back pain, gait problem, joint swelling, myalgias, neck pain and neck stiffness.  Neurological: Negative.   Hematological: Negative.     Psychiatric/Behavioral: Negative.   All other systems reviewed and are negative.      Objective:    BP (!) 162/80 (BP Location: Right Arm, Cuff Size: Normal)   Pulse 88   Temp 98.8 F (37.1 C) (Oral)   Ht 5\' 2"  (1.575 m)  Wt 241 lb (109.3 kg)   SpO2 98%   BMI 44.08 kg/m   BP Readings from Last 3 Encounters:  10/22/18 (!) 162/80  08/04/18 126/68  07/20/18 (!) 155/89    Physical Exam  Constitutional: She is oriented to person, place, and time. She appears well-developed and well-nourished. No distress.  HENT:  Head: Normocephalic and atraumatic.  Eyes: EOM are normal.  Neck: Normal range of motion. Neck supple. No thyromegaly present.  Cardiovascular: Normal rate and regular rhythm.  Pulmonary/Chest: Effort normal and breath sounds normal.  Musculoskeletal: She exhibits no edema.  Walking with walker  Neurological: She is alert and oriented to person, place, and time. No cranial nerve deficit.  Skin: Skin is warm and dry. She is not diaphoretic.  Psychiatric: She has a normal mood and affect. Her behavior is normal. Judgment and thought content normal.  Nursing note and vitals reviewed.         Assessment & Plan:   Hypothyroidism due to non-medication exogenous substances - Plan: T4, free, TSH, T3, Comprehensive metabolic panel, Lipid panel, CBC with Differential/Platelet  Estrogen deficiency - Plan: DG Bone Density  Hypertension, unspecified type  Weight gain - Plan: B Nat Peptide Return in about 2 weeks (around 11/05/2018) for blood pressure recheck., medication follow up.Marland Kitchen

## 2018-10-22 NOTE — Assessment & Plan Note (Signed)
Continue current rx but check additional labs given weight gain and BP elevation. The patient indicates understanding of these issues and agrees with the plan.

## 2018-10-23 LAB — T3: T3, Total: 115 ng/dL (ref 76–181)

## 2018-11-04 ENCOUNTER — Ambulatory Visit
Admission: RE | Admit: 2018-11-04 | Discharge: 2018-11-04 | Disposition: A | Discharge status: Home / Self Care | Payer: PPO | Source: Ambulatory Visit | Attending: Family Medicine | Admitting: Family Medicine

## 2018-11-04 DIAGNOSIS — M85851 Other specified disorders of bone density and structure, right thigh: Secondary | ICD-10-CM | POA: Diagnosis not present

## 2018-11-04 DIAGNOSIS — E2839 Other primary ovarian failure: Secondary | ICD-10-CM

## 2018-11-04 DIAGNOSIS — Z780 Asymptomatic menopausal state: Secondary | ICD-10-CM | POA: Diagnosis not present

## 2018-11-05 ENCOUNTER — Ambulatory Visit (INDEPENDENT_AMBULATORY_CARE_PROVIDER_SITE_OTHER): Payer: PPO | Admitting: Family Medicine

## 2018-11-05 ENCOUNTER — Encounter: Payer: Self-pay | Admitting: Family Medicine

## 2018-11-05 VITALS — BP 148/82 | HR 81 | Temp 99.0°F | Ht 62.0 in | Wt 241.0 lb

## 2018-11-05 DIAGNOSIS — I10 Essential (primary) hypertension: Secondary | ICD-10-CM | POA: Diagnosis not present

## 2018-11-05 MED ORDER — LOSARTAN POTASSIUM 25 MG PO TABS: 25.0000 mg | ORAL_TABLET | Freq: Every day | ORAL | 1 refills | Status: DC

## 2018-11-05 NOTE — Assessment & Plan Note (Signed)
Couldn't tolerate ACEI- added it to her allergy list. Start losartan 25 mg daily- I did tell her that there is a chance she could develop a cough with an ARB as well. She will keep me updated. Follow up in 2 weeks.

## 2018-11-05 NOTE — Patient Instructions (Signed)
Please start losartan 25 mg daily.  Contact me if you develop the cough again before your 2 week follow up appointment.

## 2018-11-05 NOTE — Progress Notes (Signed)
Subjective:   Patient ID: Amber Gutierrez, female    DOB: 1951-05-13, 67 y.o.   MRN: [id number]  Amber Gutierrez is a pleasant 67 y.o. year old female who presents to clinic today with Follow-up (She took Lisinopril for one week-experienced too many side effects-had a dry cough and laryngitis. )  on 11/05/2018  HPI:  HTN- we started lisinopril on 10/22/18 but she developed a dry cough and a scratchy throat so she stopped taking it. BP is better today but still elevated.   She is willing to try another rx.  Current Outpatient Medications on File Prior to Visit  Medication Sig Dispense Refill  . amitriptyline (ELAVIL) 25 MG tablet Take 50 mg by mouth at bedtime.  11  . B Complex Vitamins (VITAMIN B COMPLEX PO) Take 1 capsule by mouth daily.    . beta carotene w/minerals (OCUVITE) tablet Take 1 tablet by mouth daily.    Marland Kitchen buPROPion (WELLBUTRIN XL) 300 MG 24 hr tablet Take 300 mg by mouth every morning.     . DULoxetine (CYMBALTA) 20 MG capsule Take 1 capsule (20 mg total) by mouth daily.  3  . levothyroxine (SYNTHROID, LEVOTHROID) 112 MCG tablet TAKE ONE TABLET BY MOUTH EVERY MORNING WITH BREAKFAST. 90 tablet 2  . Melatonin 10 MG TABS Take 10 mg by mouth at bedtime.     . Multiple Vitamin (MULTIVITAMIN WITH MINERALS) TABS Take 2 tablets by mouth daily.    . pentosan polysulfate (ELMIRON) 100 MG capsule Take 200 mg by mouth 2 (two) times daily.    Marland Kitchen tolterodine (DETROL LA) 4 MG 24 hr capsule Take 4 mg by mouth daily.  11   No current facility-administered medications on file prior to visit.     Allergies  Allergen Reactions  . Pravastatin Sodium Other (See Comments)     Muscle pain and weakness  . Sulfa Antibiotics Hives  . Ace Inhibitors   . Tramadol Other (See Comments)    dizziness    Past Medical History:  Diagnosis Date  . Allergic rhinitis   . Bipolar 2 disorder ([place])   . Bladder neoplasm   . Chronic cystitis   . DJD (degenerative joint disease) of lumbar spine    has  seen Dr. Joya Salm  . History of basal cell carcinoma excision    2014  --forehead  . History of nephrolithiasis    lithotrypsy calcium stone  . History of radiation therapy    thyroid polyps  1992  . History of recurrent UTIs   . Other postablative hypothyroidism    HX  RAI X2    Past Surgical History:  Procedure Laterality Date  . [place]  . CHOLECYSTECTOMY  08/20/2012   Procedure: LAPAROSCOPIC CHOLECYSTECTOMY WITH INTRAOPERATIVE CHOLANGIOGRAM;  Surgeon: Gayland Curry, MD,FACS;  Location: [place];  Service: General;  Laterality: N/A;  . CYSTOSCOPY WITH BIOPSY N/A 08/27/2013   Procedure: CYSTOSCOPY WITH BLADDER BIOPSY/FULGRATION;  Surgeon: Fredricka Bonine, MD;  Location: [facility];  Service: Urology;  Laterality: N/A;  . CYSTOSCOPY WITH BIOPSY N/A 06/20/2017   Procedure: CYSTOSCOPY WITH BIOPSY AND FULGURATION;  Surgeon: Festus Aloe, MD;  Location: [facility];  Service: Urology;  Laterality: N/A;  . EXTRACORPOREAL SHOCK WAVE LITHOTRIPSY  2010  . MASS EXCISION  08/20/2012   Procedure: EXCISION MASS;  Surgeon: Gayland Curry, MD,FACS;  Location: [place];  Service: General;  Laterality: N/A;  . TUBAL LIGATION  1976    Family History  Problem Relation Age of Onset  . Heart attack Father        54  deceased  . Alcohol abuse Father   . Breast cancer Sister   . Gallbladder disease Sister   . Bipolar disorder Mother   . Cancer Maternal Grandmother        colon    Social History   Socioeconomic History  . Marital status: Married    Spouse name: Not on file  . Number of children: Not on file  . Years of education: Not on file  . Highest education level: Not on file  Occupational History  . Not on file  Social Needs  . Financial resource strain: Not on file  . Food insecurity:    Worry: Not on file    Inability: Not on file  . Transportation needs:    Medical: Not on file    Non-medical: Not on file  Tobacco Use    . Smoking status: Former Smoker    Packs/day: 0.50    Years: 45.00    Pack years: 22.50    Types: Cigarettes    Last attempt to quit: 06/29/2012    Years since quitting: 6.3  . Smokeless tobacco: Never Used  Substance and Sexual Activity  . Alcohol use: No  . Drug use: No  . Sexual activity: Never  Lifestyle  . Physical activity:    Days per week: Not on file    Minutes per session: Not on file  . Stress: Not on file  Relationships  . Social connections:    Talks on phone: Not on file    Gets together: Not on file    Attends religious service: Not on file    Active member of club or organization: Not on file    Attends meetings of clubs or organizations: Not on file    Relationship status: Not on file  . Intimate partner violence:    Fear of current or ex partner: Not on file    Emotionally abused: Not on file    Physically abused: Not on file    Forced sexual activity: Not on file  Other Topics Concern  . Not on file  Social History Narrative   Married husband relapsing and remitting  MS. 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     Review of Systems  Constitutional: Negative.   HENT: Positive for voice change.   Respiratory: Positive for cough. Negative for shortness of breath.   Cardiovascular: Negative.   Gastrointestinal: Negative.   Musculoskeletal: Negative.   Neurological: Negative.   Hematological: Negative.   Psychiatric/Behavioral: Negative.   All other systems reviewed and are negative.      Objective:    BP (!) 148/82   Pulse 81   Temp 99 F (37.2 C) (Oral)   Ht 5\' 2"  (1.575 m)   Wt 241 lb (109.3 kg)   SpO2 96%   BMI 44.08 kg/m   BP Readings from Last 3 Encounters:  11/05/18 (!) 148/82   10/22/18 (!) 162/80  08/04/18 126/68    Physical Exam  Constitutional: She is oriented to person, place, and time. She appears well-developed and well-nourished. No  distress.  HENT:  Head: Normocephalic and atraumatic.  Eyes: EOM are normal.  Neck: Normal range of motion. Neck supple.  Cardiovascular: Normal rate and regular rhythm.  Pulmonary/Chest: Effort normal and breath sounds normal.  Abdominal: Soft. Bowel sounds are normal.  Musculoskeletal: Normal range of motion.  Neurological: She is alert and oriented to person, place, and time. No cranial nerve deficit.  Skin: Skin is warm and dry. She is not diaphoretic.  Psychiatric: She has a normal mood and affect. Her behavior is normal. Judgment and thought content normal.  Nursing note and vitals reviewed.         Assessment & Plan:   No diagnosis found. Return in about 2 weeks (around 11/19/2018).

## 2018-11-05 NOTE — Addendum Note (Signed)
Addended by: Lucille Passy on: 11/05/2018 10:59 AM   Modules accepted: Level of Service

## 2018-11-16 DIAGNOSIS — G8929 Other chronic pain: Secondary | ICD-10-CM | POA: Diagnosis not present

## 2018-11-16 DIAGNOSIS — M1611 Unilateral primary osteoarthritis, right hip: Secondary | ICD-10-CM | POA: Diagnosis not present

## 2018-11-17 NOTE — Progress Notes (Signed)
Subjective:   Patient ID: Amber Gutierrez, female    DOB: August 27, 1951, 67 y.o.   MRN: [id number]  Amber Gutierrez is a pleasant 67 y.o. year old female who presents to clinic today with Hypertension (Doing well-cough has went away-tolerating Losartan daily)  on 11/18/2018  HPI:  Follow up-  Last saw her on 11/05/2018.  Note reviewed. On 10/22/18, she started lisinopril but she developed a dry cough and scratchy throat with it.  Therefore, I added it to her allergy list and started her on Losartan 25 mg daily on 11/05/18.  She is here to follow this up today. Dry has resolved.  Blood pressure is well controlled.  BP Readings from Last 3 Encounters:  11/18/18 128/76  11/05/18 (!) 148/82  10/22/18 (!) 162/80   Hypothyroidism- she is currently taking synthroid 112 mcg daily. TSH was mildly elevated on 10/22/18, T3 and FT4 were normal.  She was asymptomatic so we did not change her synthroid dose at that time.  Lab Results  Component Value Date   TSH 5.73 (H) 10/22/2018    Current Outpatient Medications on File Prior to Visit  Medication Sig Dispense Refill  . amitriptyline (ELAVIL) 25 MG tablet Take 50 mg by mouth at bedtime.  11  . B Complex Vitamins (VITAMIN B COMPLEX PO) Take 1 capsule by mouth daily.    . beta carotene w/minerals (OCUVITE) tablet Take 1 tablet by mouth daily.    Marland Kitchen buPROPion (WELLBUTRIN XL) 300 MG 24 hr tablet Take 300 mg by mouth every morning.     . DULoxetine (CYMBALTA) 20 MG capsule Take 1 capsule (20 mg total) by mouth daily.  3  . levothyroxine (SYNTHROID, LEVOTHROID) 112 MCG tablet TAKE ONE TABLET BY MOUTH EVERY MORNING WITH BREAKFAST. 90 tablet 2  . Melatonin 10 MG TABS Take 10 mg by mouth at bedtime.     . Multiple Vitamin (MULTIVITAMIN WITH MINERALS) TABS Take 2 tablets by mouth daily.    . pentosan polysulfate (ELMIRON) 100 MG capsule Take 200 mg by mouth 2 (two) times daily.    Marland Kitchen tolterodine (DETROL LA) 4 MG 24 hr capsule Take 4 mg by mouth daily.  11    No current facility-administered medications on file prior to visit.     Allergies  Allergen Reactions  . Pravastatin Sodium Other (See Comments)     Muscle pain and weakness  . Sulfa Antibiotics Hives  . Ace Inhibitors   . Tramadol Other (See Comments)    dizziness    Past Medical History:  Diagnosis Date  . Allergic rhinitis   . Bipolar 2 disorder ([place])   . Bladder neoplasm   . Chronic cystitis   . DJD (degenerative joint disease) of lumbar spine    has seen Dr. Joya Salm  . History of basal cell carcinoma excision    2014  --forehead  . History of nephrolithiasis    lithotrypsy calcium stone  . History of radiation therapy    thyroid polyps  1992  . History of recurrent UTIs   . Other postablative hypothyroidism    HX  RAI X2    Past Surgical History:  Procedure Laterality Date  . [place]  . CHOLECYSTECTOMY  08/20/2012   Procedure: LAPAROSCOPIC CHOLECYSTECTOMY WITH INTRAOPERATIVE CHOLANGIOGRAM;  Surgeon: Gayland Curry, MD,FACS;  Location: [place];  Service: General;  Laterality: N/A;  . CYSTOSCOPY WITH BIOPSY N/A 08/27/2013   Procedure: CYSTOSCOPY WITH BLADDER BIOPSY/FULGRATION;  Surgeon:  Fredricka Bonine, MD;  Location: [facility];  Service: Urology;  Laterality: N/A;  . CYSTOSCOPY WITH BIOPSY N/A 06/20/2017   Procedure: CYSTOSCOPY WITH BIOPSY AND FULGURATION;  Surgeon: Festus Aloe, MD;  Location[facility];  Service: Urology;  Laterality: N/A;  . EXTRACORPOREAL SHOCK WAVE LITHOTRIPSY  2010  . MASS EXCISION  08/20/2012   Procedure: EXCISION MASS;  Surgeon: Gayland Curry, MD,FACS;  Location: [place];  Service: General;  Laterality: N/A;  . TUBAL LIGATION  1976    Family History  Problem Relation Age of Onset  . Heart attack Father        68  deceased  . Alcohol abuse Father   . Breast cancer Sister   . Gallbladder disease Sister   . Bipolar disorder Mother   . Cancer Maternal Grandmother         colon    Social History   Socioeconomic History  . Marital status: Married    Spouse name: Not on file  . Number of children: Not on file  . Years of education: Not on file  . Highest education level: Not on file  Occupational History  . Not on file  Social Needs  . Financial resource strain: Not on file  . Food insecurity:    Worry: Not on file    Inability: Not on file  . Transportation needs:    Medical: Not on file    Non-medical: Not on file  Tobacco Use  . Smoking status: Former Smoker    Packs/day: 0.50    Years: 45.00    Pack years: 22.50    Types: Cigarettes    Last attempt to quit: 06/29/2012    Years since quitting: 6.3  . Smokeless tobacco: Never Used  Substance and Sexual Activity  . Alcohol use: No  . Drug use: No  . Sexual activity: Never  Lifestyle  . Physical activity:    Days per week: Not on file    Minutes per session: Not on file  . Stress: Not on file  Relationships  . Social connections:    Talks on phone: Not on file    Gets together: Not on file    Attends religious service: Not on file    Active member of club or organization: Not on file    Attends meetings of clubs or organizations: Not on file    Relationship status: Not on file  . Intimate partner violence:    Fear of current or ex partner: Not on file    Emotionally abused: Not on file    Physically abused: Not on file    Forced sexual activity: Not on file  Other Topics Concern  . Not on file  Social History Narrative   Married husband relapsing and remitting  MS. 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    Review of Systems  HENT: Negative.   Eyes: Negative.   Respiratory: Negative.   Cardiovascular: Negative.     Gastrointestinal: Negative.   Endocrine: Negative.   Genitourinary: Negative.   Musculoskeletal: Negative.   Allergic/Immunologic: Negative.   Neurological: Negative.   Hematological: Negative.   Psychiatric/Behavioral: Negative.   All other systems reviewed and  are negative.      Objective:    BP 128/76   Pulse 95   Temp 98.2 F (36.8 C) (Oral)   Ht 5\' 2"  (1.575 m)   Wt 241 lb (109.3 kg)   SpO2 95%   BMI 44.08 kg/m    Physical Exam   General:  Well-developed,well-nourished,in no acute distress; alert,appropriate and cooperative throughout examination Head:  normocephalic and atraumatic.   Eyes:  vision grossly intact, PERRL Ears:  R ear normal and L ear normal externally, TMs clear bilaterally Nose:  no external deformity.   Mouth:  good dentition.   Neck:  No deformities, masses, or tenderness noted. Lungs:  Normal respiratory effort, chest expands symmetrically. Lungs are clear to auscultation, no crackles or wheezes. Heart:  Normal rate and regular rhythm. S1 and S2 normal without gallop, murmur, click, rub or other extra sounds. Msk:  No deformity or scoliosis noted of thoracic or lumbar spine.   Extremities:  No clubbing, cyanosis, edema, or deformity noted with normal full range of motion of all joints.   Neurologic:  alert & oriented X3 and gait normal.   Skin:  Intact without suspicious lesions or rashes Psych:  Cognition and judgment appear intact. Alert and cooperative with normal attention span and concentration. No apparent delusions, illusions, hallucinations        Assessment & Plan:   Hypertension, unspecified type - Plan: Comprehensive metabolic panel  Hypothyroidism due to non-medication exogenous substances - Plan: TSH, T4, free  Cough No follow-ups on file.

## 2018-11-18 ENCOUNTER — Ambulatory Visit (INDEPENDENT_AMBULATORY_CARE_PROVIDER_SITE_OTHER): Payer: PPO | Admitting: Family Medicine

## 2018-11-18 ENCOUNTER — Encounter: Payer: Self-pay | Admitting: Family Medicine

## 2018-11-18 VITALS — BP 128/76 | HR 95 | Temp 98.2°F | Ht 62.0 in | Wt 241.0 lb

## 2018-11-18 DIAGNOSIS — E032 Hypothyroidism due to medicaments and other exogenous substances: Secondary | ICD-10-CM | POA: Diagnosis not present

## 2018-11-18 DIAGNOSIS — R05 Cough: Secondary | ICD-10-CM | POA: Diagnosis not present

## 2018-11-18 DIAGNOSIS — R059 Cough, unspecified: Secondary | ICD-10-CM | POA: Insufficient documentation

## 2018-11-18 DIAGNOSIS — I10 Essential (primary) hypertension: Secondary | ICD-10-CM

## 2018-11-18 LAB — COMPREHENSIVE METABOLIC PANEL
ALT: 13 U/L (ref 0–35)
AST: 14 U/L (ref 0–37)
Albumin: 4.2 g/dL (ref 3.5–5.2)
Alkaline Phosphatase: 93 U/L (ref 39–117)
BUN: 22 mg/dL (ref 6–23)
CALCIUM: 9.5 mg/dL (ref 8.4–10.5)
CO2: 26 meq/L (ref 19–32)
Chloride: 104 mEq/L (ref 96–112)
Creatinine, Ser: 0.83 mg/dL (ref 0.40–1.20)
GFR: 72.51 mL/min (ref >60.00)
Glucose, Bld: 100 mg/dL — ABNORMAL HIGH (ref 70–99)
Potassium: 4.2 mEq/L (ref 3.5–5.1)
SODIUM: 139 meq/L (ref 135–145)
Total Bilirubin: 0.5 mg/dL (ref 0.2–1.2)
Total Protein: 7 g/dL (ref 6.0–8.3)

## 2018-11-18 LAB — T4, FREE: FREE T4: 1.17 ng/dL (ref 0.60–1.60)

## 2018-11-18 LAB — TSH: TSH: 3.05 u[IU]/mL (ref 0.35–4.50)

## 2018-11-18 MED ORDER — LOSARTAN POTASSIUM 25 MG PO TABS: 25.0000 mg | ORAL_TABLET | Freq: Every day | ORAL | 3 refills | Status: DC

## 2018-11-18 NOTE — Assessment & Plan Note (Signed)
Well controlled on current dose of losartan. Continue current losartan 25 mg daily-eRx refills sent. She will keep me updated.

## 2018-11-18 NOTE — Assessment & Plan Note (Signed)
TSH mildly elevated about a month ago. Asymptomatic- repeat thyroid function today.

## 2018-11-18 NOTE — Patient Instructions (Signed)
Great to see you. I will call you with your lab results from today and you can view them online.  Continue losartan 25 mg daily.  Please keep me updated.

## 2018-11-18 NOTE — Assessment & Plan Note (Signed)
Resolved with stopping ACEI- added to allergy list.

## 2018-12-01 DIAGNOSIS — Z87440 Personal history of urinary (tract) infections: Secondary | ICD-10-CM | POA: Diagnosis not present

## 2018-12-01 DIAGNOSIS — M1611 Unilateral primary osteoarthritis, right hip: Secondary | ICD-10-CM | POA: Diagnosis not present

## 2019-01-12 DIAGNOSIS — N3010 Interstitial cystitis (chronic) without hematuria: Secondary | ICD-10-CM | POA: Diagnosis not present

## 2019-01-12 DIAGNOSIS — N3090 Cystitis, unspecified without hematuria: Secondary | ICD-10-CM | POA: Diagnosis not present

## 2019-02-10 DIAGNOSIS — Z87440 Personal history of urinary (tract) infections: Secondary | ICD-10-CM | POA: Diagnosis not present

## 2019-02-10 DIAGNOSIS — N3010 Interstitial cystitis (chronic) without hematuria: Secondary | ICD-10-CM | POA: Diagnosis not present

## 2019-02-10 DIAGNOSIS — B965 Pseudomonas (aeruginosa) (mallei) (pseudomallei) as the cause of diseases classified elsewhere: Secondary | ICD-10-CM | POA: Diagnosis not present

## 2019-02-10 DIAGNOSIS — N3090 Cystitis, unspecified without hematuria: Secondary | ICD-10-CM | POA: Diagnosis not present

## 2019-02-10 DIAGNOSIS — Z87442 Personal history of urinary calculi: Secondary | ICD-10-CM | POA: Diagnosis not present

## 2019-02-19 DIAGNOSIS — N202 Calculus of kidney with calculus of ureter: Secondary | ICD-10-CM | POA: Diagnosis not present

## 2019-03-05 DIAGNOSIS — M25751 Osteophyte, right hip: Secondary | ICD-10-CM | POA: Diagnosis not present

## 2019-03-05 DIAGNOSIS — M1611 Unilateral primary osteoarthritis, right hip: Secondary | ICD-10-CM | POA: Diagnosis not present

## 2019-03-19 IMAGING — US US RENAL
1 series · 14 of 25 positions shown · non-contrast
Comparison: CT on 08/14/2017

CLINICAL DATA: Urinary tract infection.  Nephrolithiasis.

EXAM:
RENAL / URINARY TRACT ULTRASOUND COMPLETE

[Series 1: us renal · 0.28mm/px · 14 of 27 slices shown]
[im 1/27]
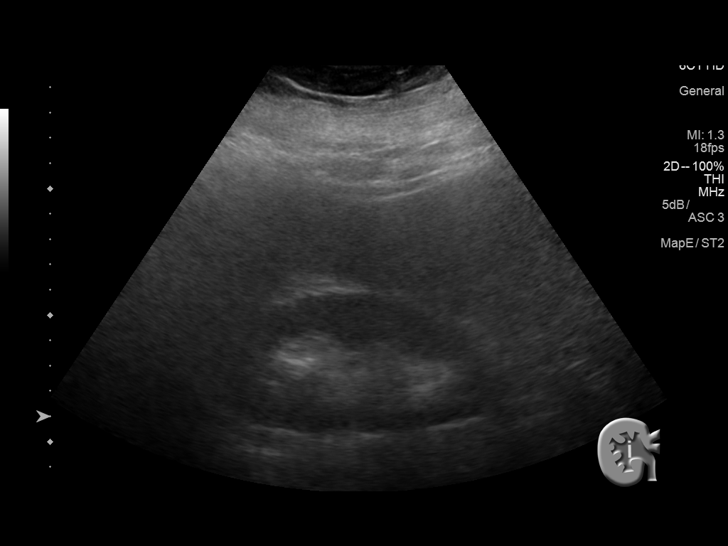
[im 3/27]
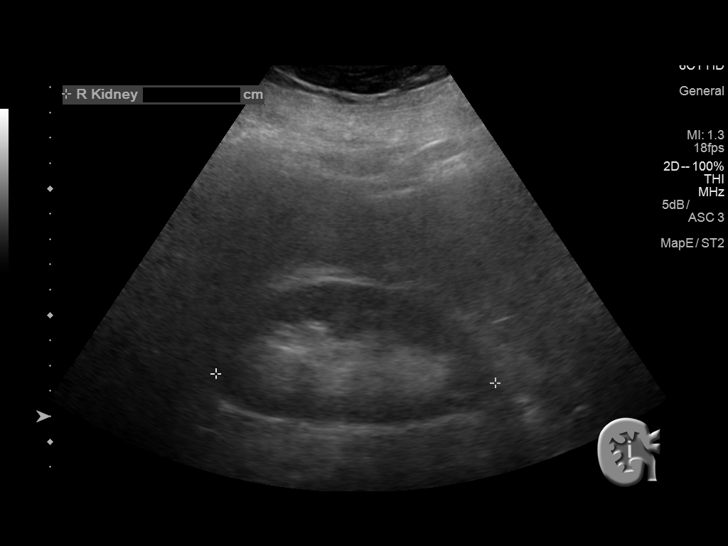
[im 5/27]
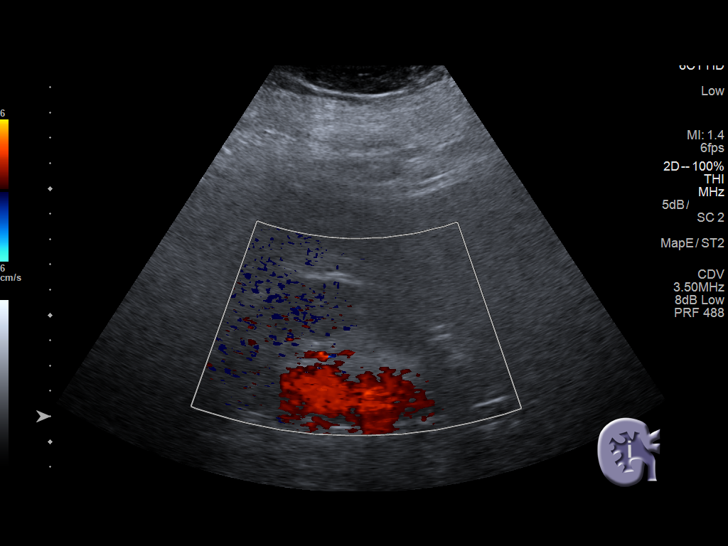
[im 7/27]
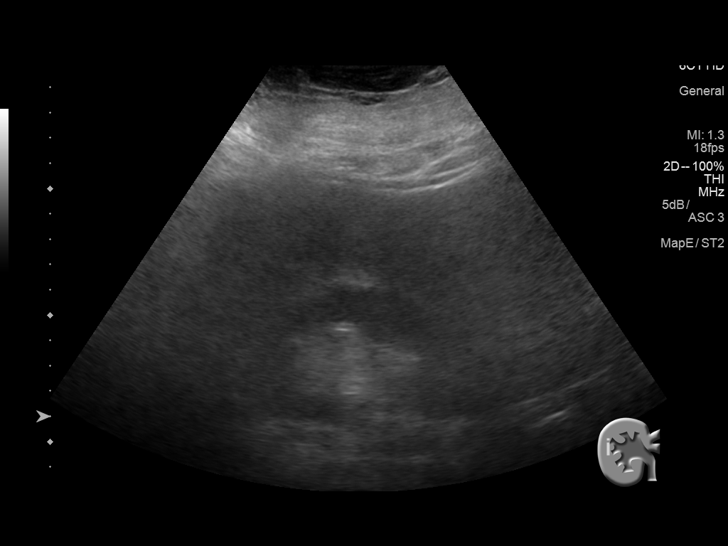
[im 9/27]
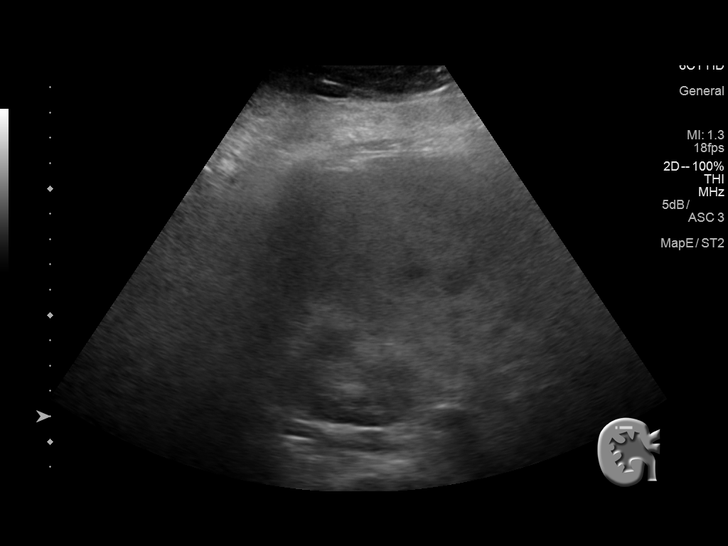
[im 10/27]
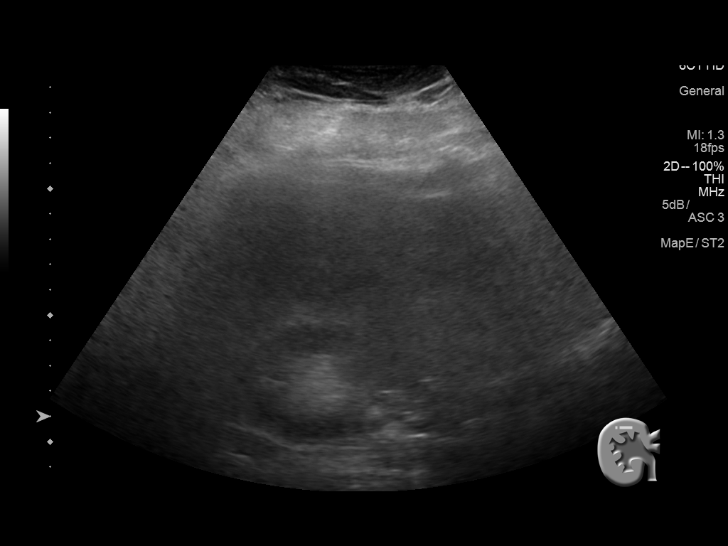
[im 12/27]
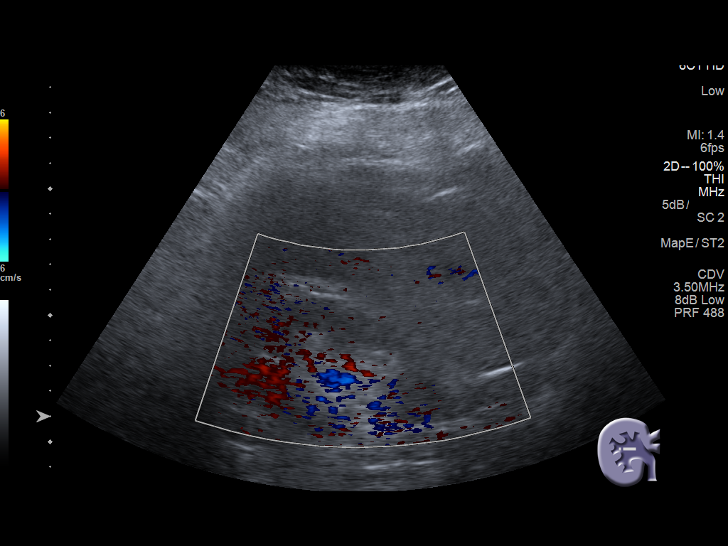
[im 15/27]
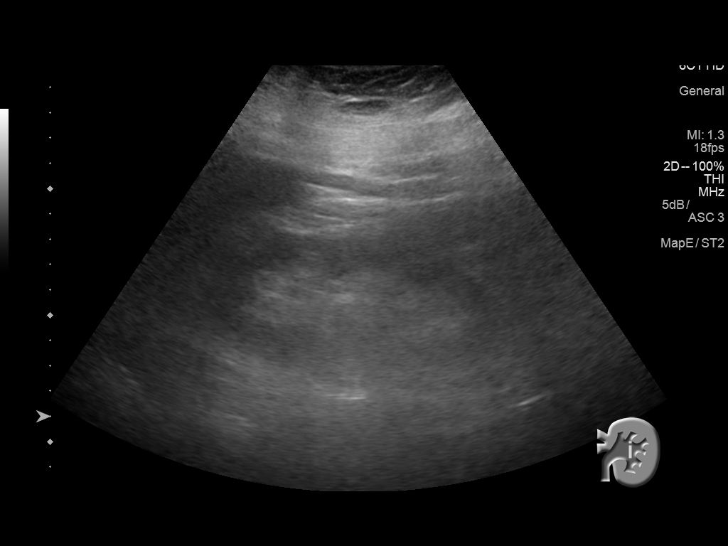
[im 17/27]
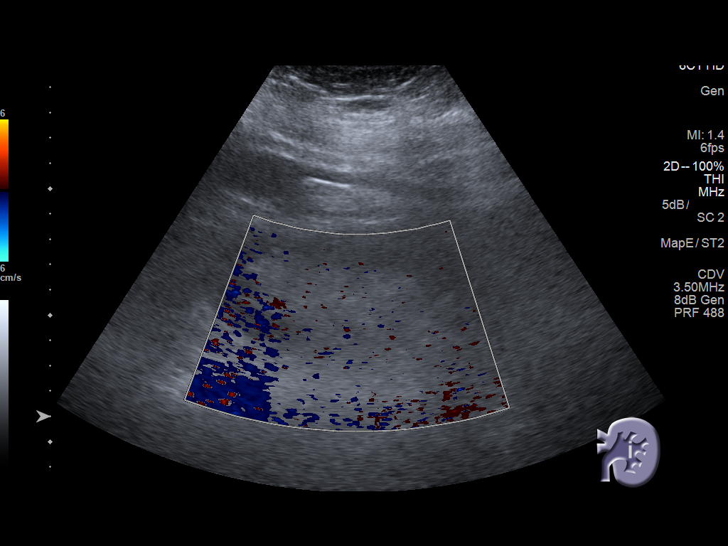
[im 18/27]
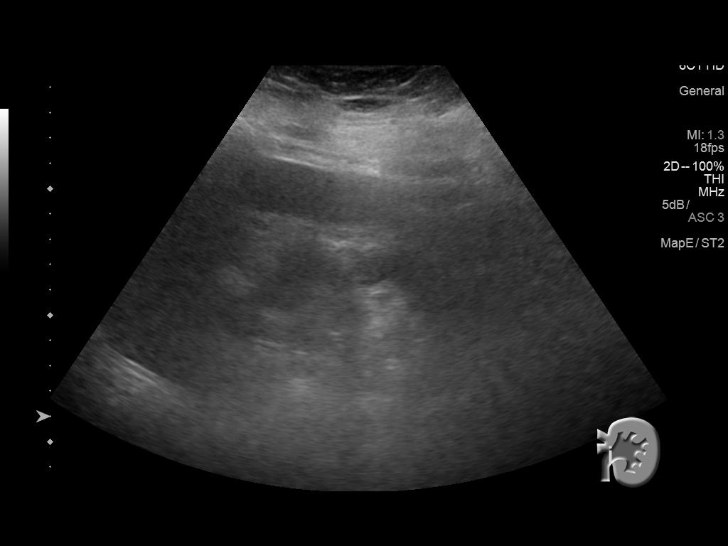
[im 20/27]
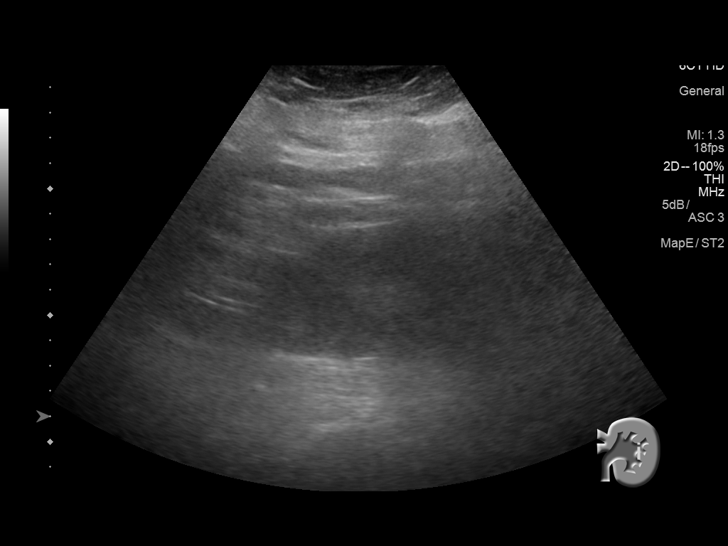
[im 22/27]
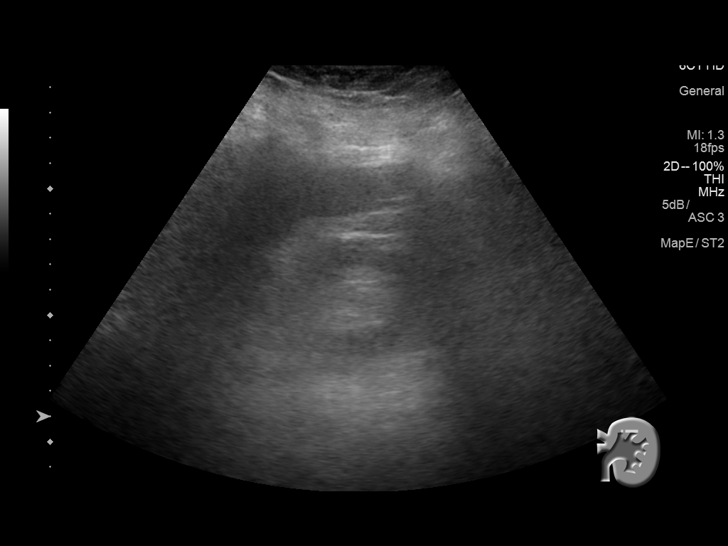
[im 24/27]
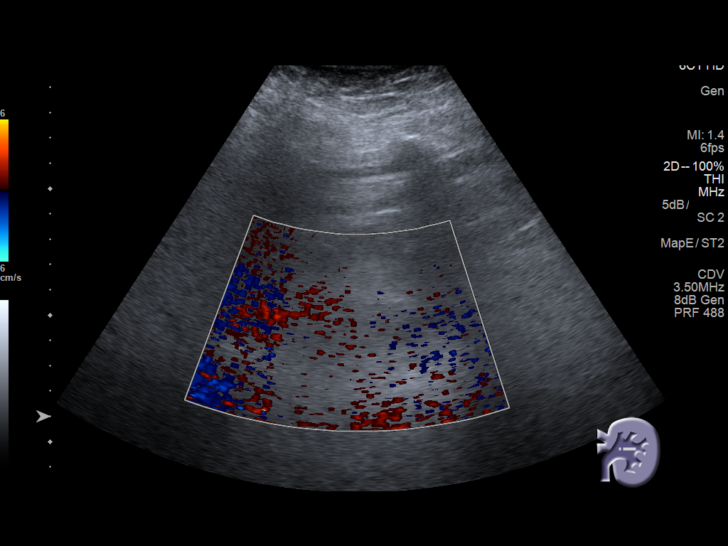
[im 27/27]
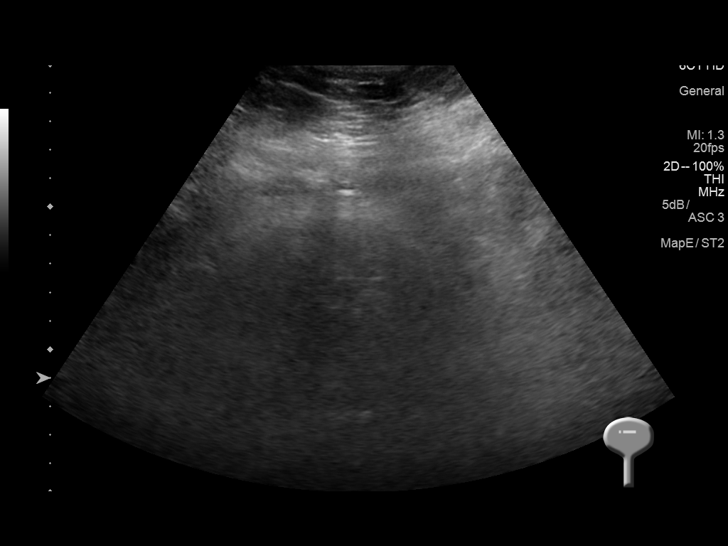

[14 of 25 positions shown; findings below may reference images not displayed]

FINDINGS: Right Kidney:

Length: 11.0 cm. Echogenicity within normal limits. No mass or
hydronephrosis visualized.

Left Kidney:

Length: 11.6 cm. Suboptimal visualization due to large to patient
habitus. Echogenicity within normal limits. No mass or
hydronephrosis visualized.

Bladder:

Empty with Foley catheter in place.
IMPRESSION: No evidence of hydronephrosis.

## 2019-04-21 ENCOUNTER — Ambulatory Visit: Payer: PPO | Admitting: *Deleted

## 2019-05-26 ENCOUNTER — Ambulatory Visit (INDEPENDENT_AMBULATORY_CARE_PROVIDER_SITE_OTHER)
Admission: RE | Admit: 2019-05-26 | Discharge: 2019-05-26 | Disposition: A | Discharge status: Home / Self Care | Payer: PPO | Source: Ambulatory Visit

## 2019-05-26 ENCOUNTER — Inpatient Hospital Stay: Admit: 2019-05-26 | Discharge: 2019-05-26 | Disposition: A | Discharge status: Home / Self Care | Payer: PPO

## 2019-05-26 ENCOUNTER — Telehealth: Payer: PPO

## 2019-05-26 DIAGNOSIS — R1030 Lower abdominal pain, unspecified: Secondary | ICD-10-CM | POA: Diagnosis not present

## 2019-05-26 DIAGNOSIS — K5900 Constipation, unspecified: Secondary | ICD-10-CM

## 2019-05-26 MED ORDER — AMOXICILLIN-POT CLAVULANATE 875-125 MG PO TABS: 1.0000 | ORAL_TABLET | Freq: Three times a day (TID) | ORAL | 0 refills | Status: AC

## 2019-05-26 MED ORDER — POLYETHYLENE GLYCOL 3350 17 GM/SCOOP PO POWD: 17.0000 g | Freq: Every day | ORAL | 0 refills | Status: DC

## 2019-05-26 NOTE — Discharge Instructions (Addendum)
Miralax, may take twice today, approximately 8 hours between doses. Drink plenty of water with this and following.  Complete course of antibiotics for concern for diverticulitis.  If worsening or persistent pain, persistent or worsening of fevers, still no bowel movement, vomiting, weakness please come to Urgent Care or even go to the ER for further evaluation.

## 2019-05-26 NOTE — ED Provider Notes (Signed)
Virtual Visit via Video Note:  Amber Gutierrez  initiated request for Telemedicine visit with [facility] team. I connected with Mosie Epstein  on 05/26/2019 at 11:58 AM  for a synchronized telemedicine visit using a video enabled HIPPA compliant telemedicine application. I verified that I am speaking with Mosie Epstein  using two identifiers. Zigmund Gottron, NP  was physically located in a [facility] Urgent care site and AISLING EMIGH was located at a different location.   The limitations of evaluation and management by telemedicine as well as the availability of in-person appointments were discussed. Patient was informed that she  may incur a bill ( including co-pay) for this virtual visit encounter. Mosie Epstein  expressed understanding and gave verbal consent to proceed with virtual visit.     History of Present Illness:Unknown Amber Gutierrez  is a 68 y.o. female presents with complaints of constipation past weekend- took colace two days which has not helped pass a BM. "Annoying" sharp pains to low abdomen. Last [facility] Sunday 5/24. Decreased intake. Low appetite, hurts less if doesn't eat. Typically has a BM daily. Has had intermittent issue with constipation in the past, colace usually helps however. Feels like diverticulitis she has had in the past, last episodes was years ago. Temp of 100.3, yesterday afternoon. Motrin and tylenol helps with fever. Hx of chronic cystitis- denies any similar symptoms to this. Mild nausea, intermittent. No vomiting. No increased pain with movement/activity. No abdominal surgeries in the past. Colonoscopy last 5 years ago- polyps burned out. She was recommended to have repeat in 3 years which she has not done. No known diverticulosis. Her appendix was removed with a cesarean section in the past.    Past Medical History:  Diagnosis Date  . Allergic rhinitis   . Bipolar 2 disorder ([place])   . Bladder neoplasm   . Chronic cystitis   . DJD  (degenerative joint disease) of lumbar spine    has seen Dr. Joya Salm  . History of basal cell carcinoma excision    2014  --forehead  . History of nephrolithiasis    lithotrypsy calcium stone  . History of radiation therapy    thyroid polyps  1992  . History of recurrent UTIs   . Other postablative hypothyroidism    HX  RAI X2    Allergies  Allergen Reactions  . Pravastatin Sodium Other (See Comments)     Muscle pain and weakness  . Sulfa Antibiotics Hives  . Ace Inhibitors   . Tramadol Other (See Comments)    dizziness        Observations/Objective: Alert, interactive, non toxic in appearance. Speaking clearly without apparent distress. Skin without apparent diaphoresis.   Assessment and Plan: Constipation vs diverticulitis vs abscess vs obstruction vs nephrolithiasis vs cystitis considered. Discussed differentials with patient. Low grade temps which arent typical of constipation, feels similar to diverticulitis she has had in the past. Patient agreeable to starting antibiotics while also starting miralax to promote bm with very strict return/er precautions. Patient verbalized understanding and agreeable to plan.    Follow Up Instructions:    I discussed the assessment and treatment plan with the patient. The patient was provided an opportunity to ask questions and all were answered. The patient agreed with the plan and demonstrated an understanding of the instructions.   The patient was advised to call back or seek an in-person evaluation if the symptoms worsen or if the condition fails to improve  as anticipated.  I provided 16 minutes of non-face-to-face time during this encounter.    Zigmund Gottron, NP  05/26/2019 11:58 AM         Zigmund Gottron, NP 05/26/19 1200

## 2019-07-13 DIAGNOSIS — N3010 Interstitial cystitis (chronic) without hematuria: Secondary | ICD-10-CM | POA: Diagnosis not present

## 2019-07-13 DIAGNOSIS — N3090 Cystitis, unspecified without hematuria: Secondary | ICD-10-CM | POA: Diagnosis not present

## 2019-07-19 ENCOUNTER — Encounter: Payer: Self-pay | Admitting: Family Medicine

## 2019-07-20 ENCOUNTER — Other Ambulatory Visit: Payer: Self-pay

## 2019-07-20 ENCOUNTER — Other Ambulatory Visit: Payer: Self-pay | Admitting: Family Medicine

## 2019-07-20 DIAGNOSIS — Z87891 Personal history of nicotine dependence: Secondary | ICD-10-CM

## 2019-07-20 MED ORDER — LEVOTHYROXINE SODIUM 112 MCG PO TABS: ORAL_TABLET | ORAL | 1 refills | Status: DC

## 2019-07-28 DIAGNOSIS — Z789 Other specified health status: Secondary | ICD-10-CM | POA: Diagnosis not present

## 2019-07-28 DIAGNOSIS — Z471 Aftercare following joint replacement surgery: Secondary | ICD-10-CM | POA: Diagnosis not present

## 2019-07-28 DIAGNOSIS — M1611 Unilateral primary osteoarthritis, right hip: Secondary | ICD-10-CM | POA: Diagnosis not present

## 2019-07-28 DIAGNOSIS — M25559 Pain in unspecified hip: Secondary | ICD-10-CM | POA: Diagnosis not present

## 2019-07-28 DIAGNOSIS — R262 Difficulty in walking, not elsewhere classified: Secondary | ICD-10-CM | POA: Diagnosis not present

## 2019-07-28 DIAGNOSIS — Z96641 Presence of right artificial hip joint: Secondary | ICD-10-CM | POA: Diagnosis not present

## 2019-07-28 DIAGNOSIS — Z7409 Other reduced mobility: Secondary | ICD-10-CM | POA: Diagnosis not present

## 2019-08-04 DIAGNOSIS — Z01812 Encounter for preprocedural laboratory examination: Secondary | ICD-10-CM | POA: Diagnosis not present

## 2019-08-04 DIAGNOSIS — M1611 Unilateral primary osteoarthritis, right hip: Secondary | ICD-10-CM | POA: Diagnosis not present

## 2019-08-04 DIAGNOSIS — Z1159 Encounter for screening for other viral diseases: Secondary | ICD-10-CM | POA: Diagnosis not present

## 2019-08-04 DIAGNOSIS — Z01818 Encounter for other preprocedural examination: Secondary | ICD-10-CM | POA: Diagnosis not present

## 2019-08-17 DIAGNOSIS — Z20828 Contact with and (suspected) exposure to other viral communicable diseases: Secondary | ICD-10-CM | POA: Diagnosis not present

## 2019-08-17 DIAGNOSIS — Z01812 Encounter for preprocedural laboratory examination: Secondary | ICD-10-CM | POA: Diagnosis not present

## 2019-08-17 DIAGNOSIS — M1611 Unilateral primary osteoarthritis, right hip: Secondary | ICD-10-CM | POA: Diagnosis not present

## 2019-08-17 NOTE — Progress Notes (Signed)
Virtual Visit via Video Note  I connected with patient on 08/18/19 at 11:00 AM EDT by a video enabled telemedicine application and verified that I am speaking with the correct person using two identifiers.   THIS ENCOUNTER IS A VIRTUAL VISIT DUE TO COVID-19 - PATIENT WAS NOT SEEN IN THE OFFICE. PATIENT HAS CONSENTED TO VIRTUAL VISIT / TELEMEDICINE VISIT   Location of patient: home  Location of provider: office  I discussed the limitations of evaluation and management by telemedicine and the availability of in person appointments. The patient expressed understanding and agreed to proceed.   Subjective:   Amber Gutierrez is a 68 y.o. female who presents for Medicare Annual (Subsequent) preventive examination.  Pt scheduled to have hip replacement on Right by Dr.Pollock on Monday.  Review of Systems:  Cardiac Risk Factors include: advanced age (>67men, >47 women);dyslipidemia;hypertension;obesity (BMI >30kg/m2)Home Safety/Smoke Alarms: Feels safe in home. Smoke alarms in place.  Lives in 1 story home with husband. Walk in shower with bench. Been using walker past 4 months.  Female:   Mammo- 09/11/18      Dexa scan-  11/04/18      CCS-06/29/10 reported by pt as normal     Objective:     Advanced Directives 08/18/2019 07/19/2018 06/20/2017 08/07/2016 08/27/2013 08/14/2012  Does Patient Have a Medical Advance Directive? Yes Yes Yes Yes Patient has advance directive, copy not in chart Patient has advance directive, copy not in chart  Type of Advance Directive [place];Living will Living will [place];Living will [place];Living will [place];Living will Living will  Does patient want to make changes to medical advance directive? No - Patient declined No - Patient declined No - Patient declined No - Patient declined - -  Copy of [place] in Chart? No - copy requested - No - copy requested No - copy  requested Copy requested from family Copy requested from family  Pre-existing out of facility DNR order (yellow form or pink MOST form) - - - - No -    Tobacco Social History   Tobacco Use  Smoking Status Former Smoker  . Packs/day: 0.50  . Years: 45.00  . Pack years: 22.50  . Types: Cigarettes  . Quit date: 06/29/2012  . Years since quitting: 7.1  Smokeless Tobacco Never Used     Counseling given: Not Answered   Clinical Intake:     Pain : No/denies pain                 Past Medical History:  Diagnosis Date  . Allergic rhinitis   . Bipolar 2 disorder ([place])   . Bladder neoplasm   . Chronic cystitis   . DJD (degenerative joint disease) of lumbar spine    has seen Dr. Joya Salm  . History of basal cell carcinoma excision    2014  --forehead  . History of nephrolithiasis    lithotrypsy calcium stone  . History of radiation therapy    thyroid polyps  1992  . History of recurrent UTIs   . Other postablative hypothyroidism    HX  RAI X2   Past Surgical History:  Procedure Laterality Date  . [place]  . CHOLECYSTECTOMY  08/20/2012   Procedure: LAPAROSCOPIC CHOLECYSTECTOMY WITH INTRAOPERATIVE CHOLANGIOGRAM;  Surgeon: Gayland Curry, MD,FACS;  Location: [place];  Service: General;  Laterality: N/A;  . CYSTOSCOPY WITH BIOPSY N/A 08/27/2013  Procedure: CYSTOSCOPY WITH BLADDER BIOPSY/FULGRATION;  Surgeon: Fredricka Bonine, MD;  Location[facility];  Service: Urology;  Laterality: N/A;  . CYSTOSCOPY WITH BIOPSY N/A 06/20/2017   Procedure: CYSTOSCOPY WITH BIOPSY AND FULGURATION;  Surgeon: Festus Aloe, MD;  Location: [facility];  Service: Urology;  Laterality: N/A;  . EXTRACORPOREAL SHOCK WAVE LITHOTRIPSY  2010  . MASS EXCISION  08/20/2012   Procedure: EXCISION MASS;  Surgeon: Gayland Curry, MD,FACS;  Location: [place];  Service: General;  Laterality: N/A;  . TUBAL LIGATION  1976   Family History  Problem  Relation Age of Onset  . Heart attack Father        75  deceased  . Alcohol abuse Father   . Breast cancer Sister   . Gallbladder disease Sister   . Bipolar disorder Mother   . Cancer Maternal Grandmother        colon   Social History   Socioeconomic History  . Marital status: Married    Spouse name: Not on file  . Number of children: Not on file  . Years of education: Not on file  . Highest education level: Not on file  Occupational History  . Not on file  Social Needs  . Financial resource strain: Not on file  . Food insecurity    Worry: Not on file    Inability: Not on file  . Transportation needs    Medical: Not on file    Non-medical: Not on file  Tobacco Use  . Smoking status: Former Smoker    Packs/day: 0.50    Years: 45.00    Pack years: 22.50    Types: Cigarettes    Quit date: 06/29/2012    Years since quitting: 7.1  . Smokeless tobacco: Never Used  Substance and Sexual Activity  . Alcohol use: No  . Drug use: No  . Sexual activity: Never  Lifestyle  . Physical activity    Days per week: Not on file    Minutes per session: Not on file  . Stress: Not on file  Relationships  . Social Herbalist on phone: Not on file    Gets together: Not on file    Attends religious service: Not on file    Active member of club or organization: Not on file    Attends meetings of clubs or organizations: Not on file    Relationship status: Not on file  Other Topics Concern  . Not on file  Social History Narrative   Married husband relapsing and remitting  MS. 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 amitriptyline (ELAVIL) 25 MG tablet Take 50 mg by mouth at bedtime.  . B Complex Vitamins (VITAMIN B COMPLEX PO) Take 1 capsule by mouth daily.  . beta carotene w/minerals  (OCUVITE) tablet Take 1 tablet by mouth daily.  Marland Kitchen buPROPion (WELLBUTRIN XL) 300 MG 24 hr tablet Take 300 mg by mouth every morning.   Marland Kitchen levothyroxine (SYNTHROID) 112 MCG tablet TAKE ONE TABLET BY MOUTH EVERY MORNING WITH BREAKFAST.  Marland Kitchen losartan (COZAAR) 25 MG tablet Take 1 tablet (25 mg total) by mouth daily.  . Melatonin 10 MG TABS Take 10 mg by mouth at bedtime.   . Multiple Vitamin (MULTIVITAMIN  WITH MINERALS) TABS Take 2 tablets by mouth daily.  . pentosan polysulfate (ELMIRON) 100 MG capsule Take 200 mg by mouth 2 (two) times daily.  . polyethylene glycol powder (GLYCOLAX/MIRALAX) 17 GM/SCOOP powder Take 17 g by mouth daily.  Marland Kitchen tolterodine (DETROL LA) 4 MG 24 hr capsule Take 4 mg by mouth daily.  . [DISCONTINUED] DULoxetine (CYMBALTA) 20 MG capsule Take 1 capsule (20 mg total) by mouth daily.   No facility-administered encounter medications on file as of 08/18/2019.     Activities of Daily Living In your present state of health, do you have any difficulty performing the following activities: 08/18/2019  Hearing? N  Vision? N  Difficulty concentrating or making decisions? N  Walking or climbing stairs? Y  Dressing or bathing? Y  Comment uses shower chair and extension gripper  Doing errands, shopping? Y  Preparing Food and eating ? N  Using the Toilet? N  In the past six months, have you accidently leaked urine? Y  Do you have problems with loss of bowel control? N  Managing your Medications? N  Managing your Finances? N  Housekeeping or managing your Housekeeping? Y  Some recent data might be hidden    Patient Care Team: Lucille Passy, MD as PCP - General (Family Medicine) Orion Crook, MD (Inactive) (Urology) Juanita Craver, MD (Gastroenterology) Megan Salon, MD (Gynecology) Druscilla Brownie, MD (Dermatology) Festus Aloe, MD as Consulting Physician (Urology) Chucky May, MD as Consulting Physician (Psychiatry) Lucille Passy, MD as Consulting Physician  (Family Medicine) Christene Lye, MD (General Surgery)    Assessment:   This is a routine wellness examination for Amber Gutierrez. Physical assessment deferred to PCP.  Exercise Activities and Dietary recommendations Current Exercise Habits: The patient does not participate in regular exercise at present, Exercise limited by: orthopedic condition(s)   Diet (meal preparation, eat out, water intake, caffeinated beverages, dairy products, fruits and vegetables): in general, a "healthy" diet  , well balanced   Goals    . Increase physical activity     Starting 08/07/16, I will continue to exercise for at least 30 min 3-4 days per week.        Fall Risk Fall Risk  08/18/2019 10/22/2018 09/02/2017 08/07/2016  Falls in the past year? 0 No No Yes  Comment - - - tripped over rug in house  Number falls in past yr: - - - 1  Injury with Fall? - - - No  Follow up - - - Falls evaluation completed;Falls prevention discussed     Depression Screen PHQ 2/9 Scores 08/18/2019 10/22/2018 09/02/2017 08/07/2016  PHQ - 2 Score 0 0 0 0     Cognitive Function Ad8 score reviewed for issues:  Issues making decisions:no  Less interest in hobbies / activities:no  Repeats questions, stories (family complaining):no  Trouble using ordinary gadgets (microwave, computer, phone):no  Forgets the month or year: no  Mismanaging finances: no  Remembering appts:no  Daily problems with thinking and/or memory:no Ad8 score is=0     MMSE - Mini Mental State Exam 08/07/2016  Orientation to time 5  Orientation to Place 5  Registration 3  Attention/ Calculation 0  Recall 3  Language- name 2 objects 0  Language- repeat 1  Language- follow 3 step command 3  Language- read & follow direction 0  Write a sentence 0  Copy design 0  Total score 20        Immunization History  Administered Date(s) Administered  . Influenza Whole  11/13/2010  . Influenza-Unspecified 12/11/2017  . Pneumococcal Conjugate-13  08/07/2015  . Pneumococcal Polysaccharide-23 08/07/2016  . Td 12/30/2004, 08/07/2015  . Zoster 09/12/2016   Screening Tests Health Maintenance  Topic Date Due  . COLONOSCOPY  06/29/2020  . MAMMOGRAM  09/03/2020  . TETANUS/TDAP  08/06/2025  . DEXA SCAN  Completed  . Hepatitis C Screening  Completed  . PNA vac Low Risk Adult  Completed  . INFLUENZA VACCINE  Discontinued       Plan:   See you next year!  Continue to eat heart healthy diet (full of fruits, vegetables, whole grains, lean protein, water--limit salt, fat, and sugar intake) and increase physical activity as tolerated.  Continue doing brain stimulating activities (puzzles, reading, adult coloring books, staying active) to keep memory sharp.   Bring a copy of your living will and/or healthcare power of attorney to your next office visit.    I have personally reviewed and noted the following in the patient's chart:   . Medical and social history . Use of alcohol, tobacco or illicit drugs  . Current medications and supplements . Functional ability and status . Nutritional status . Physical activity . Advanced directives . List of other physicians . Hospitalizations, surgeries, and ER visits in previous 12 months . Vitals . Screenings to include cognitive, depression, and falls . Referrals and appointments  In addition, I have reviewed and discussed with patient certain preventive protocols, quality metrics, and best practice recommendations. A written personalized care plan for preventive services as well as general preventive health recommendations were provided to patient.     Shela Nevin, [place]  08/18/2019

## 2019-08-18 ENCOUNTER — Encounter: Payer: Self-pay | Admitting: *Deleted

## 2019-08-18 ENCOUNTER — Ambulatory Visit (INDEPENDENT_AMBULATORY_CARE_PROVIDER_SITE_OTHER): Payer: PPO | Admitting: *Deleted

## 2019-08-18 VITALS — BP 133/78

## 2019-08-18 DIAGNOSIS — Z0000 Encounter for general adult medical examination without abnormal findings: Secondary | ICD-10-CM | POA: Diagnosis not present

## 2019-08-18 NOTE — Patient Instructions (Signed)
See you next year!  Continue to eat heart healthy diet (full of fruits, vegetables, whole grains, lean protein, water--limit salt, fat, and sugar intake) and increase physical activity as tolerated.  Continue doing brain stimulating activities (puzzles, reading, adult coloring books, staying active) to keep memory sharp.   Bring a copy of your living will and/or healthcare power of attorney to your next office visit.   Amber Gutierrez , Thank you for taking time to come for your Medicare Wellness Visit. I appreciate your ongoing commitment to your health goals. Please review the following plan we discussed and let me know if I can assist you in the future.   These are the goals we discussed: Goals    . Increase physical activity     Starting 08/07/16, I will continue to exercise for at least 30 min 3-4 days per week.        This is a list of the screening recommended for you and due dates:  Health Maintenance  Topic Date Due  . Colon Cancer Screening  06/29/2020  . Mammogram  09/03/2020  . Tetanus Vaccine  08/06/2025  . DEXA scan (bone density measurement)  Completed  .  Hepatitis C: One time screening is recommended by Center for Disease Control  (CDC) for  adults born from [birth year] through 1966.   Completed  . Pneumonia vaccines  Completed  . Flu Shot  Discontinued    Health Maintenance After Age 31 After age 15, you are at a higher risk for certain long-term diseases and infections as well as injuries from falls. Falls are a major cause of broken bones and head injuries in people who are older than age 31. Getting regular preventive care can help to keep you healthy and well. Preventive care includes getting regular testing and making lifestyle changes as recommended by your health care provider. Talk with your health care provider about:  Which screenings and tests you should have. A screening is a test that checks for a disease when you have no symptoms.  A diet and exercise plan that is  right for you. What should I know about screenings and tests to prevent falls? Screening and testing are the best ways to find a health problem early. Early diagnosis and treatment give you the best chance of managing medical conditions that are common after age 32. Certain conditions and lifestyle choices may make you more likely to have a fall. Your health care provider may recommend:  Regular vision checks. Poor vision and conditions such as cataracts can make you more likely to have a fall. If you wear glasses, make sure to get your prescription updated if your vision changes.  Medicine review. Work with your health care provider to regularly review all of the medicines you are taking, including over-the-counter medicines. Ask your health care provider about any side effects that may make you more likely to have a fall. Tell your health care provider if any medicines that you take make you feel dizzy or sleepy.  Osteoporosis screening. Osteoporosis is a condition that causes the bones to get weaker. This can make the bones weak and cause them to break more easily.  Blood pressure screening. Blood pressure changes and medicines to control blood pressure can make you feel dizzy.  Strength and balance checks. Your health care provider may recommend certain tests to check your strength and balance while standing, walking, or changing positions.  Foot health exam. Foot pain and numbness, as well as not wearing  proper footwear, can make you more likely to have a fall.  Depression screening. You may be more likely to have a fall if you have a fear of falling, feel emotionally low, or feel unable to do activities that you used to do.  Alcohol use screening. Using too much alcohol can affect your balance and may make you more likely to have a fall. What actions can I take to lower my risk of falls? General instructions  Talk with your health care provider about your risks for falling. Tell your  health care provider if: ? You fall. Be sure to tell your health care provider about all falls, even ones that seem minor. ? You feel dizzy, sleepy, or off-balance.  Take over-the-counter and prescription medicines only as told by your health care provider. These include any supplements.  Eat a healthy diet and maintain a healthy weight. A healthy diet includes low-fat dairy products, low-fat (lean) meats, and fiber from whole grains, beans, and lots of fruits and vegetables. Home safety  Remove any tripping hazards, such as rugs, cords, and clutter.  Install safety equipment such as grab bars in bathrooms and safety rails on stairs.  Keep rooms and walkways well-lit. Activity   Follow a regular exercise program to stay fit. This will help you maintain your balance. Ask your health care provider what types of exercise are appropriate for you.  If you need a cane or walker, use it as recommended by your health care provider.  Wear supportive shoes that have nonskid soles. Lifestyle  Do not drink alcohol if your health care provider tells you not to drink.  If you drink alcohol, limit how much you have: ? 0-1 drink a day for women. ? 0-2 drinks a day for men.  Be aware of how much alcohol is in your drink. In the [place] one drink equals one typical bottle of beer (12 oz), one-half glass of wine (5 oz), or one shot of hard liquor (1 oz).  Do not use any products that contain nicotine or tobacco, such as cigarettes and e-cigarettes. If you need help quitting, ask your health care provider. Summary  Having a healthy lifestyle and getting preventive care can help to protect your health and wellness after age 44.  Screening and testing are the best way to find a health problem early and help you avoid having a fall. Early diagnosis and treatment give you the best chance for managing medical conditions that are more common for people who are older than age 34.  Falls are a major cause  of broken bones and head injuries in people who are older than age 71. Take precautions to prevent a fall at home.  Work with your health care provider to learn what changes you can make to improve your health and wellness and to prevent falls. This information is not intended to replace advice given to you by your health care provider. Make sure you discuss any questions you have with your health care provider. Document Released: 10/29/2017 Document Revised: 04/08/2019 Document Reviewed: 10/29/2017 Elsevier Patient Education  2020 [employer].

## 2019-08-26 ENCOUNTER — Telehealth: Payer: PPO | Admitting: Physician Assistant

## 2019-08-26 DIAGNOSIS — B9789 Other viral agents as the cause of diseases classified elsewhere: Secondary | ICD-10-CM | POA: Diagnosis not present

## 2019-08-26 DIAGNOSIS — J329 Chronic sinusitis, unspecified: Secondary | ICD-10-CM

## 2019-08-26 MED ORDER — FLUTICASONE PROPIONATE 50 MCG/ACT NA SUSP: 2.0000 | Freq: Every day | NASAL | 0 refills | Status: DC

## 2019-08-26 NOTE — Progress Notes (Signed)
We are sorry that you are not feeling well.  Here is how we plan to help!  Based on what you have shared with me it looks like you have sinusitis.  Sinusitis is inflammation and infection in the sinus cavities of the head.  Based on your presentation I believe you most likely have Acute Viral Sinusitis.This is an infection most likely caused by a virus.  There is not specific treatment for viral sinusitis other than to help you with the symptoms until the infection runs its course, which typically takes 7-10 days.  You may use an oral decongestant such as Mucinex D or if you have glaucoma or high blood pressure use plain Mucinex. Saline nasal spray help and can safely be used as often as needed for congestion, I have prescribed: Fluticasone nasal spray two sprays in each nostril once a day  Some authorities believe that zinc sprays or the use of Echinacea may shorten the course of your symptoms.  Sinus infections are not as easily transmitted as other respiratory infection, however we still recommend that you avoid close contact with loved ones, especially the very young and elderly.  Remember to wash your hands thoroughly throughout the day as this is the number one way to prevent the spread of infection!  If you are not feeling any better with all of this symptomatic treatment in the next 5-7 days, please contact us as you may warrant an antibiotic at that time. If symptoms become severe or you develop new concerning symptoms, please seek face to face evaluation.   Home Care:  Only take medications as instructed by your medical team.  Do not take these medications with alcohol.  A steam or ultrasonic humidifier can help congestion.  You can place a towel over your head and breathe in the steam from hot water coming from a faucet.  Avoid close contacts especially the very young and the elderly.  Cover your mouth when you cough or sneeze.  Always remember to wash your hands.  Get Help Right  Away If:  You develop worsening fever or sinus pain.  You develop a severe head ache or visual changes.  Your symptoms persist after you have completed your treatment plan.  Make sure you  Understand these instructions.  Will watch your condition.  Will get help right away if you are not doing well or get worse.  Your e-visit answers were reviewed by a board certified advanced clinical practitioner to complete your personal care plan.  Depending on the condition, your plan could have included both over the counter or prescription medications.  If there is a problem please reply  once you have received a response from your provider.  Your safety is important to [place].  If you have drug allergies check your prescription carefully.    You can use MyChart to ask questions about today's visit, request a non-urgent call back, or ask for a work or school excuse for 24 hours related to this e-Visit. If it has been greater than 24 hours you will need to follow up with your provider, or enter a new e-Visit to address those concerns.  You will get an e-mail in the next two days asking about your experience.  I hope that your e-visit has been valuable and will speed your recovery. Thank you for using e-visits.  I have spent 5 minutes in review of e-visit questionnaire, review and updating patient chart, medical decision making and response to patient.  Tenna Delaine, PA-C

## 2019-09-01 DIAGNOSIS — Z01812 Encounter for preprocedural laboratory examination: Secondary | ICD-10-CM | POA: Diagnosis not present

## 2019-09-01 DIAGNOSIS — Z20828 Contact with and (suspected) exposure to other viral communicable diseases: Secondary | ICD-10-CM | POA: Diagnosis not present

## 2019-09-01 DIAGNOSIS — M1611 Unilateral primary osteoarthritis, right hip: Secondary | ICD-10-CM | POA: Diagnosis not present

## 2019-09-07 DIAGNOSIS — F419 Anxiety disorder, unspecified: Secondary | ICD-10-CM | POA: Diagnosis not present

## 2019-09-07 DIAGNOSIS — I129 Hypertensive chronic kidney disease with stage 1 through stage 4 chronic kidney disease, or unspecified chronic kidney disease: Secondary | ICD-10-CM | POA: Diagnosis not present

## 2019-09-07 DIAGNOSIS — Z885 Allergy status to narcotic agent status: Secondary | ICD-10-CM | POA: Diagnosis not present

## 2019-09-07 DIAGNOSIS — Z471 Aftercare following joint replacement surgery: Secondary | ICD-10-CM | POA: Diagnosis not present

## 2019-09-07 DIAGNOSIS — F319 Bipolar disorder, unspecified: Secondary | ICD-10-CM | POA: Diagnosis not present

## 2019-09-07 DIAGNOSIS — G4733 Obstructive sleep apnea (adult) (pediatric): Secondary | ICD-10-CM | POA: Diagnosis not present

## 2019-09-07 DIAGNOSIS — G8918 Other acute postprocedural pain: Secondary | ICD-10-CM | POA: Diagnosis not present

## 2019-09-07 DIAGNOSIS — Z79899 Other long term (current) drug therapy: Secondary | ICD-10-CM | POA: Diagnosis not present

## 2019-09-07 DIAGNOSIS — N3010 Interstitial cystitis (chronic) without hematuria: Secondary | ICD-10-CM | POA: Diagnosis not present

## 2019-09-07 DIAGNOSIS — G8929 Other chronic pain: Secondary | ICD-10-CM | POA: Diagnosis not present

## 2019-09-07 DIAGNOSIS — Z9049 Acquired absence of other specified parts of digestive tract: Secondary | ICD-10-CM | POA: Diagnosis not present

## 2019-09-07 DIAGNOSIS — E785 Hyperlipidemia, unspecified: Secondary | ICD-10-CM | POA: Diagnosis not present

## 2019-09-07 DIAGNOSIS — I10 Essential (primary) hypertension: Secondary | ICD-10-CM | POA: Diagnosis not present

## 2019-09-07 DIAGNOSIS — M1611 Unilateral primary osteoarthritis, right hip: Secondary | ICD-10-CM | POA: Diagnosis not present

## 2019-09-07 DIAGNOSIS — Z886 Allergy status to analgesic agent status: Secondary | ICD-10-CM | POA: Diagnosis not present

## 2019-09-07 DIAGNOSIS — E669 Obesity, unspecified: Secondary | ICD-10-CM | POA: Diagnosis not present

## 2019-09-07 DIAGNOSIS — Z888 Allergy status to other drugs, medicaments and biological substances status: Secondary | ICD-10-CM | POA: Diagnosis not present

## 2019-09-07 DIAGNOSIS — Z87891 Personal history of nicotine dependence: Secondary | ICD-10-CM | POA: Diagnosis not present

## 2019-09-07 DIAGNOSIS — Z79890 Hormone replacement therapy (postmenopausal): Secondary | ICD-10-CM | POA: Diagnosis not present

## 2019-09-07 DIAGNOSIS — E039 Hypothyroidism, unspecified: Secondary | ICD-10-CM | POA: Diagnosis not present

## 2019-09-07 DIAGNOSIS — Z882 Allergy status to sulfonamides status: Secondary | ICD-10-CM | POA: Diagnosis not present

## 2019-09-07 DIAGNOSIS — Z96641 Presence of right artificial hip joint: Secondary | ICD-10-CM | POA: Diagnosis not present

## 2019-09-07 DIAGNOSIS — M25551 Pain in right hip: Secondary | ICD-10-CM | POA: Diagnosis not present

## 2019-09-07 DIAGNOSIS — N189 Chronic kidney disease, unspecified: Secondary | ICD-10-CM | POA: Diagnosis not present

## 2019-09-07 DIAGNOSIS — Z6841 Body Mass Index (BMI) 40.0 and over, adult: Secondary | ICD-10-CM | POA: Diagnosis not present

## 2019-09-09 DIAGNOSIS — E785 Hyperlipidemia, unspecified: Secondary | ICD-10-CM | POA: Diagnosis not present

## 2019-09-09 DIAGNOSIS — Z87891 Personal history of nicotine dependence: Secondary | ICD-10-CM | POA: Diagnosis not present

## 2019-09-09 DIAGNOSIS — E669 Obesity, unspecified: Secondary | ICD-10-CM | POA: Diagnosis not present

## 2019-09-09 DIAGNOSIS — I69351 Hemiplegia and hemiparesis following cerebral infarction affecting right dominant side: Secondary | ICD-10-CM | POA: Diagnosis not present

## 2019-09-09 DIAGNOSIS — I509 Heart failure, unspecified: Secondary | ICD-10-CM | POA: Diagnosis not present

## 2019-09-09 DIAGNOSIS — G8929 Other chronic pain: Secondary | ICD-10-CM | POA: Diagnosis not present

## 2019-09-09 DIAGNOSIS — Z955 Presence of coronary angioplasty implant and graft: Secondary | ICD-10-CM | POA: Diagnosis not present

## 2019-09-09 DIAGNOSIS — G4733 Obstructive sleep apnea (adult) (pediatric): Secondary | ICD-10-CM | POA: Diagnosis not present

## 2019-09-09 DIAGNOSIS — M810 Age-related osteoporosis without current pathological fracture: Secondary | ICD-10-CM | POA: Diagnosis not present

## 2019-09-09 DIAGNOSIS — I2510 Atherosclerotic heart disease of native coronary artery without angina pectoris: Secondary | ICD-10-CM | POA: Diagnosis not present

## 2019-09-09 DIAGNOSIS — F419 Anxiety disorder, unspecified: Secondary | ICD-10-CM | POA: Diagnosis not present

## 2019-09-09 DIAGNOSIS — Z87442 Personal history of urinary calculi: Secondary | ICD-10-CM | POA: Diagnosis not present

## 2019-09-09 DIAGNOSIS — D631 Anemia in chronic kidney disease: Secondary | ICD-10-CM | POA: Diagnosis not present

## 2019-09-09 DIAGNOSIS — Z471 Aftercare following joint replacement surgery: Secondary | ICD-10-CM | POA: Diagnosis not present

## 2019-09-09 DIAGNOSIS — Z96641 Presence of right artificial hip joint: Secondary | ICD-10-CM | POA: Diagnosis not present

## 2019-09-09 DIAGNOSIS — N189 Chronic kidney disease, unspecified: Secondary | ICD-10-CM | POA: Diagnosis not present

## 2019-09-09 DIAGNOSIS — F319 Bipolar disorder, unspecified: Secondary | ICD-10-CM | POA: Diagnosis not present

## 2019-09-09 DIAGNOSIS — E039 Hypothyroidism, unspecified: Secondary | ICD-10-CM | POA: Diagnosis not present

## 2019-09-09 DIAGNOSIS — G20 Parkinson's disease: Secondary | ICD-10-CM | POA: Diagnosis not present

## 2019-09-09 DIAGNOSIS — I130 Hypertensive heart and chronic kidney disease with heart failure and stage 1 through stage 4 chronic kidney disease, or unspecified chronic kidney disease: Secondary | ICD-10-CM | POA: Diagnosis not present

## 2019-09-09 DIAGNOSIS — Z6841 Body Mass Index (BMI) 40.0 and over, adult: Secondary | ICD-10-CM | POA: Diagnosis not present

## 2019-09-09 DIAGNOSIS — Z9181 History of falling: Secondary | ICD-10-CM | POA: Diagnosis not present

## 2019-09-09 DIAGNOSIS — Z87440 Personal history of urinary (tract) infections: Secondary | ICD-10-CM | POA: Diagnosis not present

## 2019-09-24 DIAGNOSIS — Z471 Aftercare following joint replacement surgery: Secondary | ICD-10-CM | POA: Diagnosis not present

## 2019-09-24 DIAGNOSIS — Z96641 Presence of right artificial hip joint: Secondary | ICD-10-CM | POA: Diagnosis not present

## 2019-09-27 DIAGNOSIS — M1611 Unilateral primary osteoarthritis, right hip: Secondary | ICD-10-CM | POA: Diagnosis not present

## 2019-09-27 DIAGNOSIS — M25559 Pain in unspecified hip: Secondary | ICD-10-CM | POA: Diagnosis not present

## 2019-09-27 DIAGNOSIS — R262 Difficulty in walking, not elsewhere classified: Secondary | ICD-10-CM | POA: Diagnosis not present

## 2019-09-27 DIAGNOSIS — Z789 Other specified health status: Secondary | ICD-10-CM | POA: Diagnosis not present

## 2019-09-27 DIAGNOSIS — Z471 Aftercare following joint replacement surgery: Secondary | ICD-10-CM | POA: Diagnosis not present

## 2019-09-27 DIAGNOSIS — Z7409 Other reduced mobility: Secondary | ICD-10-CM | POA: Diagnosis not present

## 2019-09-27 DIAGNOSIS — Z96641 Presence of right artificial hip joint: Secondary | ICD-10-CM | POA: Diagnosis not present

## 2019-09-29 DIAGNOSIS — Z789 Other specified health status: Secondary | ICD-10-CM | POA: Diagnosis not present

## 2019-09-29 DIAGNOSIS — M1611 Unilateral primary osteoarthritis, right hip: Secondary | ICD-10-CM | POA: Diagnosis not present

## 2019-09-29 DIAGNOSIS — Z96641 Presence of right artificial hip joint: Secondary | ICD-10-CM | POA: Diagnosis not present

## 2019-09-29 DIAGNOSIS — Z471 Aftercare following joint replacement surgery: Secondary | ICD-10-CM | POA: Diagnosis not present

## 2019-09-29 DIAGNOSIS — Z7409 Other reduced mobility: Secondary | ICD-10-CM | POA: Diagnosis not present

## 2019-09-29 DIAGNOSIS — M25559 Pain in unspecified hip: Secondary | ICD-10-CM | POA: Diagnosis not present

## 2019-09-29 DIAGNOSIS — R262 Difficulty in walking, not elsewhere classified: Secondary | ICD-10-CM | POA: Diagnosis not present

## 2019-10-03 IMAGING — MG DIGITAL SCREENING BILATERAL MAMMOGRAM WITH TOMO AND CAD
8 series · 8 of 24 positions shown · non-contrast
Comparison: Previous exam(s).

CLINICAL DATA: Screening.

EXAM:
DIGITAL SCREENING BILATERAL MAMMOGRAM WITH TOMO AND CAD

[R MLO synth-2D]
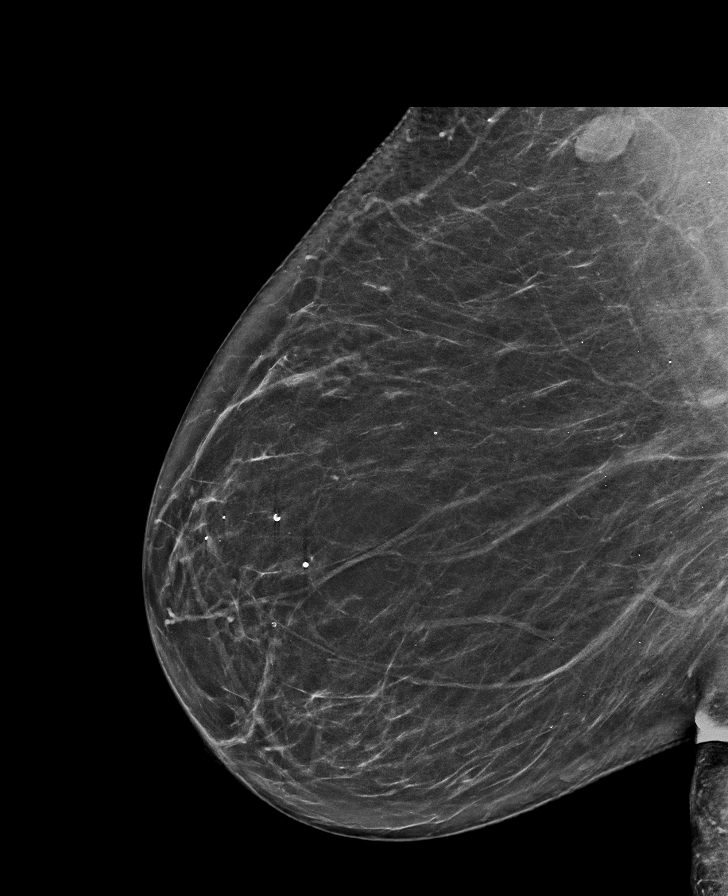

[L MLO synth-2D]
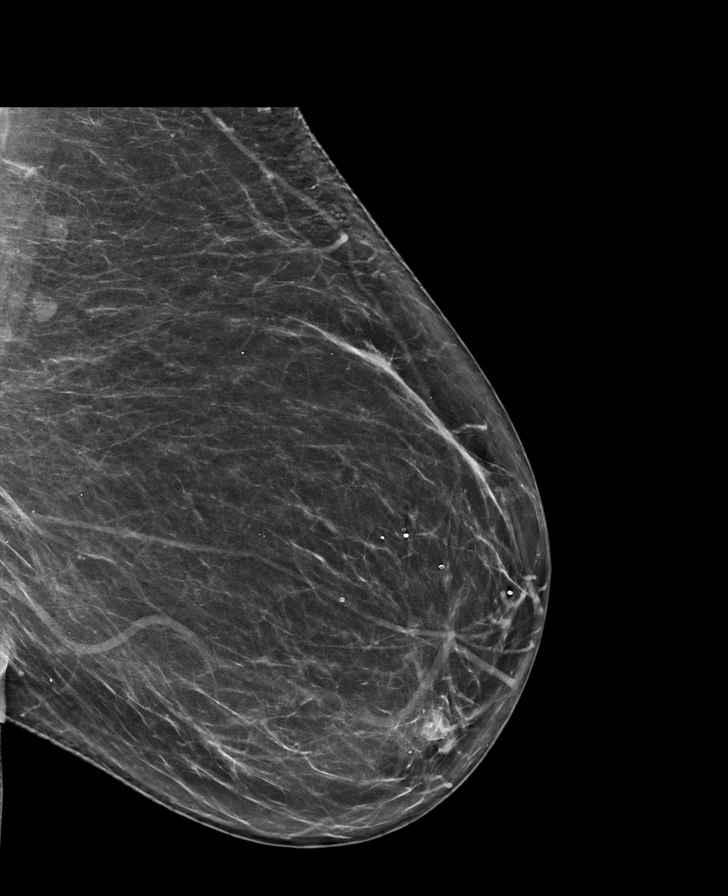

[R CC synth-2D]
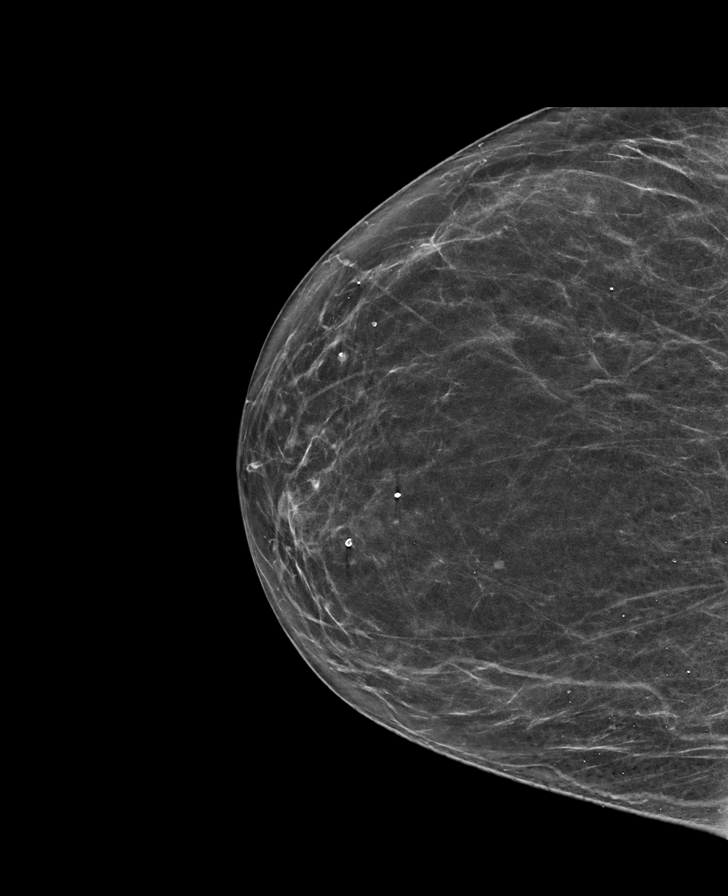

[L CC synth-2D]
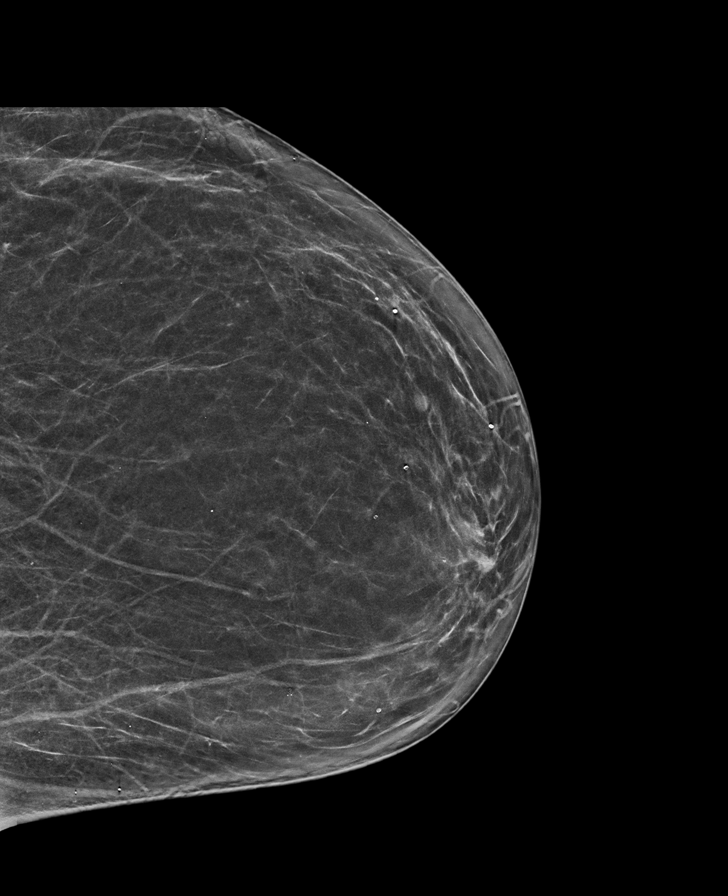

[R MLO tomo · tomo slice 41/82.0]
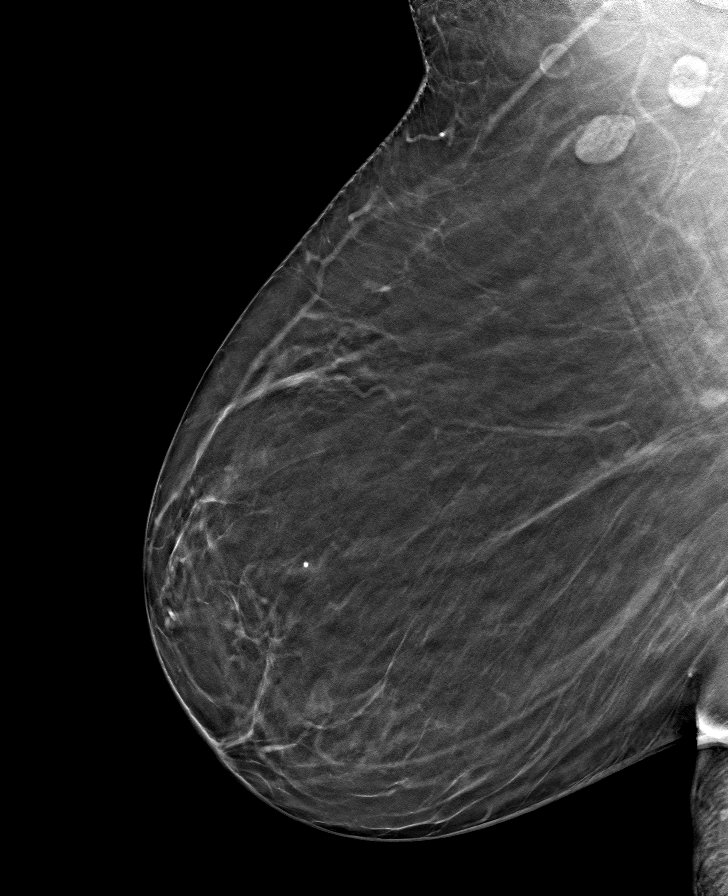

[L CC tomo · tomo slice 36/71.0]
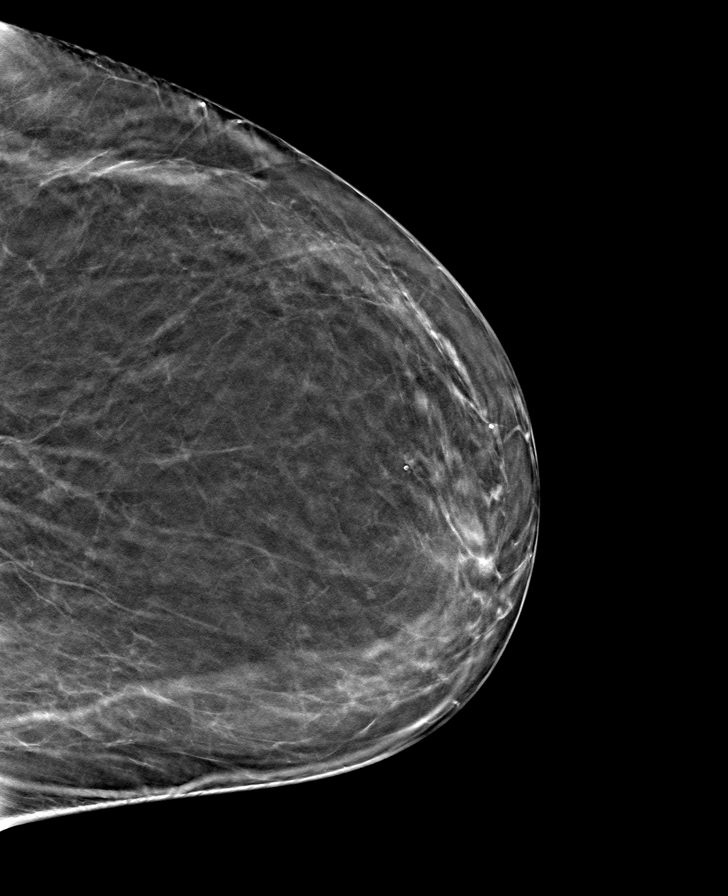

[R CC tomo · tomo slice 35/70.0]
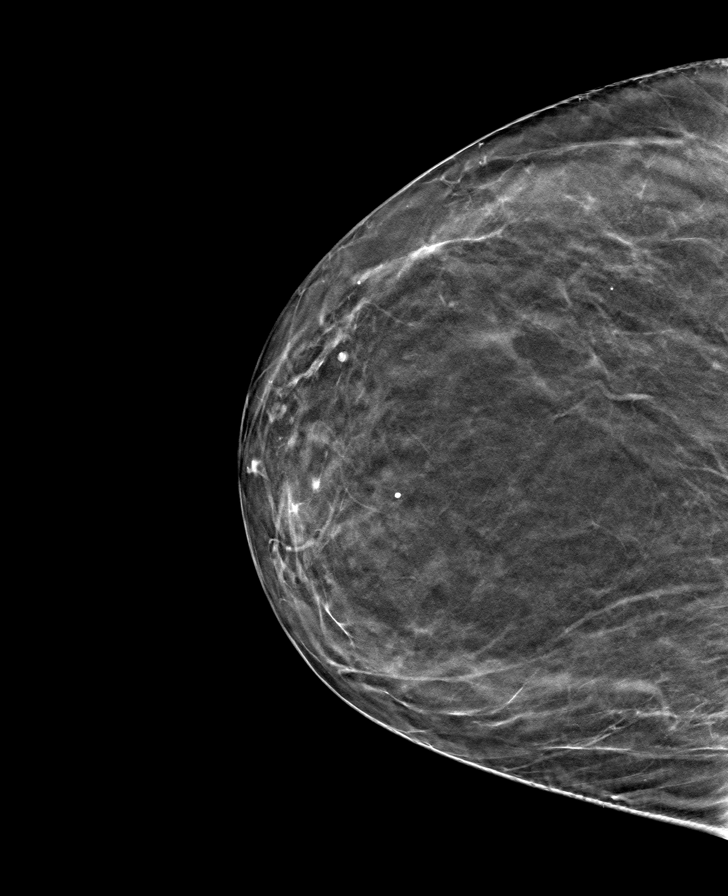

[L MLO tomo · tomo slice 40/79.0]
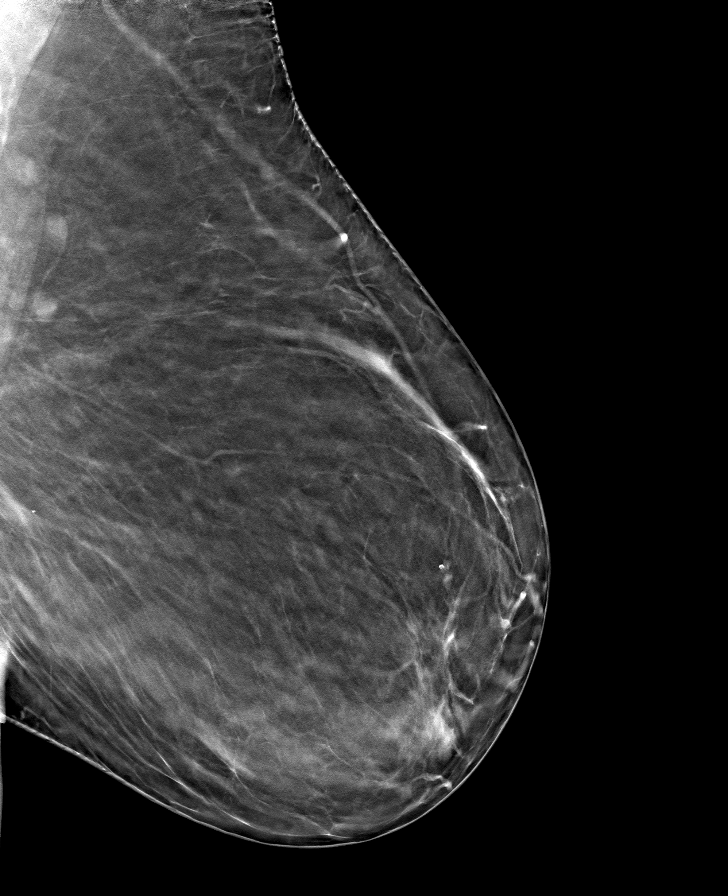

[8 of 24 positions shown; findings below may reference images not displayed]

ACR Breast Density Category b: There are scattered areas of
fibroglandular density.
FINDINGS: In the left breast, a possible mass warrants further evaluation. In
the right breast, no findings suspicious for malignancy.

Images were processed with CAD.
IMPRESSION: Further evaluation is suggested for possible mass in the left
breast.

RECOMMENDATION:
Diagnostic mammogram and possibly ultrasound of the left breast.
(Code:[id number])

The patient will be contacted regarding the findings, and additional
imaging will be scheduled.

BI-RADS CATEGORY  0: Incomplete. Need additional imaging evaluation
and/or prior mammograms for comparison.

## 2019-10-06 DIAGNOSIS — Z7409 Other reduced mobility: Secondary | ICD-10-CM | POA: Diagnosis not present

## 2019-10-06 DIAGNOSIS — Z471 Aftercare following joint replacement surgery: Secondary | ICD-10-CM | POA: Diagnosis not present

## 2019-10-06 DIAGNOSIS — M25559 Pain in unspecified hip: Secondary | ICD-10-CM | POA: Diagnosis not present

## 2019-10-06 DIAGNOSIS — R262 Difficulty in walking, not elsewhere classified: Secondary | ICD-10-CM | POA: Diagnosis not present

## 2019-10-06 DIAGNOSIS — M1611 Unilateral primary osteoarthritis, right hip: Secondary | ICD-10-CM | POA: Diagnosis not present

## 2019-10-06 DIAGNOSIS — Z96641 Presence of right artificial hip joint: Secondary | ICD-10-CM | POA: Diagnosis not present

## 2019-10-06 DIAGNOSIS — Z789 Other specified health status: Secondary | ICD-10-CM | POA: Diagnosis not present

## 2019-10-11 DIAGNOSIS — Z96641 Presence of right artificial hip joint: Secondary | ICD-10-CM | POA: Diagnosis not present

## 2019-10-11 DIAGNOSIS — M1611 Unilateral primary osteoarthritis, right hip: Secondary | ICD-10-CM | POA: Diagnosis not present

## 2019-10-11 DIAGNOSIS — R262 Difficulty in walking, not elsewhere classified: Secondary | ICD-10-CM | POA: Diagnosis not present

## 2019-10-11 DIAGNOSIS — Z7409 Other reduced mobility: Secondary | ICD-10-CM | POA: Diagnosis not present

## 2019-10-11 DIAGNOSIS — Z471 Aftercare following joint replacement surgery: Secondary | ICD-10-CM | POA: Diagnosis not present

## 2019-10-11 DIAGNOSIS — Z789 Other specified health status: Secondary | ICD-10-CM | POA: Diagnosis not present

## 2019-10-11 DIAGNOSIS — M25559 Pain in unspecified hip: Secondary | ICD-10-CM | POA: Diagnosis not present

## 2019-10-13 DIAGNOSIS — M25559 Pain in unspecified hip: Secondary | ICD-10-CM | POA: Diagnosis not present

## 2019-10-13 DIAGNOSIS — M1611 Unilateral primary osteoarthritis, right hip: Secondary | ICD-10-CM | POA: Diagnosis not present

## 2019-10-13 DIAGNOSIS — Z789 Other specified health status: Secondary | ICD-10-CM | POA: Diagnosis not present

## 2019-10-13 DIAGNOSIS — Z7409 Other reduced mobility: Secondary | ICD-10-CM | POA: Diagnosis not present

## 2019-10-13 DIAGNOSIS — Z471 Aftercare following joint replacement surgery: Secondary | ICD-10-CM | POA: Diagnosis not present

## 2019-10-13 DIAGNOSIS — Z96641 Presence of right artificial hip joint: Secondary | ICD-10-CM | POA: Diagnosis not present

## 2019-10-13 DIAGNOSIS — R262 Difficulty in walking, not elsewhere classified: Secondary | ICD-10-CM | POA: Diagnosis not present

## 2019-10-20 DIAGNOSIS — M1611 Unilateral primary osteoarthritis, right hip: Secondary | ICD-10-CM | POA: Diagnosis not present

## 2019-10-20 DIAGNOSIS — M76892 Other specified enthesopathies of left lower limb, excluding foot: Secondary | ICD-10-CM | POA: Diagnosis not present

## 2019-10-20 DIAGNOSIS — M1612 Unilateral primary osteoarthritis, left hip: Secondary | ICD-10-CM | POA: Diagnosis not present

## 2019-10-20 DIAGNOSIS — Z471 Aftercare following joint replacement surgery: Secondary | ICD-10-CM | POA: Diagnosis not present

## 2019-10-20 DIAGNOSIS — Z96641 Presence of right artificial hip joint: Secondary | ICD-10-CM | POA: Diagnosis not present

## 2019-10-20 DIAGNOSIS — M25559 Pain in unspecified hip: Secondary | ICD-10-CM | POA: Diagnosis not present

## 2019-10-20 DIAGNOSIS — Z7409 Other reduced mobility: Secondary | ICD-10-CM | POA: Diagnosis not present

## 2019-10-20 DIAGNOSIS — Z886 Allergy status to analgesic agent status: Secondary | ICD-10-CM | POA: Diagnosis not present

## 2019-10-20 DIAGNOSIS — M76891 Other specified enthesopathies of right lower limb, excluding foot: Secondary | ICD-10-CM | POA: Diagnosis not present

## 2019-10-20 DIAGNOSIS — R262 Difficulty in walking, not elsewhere classified: Secondary | ICD-10-CM | POA: Diagnosis not present

## 2019-10-20 DIAGNOSIS — Z888 Allergy status to other drugs, medicaments and biological substances status: Secondary | ICD-10-CM | POA: Diagnosis not present

## 2019-10-20 DIAGNOSIS — Z882 Allergy status to sulfonamides status: Secondary | ICD-10-CM | POA: Diagnosis not present

## 2019-10-20 DIAGNOSIS — Z789 Other specified health status: Secondary | ICD-10-CM | POA: Diagnosis not present

## 2019-10-20 DIAGNOSIS — Z885 Allergy status to narcotic agent status: Secondary | ICD-10-CM | POA: Diagnosis not present

## 2019-11-15 ENCOUNTER — Other Ambulatory Visit: Payer: Self-pay | Admitting: *Deleted

## 2019-11-15 DIAGNOSIS — Z87891 Personal history of nicotine dependence: Secondary | ICD-10-CM

## 2019-11-15 DIAGNOSIS — Z122 Encounter for screening for malignant neoplasm of respiratory organs: Secondary | ICD-10-CM

## 2019-12-13 ENCOUNTER — Other Ambulatory Visit: Payer: Self-pay

## 2019-12-13 ENCOUNTER — Encounter: Payer: Self-pay | Admitting: Acute Care

## 2019-12-13 ENCOUNTER — Ambulatory Visit (INDEPENDENT_AMBULATORY_CARE_PROVIDER_SITE_OTHER): Payer: PPO | Admitting: Acute Care

## 2019-12-13 ENCOUNTER — Ambulatory Visit (INDEPENDENT_AMBULATORY_CARE_PROVIDER_SITE_OTHER)
Admission: RE | Admit: 2019-12-13 | Discharge: 2019-12-13 | Disposition: A | Discharge status: Home / Self Care | Payer: PPO | Source: Ambulatory Visit | Attending: Acute Care | Admitting: Acute Care

## 2019-12-13 VITALS — BP 156/100 | HR 114 | Temp 97.1°F | Ht 62.0 in | Wt 258.6 lb

## 2019-12-13 DIAGNOSIS — Z87891 Personal history of nicotine dependence: Secondary | ICD-10-CM

## 2019-12-13 DIAGNOSIS — Z122 Encounter for screening for malignant neoplasm of respiratory organs: Secondary | ICD-10-CM

## 2019-12-13 NOTE — Progress Notes (Signed)
Shared Decision Making Visit Lung Cancer Screening Program [id number])   Eligibility:  Age 68 y.o.  Pack Years Smoking History Calculation 37.5 (# packs/per year x # years smoked)  Recent History of coughing up blood  no  Unexplained weight loss? no ( >Than 15 pounds within the last 6 months )  Prior History Lung / other cancer no (Diagnosis within the last 5 years already requiring surveillance chest CT Scans).  Smoking Status Former Smoker  Former Smokers: Years since quit: 5 years  Quit Date: 06/2014  Visit Components:  Discussion included one or more decision making aids. yes  Discussion included risk/benefits of screening. yes  Discussion included potential follow up diagnostic testing for abnormal scans. yes  Discussion included meaning and risk of over diagnosis. yes  Discussion included meaning and risk of False Positives. yes  Discussion included meaning of total radiation exposure. yes  Counseling Included:  Importance of adherence to annual lung cancer LDCT screening. yes  Impact of comorbidities on ability to participate in the program. yes  Ability and willingness to under diagnostic treatment. yes  Smoking Cessation Counseling:  Current Smokers:   Discussed importance of smoking cessation. yes  Information about tobacco cessation classes and interventions provided to patient. yes  Patient provided with "ticket" for LDCT Scan. yes  Symptomatic Patient. no  Counseling  Diagnosis Code: Tobacco Use Z72.0  Asymptomatic Patient yes  Counseling (Intermediate counseling: > three minutes counseling) [id number]  Former Smokers:   Discussed the importance of maintaining cigarette abstinence. yes  Diagnosis Code: Personal History of Nicotine Dependence. [id number]  Information about tobacco cessation classes and interventions provided to patient. Yes  Patient provided with "ticket" for LDCT Scan. yes  Written Order for Lung Cancer Screening with LDCT  placed in Epic. Yes (CT Chest Lung Cancer Screening Low Dose W/O CM) [id number] Z12.2-Screening of respiratory organs Z87.891-Personal history of nicotine dependence  BP (!) 156/100 (BP Location: Right Arm, Cuff Size: Large)   Pulse (!) 114   Temp (!) 97.1 F (36.2 C) (Oral)   Ht 5\' 2"  (1.575 m)   Wt 258 lb 9.6 oz (117.3 kg)   SpO2 95%   BMI 47.30 kg/m    I spent 25 minutes of face to face time with Amber Gutierrez discussing the risks and benefits of lung cancer screening. We viewed a power point together that explained in detail the above noted topics. We took the time to pause the power point at intervals to allow for questions to be asked and answered to ensure understanding. We discussed that she had taken the single most powerful action possible to decrease her risk of developing lung cancer when she quit smoking. I counseled her to remain smoke free, and to contact me if she ever had the desire to smoke again so that I can provide resources and tools to help support the effort to remain smoke free. We discussed the time and location of the scan, and that either  Doroteo Glassman RN or I will call with the results within  24-48 hours of receiving them. She has my card and contact information in the event she needs to speak with me, in addition to a copy of the power point we reviewed as a resource. She verbalized understanding of all of the above and had no further questions upon leaving the office.     I explained to the patient that there has been a high incidence of coronary artery disease noted on these exams. I explained that  this is a non-gated exam therefore degree or severity cannot be determined. This patient is not on statin therapy. I have asked the patient to follow-up with their PCP regarding any incidental finding of coronary artery disease and management with diet or medication as they feel is clinically indicated. The patient verbalized understanding of the above and had no further  questions.  While patient no longer smokes cigarettes, she is currently vaping. I have counseled her against vaping and have explained that we do not know the full impact of vaping  on the lungs, but that we have seen  significant and serious lung injury. She verbalized understanding and states she will work hard to stop smoking.   Amber Spatz, NP 12/13/2019 3:02 PM

## 2019-12-13 NOTE — Patient Instructions (Signed)
Thank you for participating in the [place] Lung Cancer Screening Program. It was our pleasure to meet you today. We will call you with the results of your scan within the next few days. Your scan will be assigned a Lung RADS category score by the physicians reading the scans.  This Lung RADS score determines follow up scanning.  See below for description of categories, and follow up screening recommendations. We will be in touch to schedule your follow up screening annually or based on recommendations of our providers. We will fax a copy of your scan results to your Primary Care Physician, or the physician who referred you to the program, to ensure they have the results. Please call the office if you have any questions or concerns regarding your scanning experience or results.  Our office number is [phone]. Please speak with Denise Phelps, RN. She is our Lung Cancer Screening RN. If she is unavailable when you call, please have the office staff send her a message. She will return your call at her earliest convenience. Remember, if your scan is normal, we will scan you annually as long as you continue to meet the criteria for the program. (Age 55-77, Current smoker or smoker who has quit within the last 15 years). If you are a smoker, remember, quitting is the single most powerful action that you can take to decrease your risk of lung cancer and other pulmonary, breathing related problems. We know quitting is hard, and we are here to help.  Please let us know if there is anything we can do to help you meet your goal of quitting. If you are a former smoker, congratulations. We are proud of you! Remain smoke free! Remember you can refer friends or family members through the number above.  We will screen them to make sure they meet criteria for the program. Thank you for helping us take better care of you by participating in Lung Screening.  Lung RADS Categories:  Lung RADS 1: no nodules  or definitely non-concerning nodules.  Recommendation is for a repeat annual scan in 12 months.  Lung RADS 2:  nodules that are non-concerning in appearance and behavior with a very low likelihood of becoming an active cancer. Recommendation is for a repeat annual scan in 12 months.  Lung RADS 3: nodules that are probably non-concerning , includes nodules with a low likelihood of becoming an active cancer.  Recommendation is for a 6-month repeat screening scan. Often noted after an upper respiratory illness. We will be in touch to make sure you have no questions, and to schedule your 6-month scan.  Lung RADS 4 A: nodules with concerning findings, recommendation is most often for a follow up scan in 3 months or additional testing based on our provider's assessment of the scan. We will be in touch to make sure you have no questions and to schedule the recommended 3 month follow up scan.  Lung RADS 4 B:  indicates findings that are concerning. We will be in touch with you to schedule additional diagnostic testing based on our provider's  assessment of the scan.   

## 2019-12-14 ENCOUNTER — Telehealth: Payer: Self-pay | Admitting: Acute Care

## 2019-12-14 DIAGNOSIS — Z87891 Personal history of nicotine dependence: Secondary | ICD-10-CM

## 2019-12-14 NOTE — Telephone Encounter (Signed)
Results have been called to the patient. Amber Gutierrez will place follow up CT order.

## 2019-12-14 NOTE — Telephone Encounter (Signed)
Patient had a lung cancer screening CT yesterday. Results are in [place].   Sarah, please advise. Thanks!

## 2019-12-14 NOTE — Telephone Encounter (Signed)
Forwarding to [place] to ensure that this is done correctly.

## 2019-12-15 NOTE — Telephone Encounter (Signed)
Results faxed to PCP. Order placed for 3 mth low dose CT.

## 2020-01-12 ENCOUNTER — Other Ambulatory Visit: Payer: Self-pay

## 2020-01-12 ENCOUNTER — Encounter: Payer: Self-pay | Admitting: Family Medicine

## 2020-01-12 MED ORDER — LEVOTHYROXINE SODIUM 112 MCG PO TABS: ORAL_TABLET | ORAL | 0 refills | Status: DC

## 2020-01-17 ENCOUNTER — Telehealth: Payer: PPO

## 2020-01-18 ENCOUNTER — Ambulatory Visit (HOSPITAL_COMMUNITY)
Admission: EM | Admit: 2020-01-18 | Discharge: 2020-01-18 | Disposition: A | Discharge status: Home / Self Care | Payer: PPO | Attending: Physician Assistant | Admitting: Physician Assistant

## 2020-01-18 ENCOUNTER — Ambulatory Visit (INDEPENDENT_AMBULATORY_CARE_PROVIDER_SITE_OTHER): Payer: PPO

## 2020-01-18 ENCOUNTER — Encounter (HOSPITAL_COMMUNITY): Payer: Self-pay

## 2020-01-18 DIAGNOSIS — Z885 Allergy status to narcotic agent status: Secondary | ICD-10-CM | POA: Diagnosis not present

## 2020-01-18 DIAGNOSIS — Z79899 Other long term (current) drug therapy: Secondary | ICD-10-CM | POA: Diagnosis not present

## 2020-01-18 DIAGNOSIS — Z87442 Personal history of urinary calculi: Secondary | ICD-10-CM | POA: Insufficient documentation

## 2020-01-18 DIAGNOSIS — I10 Essential (primary) hypertension: Secondary | ICD-10-CM | POA: Diagnosis not present

## 2020-01-18 DIAGNOSIS — Z20822 Contact with and (suspected) exposure to covid-19: Secondary | ICD-10-CM | POA: Diagnosis not present

## 2020-01-18 DIAGNOSIS — Z8249 Family history of ischemic heart disease and other diseases of the circulatory system: Secondary | ICD-10-CM | POA: Insufficient documentation

## 2020-01-18 DIAGNOSIS — E039 Hypothyroidism, unspecified: Secondary | ICD-10-CM | POA: Diagnosis not present

## 2020-01-18 DIAGNOSIS — Z888 Allergy status to other drugs, medicaments and biological substances status: Secondary | ICD-10-CM | POA: Diagnosis not present

## 2020-01-18 DIAGNOSIS — Z87891 Personal history of nicotine dependence: Secondary | ICD-10-CM | POA: Diagnosis not present

## 2020-01-18 DIAGNOSIS — E785 Hyperlipidemia, unspecified: Secondary | ICD-10-CM | POA: Insufficient documentation

## 2020-01-18 DIAGNOSIS — B9689 Other specified bacterial agents as the cause of diseases classified elsewhere: Secondary | ICD-10-CM | POA: Diagnosis not present

## 2020-01-18 DIAGNOSIS — Z882 Allergy status to sulfonamides status: Secondary | ICD-10-CM | POA: Insufficient documentation

## 2020-01-18 DIAGNOSIS — R05 Cough: Secondary | ICD-10-CM | POA: Insufficient documentation

## 2020-01-18 DIAGNOSIS — N3000 Acute cystitis without hematuria: Secondary | ICD-10-CM | POA: Diagnosis not present

## 2020-01-18 DIAGNOSIS — Z79890 Hormone replacement therapy (postmenopausal): Secondary | ICD-10-CM | POA: Insufficient documentation

## 2020-01-18 DIAGNOSIS — R109 Unspecified abdominal pain: Secondary | ICD-10-CM | POA: Diagnosis not present

## 2020-01-18 DIAGNOSIS — B349 Viral infection, unspecified: Secondary | ICD-10-CM | POA: Diagnosis not present

## 2020-01-18 DIAGNOSIS — R519 Headache, unspecified: Secondary | ICD-10-CM | POA: Diagnosis present

## 2020-01-18 LAB — POCT URINALYSIS DIP (DEVICE)
Bilirubin Urine: NEGATIVE
Glucose, UA: NEGATIVE mg/dL
Ketones, ur: 40 mg/dL — AB
Nitrite: POSITIVE — AB
Protein, ur: >=300 mg/dL — AB
Specific Gravity, Urine: 1.02 (ref 1.005–1.030)
Urobilinogen, UA: 1 mg/dL (ref 0.0–1.0)
pH: 7.5 (ref 5.0–8.0)

## 2020-01-18 LAB — POC SARS CORONAVIRUS 2 AG -  ED: SARS Coronavirus 2 Ag: NEGATIVE

## 2020-01-18 LAB — POC SARS CORONAVIRUS 2 AG: SARS Coronavirus 2 Ag: NEGATIVE

## 2020-01-18 MED ORDER — KETOROLAC TROMETHAMINE 30 MG/ML IJ SOLN
INTRAMUSCULAR | Status: AC
  Filled 2020-01-18: qty 1

## 2020-01-18 MED ORDER — KETOROLAC TROMETHAMINE 30 MG/ML IJ SOLN
30.0000 mg | Freq: Once | INTRAMUSCULAR | Status: AC
  Administered 2020-01-18: 30 mg via INTRAMUSCULAR

## 2020-01-18 MED ORDER — CIPROFLOXACIN HCL 500 MG PO TABS: 500.0000 mg | ORAL_TABLET | Freq: Two times a day (BID) | ORAL | 0 refills | Status: DC

## 2020-01-18 NOTE — ED Triage Notes (Signed)
On Saturday night patient ate some popcorn and begin to have some sharp pains in her stomach. She also is c/o headache.

## 2020-01-18 NOTE — ED Provider Notes (Signed)
[place]    CSN: [id number] Arrival date & time: 01/18/20  1449      History   Chief Complaint Chief Complaint  Patient presents with  . Migraine  . Abdominal Pain    HPI Amber Gutierrez is a 69 y.o. female.   Patient reports to urgent care today for headache, fever, cough and lower abdominal pain.  Patient reports left lower abdominal pain on Friday evening after eating some popcorn. The pain has been improving since that time, however she has not had appetite or eating much food due to concern for diverticulitis. She denies nausea or vomiting. She does endorse mild fever.   She also reports starting Saturday a headache that came on slowly but has progressively worsened. There have been episodes of it decreasing but it seems to come back each time. The pain is described to be frontal and behind her nose. Bright light makes it worse and she endorses some blurry vision today due to pain. She has tried ibuprofen and tylenol, which have given minimal relief. In addition to headache she notes a dry cough started on Saturday and has been persistent since that time. She does report clear nasal discharge.  She denies chest pain or shortness of breath.      Past Medical History:  Diagnosis Date  . Allergic rhinitis   . Bipolar 2 disorder ([place])   . Bladder neoplasm   . Chronic cystitis   . DJD (degenerative joint disease) of lumbar spine    has seen Dr. Joya Salm  . History of basal cell carcinoma excision    2014  --forehead  . History of nephrolithiasis    lithotrypsy calcium stone  . History of radiation therapy    thyroid polyps  1992  . History of recurrent UTIs   . Other postablative hypothyroidism    HX  RAI X2    Patient Active Problem List   Diagnosis Date Noted  . Cough 11/18/2018  . HTN (hypertension) 10/22/2018  . Weight gain 10/22/2018  . Pyelonephritis   . Complicated UTI (urinary tract infection) 07/19/2018  . Sepsis secondary to UTI ([place])  07/19/2018  . Leukocytosis 07/19/2018  . Sepsis due to gram-negative UTI ([place]) 07/19/2018  . Hyperglycemia 07/19/2018  . History of recurrent UTIs 08/12/2017  . Bipolar depression ([place]) 07/12/2013  . Hypothyroidism 11/13/2010  . HLD (hyperlipidemia) 11/13/2010  . OBESITY 11/13/2010  . ADJUSTMENT DISORDER WITH DEPRESSED MOOD 11/13/2010  . Allergic rhinitis 11/13/2010  . Recurrent UTI 11/13/2010  . DEGENERATIVE JOINT DISEASE, BACK 11/13/2010  . NEPHROLITHIASIS, HX OF 11/13/2010    Past Surgical History:  Procedure Laterality Date  . [place]  . CHOLECYSTECTOMY  08/20/2012   Procedure: LAPAROSCOPIC CHOLECYSTECTOMY WITH INTRAOPERATIVE CHOLANGIOGRAM;  Surgeon: Gayland Curry, MD,FACS;  Location: [facility];  Service: General;  Laterality: N/A;  . CYSTOSCOPY WITH BIOPSY N/A 08/27/2013   Procedure: CYSTOSCOPY WITH BLADDER BIOPSY/FULGRATION;  Surgeon: Fredricka Bonine, MD;  Location[facility];  Service: Urology;  Laterality: N/A;  . CYSTOSCOPY WITH BIOPSY N/A 06/20/2017   Procedure: CYSTOSCOPY WITH BIOPSY AND FULGURATION;  Surgeon: Festus Aloe, MD;  Location: [facility];  Service: Urology;  Laterality: N/A;  . EXTRACORPOREAL SHOCK WAVE LITHOTRIPSY  2010  . MASS EXCISION  08/20/2012   Procedure: EXCISION MASS;  Surgeon: Gayland Curry, MD,FACS;  Location: [place];  Service: General;  Laterality: N/A;  . [place]  OB History    Gravida  2   Para  2   Term      Preterm      AB      Living        SAB      TAB      Ectopic      Multiple      Live Births               Home Medications    Prior to Admission medications   Medication Sig Start Date End Date Taking? Authorizing Provider  amitriptyline (ELAVIL) 25 MG tablet Take 50 mg by mouth at bedtime. 07/05/18   [provider]  B Complex Vitamins (VITAMIN B COMPLEX PO) Take 1 capsule by mouth daily.    [provider]  beta  carotene w/minerals (OCUVITE) tablet Take 1 tablet by mouth daily.    [provider]  buPROPion (WELLBUTRIN XL) 300 MG 24 hr tablet Take 300 mg by mouth every morning.     [provider]  ciprofloxacin (CIPRO) 500 MG tablet Take 1 tablet (500 mg total) by mouth every 12 (twelve) hours. 01/18/20   Retta Pitcher, Marguerita Beards, PA-C  levothyroxine (SYNTHROID) 112 MCG tablet TAKE ONE TABLET BY MOUTH EVERY MORNING WITH BREAKFAST. 01/12/20   Lucille Passy, MD  losartan (COZAAR) 25 MG tablet Take 1 tablet (25 mg total) by mouth daily. 11/18/18   Lucille Passy, MD  Melatonin 10 MG TABS Take 10 mg by mouth at bedtime.     [provider]  Multiple Vitamin (MULTIVITAMIN WITH MINERALS) TABS Take 2 tablets by mouth daily.    [provider]  pentosan polysulfate (ELMIRON) 100 MG capsule Take 200 mg by mouth 2 (two) times daily. 11/04/17   [provider]  tolterodine (DETROL LA) 4 MG 24 hr capsule Take 4 mg by mouth daily. 07/11/18   [provider]    Family History Family History  Problem Relation Age of Onset  . Heart attack Father        81  deceased  . Alcohol abuse Father   . Breast cancer Sister   . Gallbladder disease Sister   . Bipolar disorder Mother   . Cancer Maternal Grandmother        colon    Social History Social History   Tobacco Use  . Smoking status: Former Smoker    Packs/day: 0.75    Years: 50.00    Pack years: 37.50    Types: Cigarettes    Start date: 1965    Quit date: 06/2014    Years since quitting: 5.5  . Smokeless tobacco: Never Used  Substance Use Topics  . Alcohol use: No  . Drug use: No     Allergies   Pravastatin sodium, Sulfa antibiotics, Ace inhibitors, and Tramadol   Review of Systems Review of Systems  Constitutional: Positive for appetite change, chills and fever.  HENT: Negative for congestion, ear pain, postnasal drip, rhinorrhea, sinus pressure, sinus pain and sore throat.   Eyes: Negative for pain and  visual disturbance.  Respiratory: Positive for cough. Negative for shortness of breath.   Cardiovascular: Negative for chest pain and palpitations.  Gastrointestinal: Positive for abdominal pain, constipation and nausea. Negative for blood in stool, diarrhea and vomiting.  Genitourinary: Positive for frequency. Negative for dysuria, hematuria and urgency.  Musculoskeletal: Negative for arthralgias, back pain and myalgias.  Skin: Negative for color change and rash.  Neurological: Positive  for headaches. Negative for dizziness, weakness and numbness.  All other systems reviewed and are negative.    Physical Exam Triage Vital Signs ED Triage Vitals  Enc Vitals Group     BP 01/18/20 1610 (!) 161/129     Pulse Rate 01/18/20 1610 (!) 116     Resp 01/18/20 1610 (!) 22     Temp 01/18/20 1610 99.8 F (37.7 C)     Temp Source 01/18/20 1610 Oral     SpO2 01/18/20 1610 99 %     Weight --      Height --      Head Circumference --      Peak Flow --      Pain Score 01/18/20 1609 10     Pain Loc --      Pain Edu? --      Excl. in [place]? --    No data found.  Updated Vital Signs BP (!) 161/129 (BP Location: Right Arm)   Pulse (!) 116   Temp 99.8 F (37.7 C) (Oral)   Resp (!) 22   SpO2 99%   Visual Acuity Right Eye Distance:   Left Eye Distance:   Bilateral Distance:    Right Eye Near:   Left Eye Near:    Bilateral Near:     Physical Exam Vitals and nursing note reviewed.  Constitutional:      General: She is not in acute distress.    Appearance: She is well-developed. She is ill-appearing.  HENT:     Head: Normocephalic and atraumatic.     Mouth/Throat:     Mouth: Mucous membranes are moist.     Pharynx: Oropharynx is clear.  Eyes:     General: No scleral icterus.    Extraocular Movements: Extraocular movements intact.     Conjunctiva/sclera: Conjunctivae normal.     Pupils: Pupils are equal, round, and reactive to light.  Cardiovascular:     Rate and Rhythm: Regular  rhythm. Tachycardia present.     Heart sounds: No murmur.  Pulmonary:     Effort: Pulmonary effort is normal. No respiratory distress.     Breath sounds: Normal breath sounds. No wheezing, rhonchi or rales.  Abdominal:     General: Abdomen is protuberant. Bowel sounds are normal.     Palpations: Abdomen is soft. There is no hepatomegaly or mass.     Tenderness: There is abdominal tenderness in the suprapubic area and left lower quadrant. There is no right CVA tenderness, left CVA tenderness, guarding or rebound.     Hernia: No hernia is present.  Musculoskeletal:     Cervical back: Neck supple.  Skin:    General: Skin is warm and dry.     Capillary Refill: Capillary refill takes less than 2 seconds.     Findings: No rash.  Neurological:     General: No focal deficit present.     Mental Status: She is alert and oriented to person, place, and time.  Psychiatric:        Mood and Affect: Mood normal.        Behavior: Behavior normal.      UC Treatments / Results  Labs (all labs ordered are listed, but only abnormal results are displayed) Labs Reviewed  POCT URINALYSIS DIP (DEVICE) - Abnormal; Notable for the following components:      Result Value   Ketones, ur 40 (*)    Hgb urine dipstick LARGE (*)    Protein, ur >=300 (*)  Nitrite POSITIVE (*)    Leukocytes,Ua LARGE (*)    All other components within normal limits  URINE CULTURE  NOVEL CORONAVIRUS, NAA (HOSP ORDER, SEND-OUT TO REF LAB; TAT 18-24 HRS)  POC SARS CORONAVIRUS 2 AG -  ED  POC SARS CORONAVIRUS 2 AG    EKG   Radiology DG Abd 1 View  Result Date: 01/18/2020 CLINICAL DATA:  69 year old female with abdominal discomfort. No bowel movement. EXAM: ABDOMEN - 1 VIEW COMPARISON:  Abdominal radiograph dated 05/30/2008. FINDINGS: There is no bowel dilatation or evidence of obstruction. No free air identified. Linear radiopaque structure to the right of the lumbar spine may be artifactual or represent a bowel  anastomotic suture. Clinical correlation is recommended. There is degenerative changes of the spine. Right hip arthroplasty. No acute osseous pathology. IMPRESSION: No evidence of bowel obstruction. Electronically Signed   By: Anner Crete M.D.   On: 01/18/2020 17:24    Procedures Procedures (including critical care time)  Medications Ordered in UC Medications  ketorolac (TORADOL) 30 MG/ML injection 30 mg (30 mg Intramuscular Given 01/18/20 1751)    Initial Impression / Assessment and Plan / UC Course  I have reviewed the triage vital signs and the nursing notes.  Pertinent labs & imaging results that were available during my care of the patient were reviewed by me and considered in my medical decision making (see chart for details).  Extensive chart review of past medical history conducted to determine course of treatment plan     #UTI recurrent,history of urosepsis - grossly abnormal UA, culture sent. History of recurrent pseudomonal UTIs, at times resistant to Ciprofloxacin. Chart review Infectious disease Consult note on 02-10-2019, show last urine culture in 12/2018 with pseudomonas sensitive to Cipro. Given history of Urosepsis and evidence of resistance in past, discussed need to follow up tomorrow with her Urologist and/or infectious disease providers. If these are unavailable, she should follow up with her PCP. Will have more information when culture is available.  #Viral Illness - Viral Syndrome present on top of above complaints. Rapid Covid was negative. Headache has been severe. No consistent with migraine. Likely due to virus and poor po intake. Recommended increasing PO intake. COVID PCR sent.  Upright Abdominal film without sign of obstruction, ruled out with complaint of no bowel movements in a few days. Believe this to be related decreased PO intanke.  Strict return and ED precautions discussed, she understands.   Final Clinical Impressions(s) / UC Diagnoses   Final  diagnoses:  Acute cystitis without hematuria  Viral illness     Discharge Instructions     Begin taking the ciprofloxacin 2 times a day  Contact your urologist and your infectious disease providers to discuss recent UTI and pending culture results   Take tylenol 325mg  tablet x 2 up to every 6 hours for sore throat, fever and body aches. Do not exceed 8 tablets in 24 hours   If you begin to run a high fever, fell as though you will pass out, become short of breath, have severe chest pain, go to the emergency department  If your Covid-19 test is positive, you will receive a phone call from [facility] regarding your results. Negative test results are not called. Both positive and negative results area always visible on MyChart. If you do not have a MyChart account, sign up instructions are in your discharge papers.   Persons who are directed to care for themselves at home may discontinue isolation under the following  conditions:  . At least 10 days have passed since symptom onset and . At least 24 hours have passed without running a fever (this means without the use of fever-reducing medications) and . Other symptoms have improved.  Persons infected with COVID-19 who never develop symptoms may discontinue isolation and other precautions 10 days after the date of their first positive COVID-19 test.     ED Prescriptions    Medication Sig Dispense Auth. Provider   ciprofloxacin (CIPRO) 500 MG tablet Take 1 tablet (500 mg total) by mouth every 12 (twelve) hours. 10 tablet Klayten Jolliff, Marguerita Beards, PA-C     PDMP not reviewed this encounter.   Purnell Shoemaker, PA-C 01/18/20 2112

## 2020-01-18 NOTE — Discharge Instructions (Signed)
Begin taking the ciprofloxacin 2 times a day  Contact your urologist and your infectious disease providers to discuss recent UTI and pending culture results   Take tylenol 325mg  tablet x 2 up to every 6 hours for sore throat, fever and body aches. Do not exceed 8 tablets in 24 hours   If you begin to run a high fever, fell as though you will pass out, become short of breath, have severe chest pain, go to the emergency department  If your Covid-19 test is positive, you will receive a phone call from [facility] regarding your results. Negative test results are not called. Both positive and negative results area always visible on MyChart. If you do not have a MyChart account, sign up instructions are in your discharge papers.   Persons who are directed to care for themselves at home may discontinue isolation under the following conditions:   At least 10 days have passed since symptom onset and  At least 24 hours have passed without running a fever (this means without the use of fever-reducing medications) and  Other symptoms have improved.  Persons infected with COVID-19 who never develop symptoms may discontinue isolation and other precautions 10 days after the date of their first positive COVID-19 test.

## 2020-01-19 LAB — URINE CULTURE

## 2020-01-20 LAB — NOVEL CORONAVIRUS, NAA (HOSP ORDER, SEND-OUT TO REF LAB; TAT 18-24 HRS): SARS-CoV-2, NAA: NOT DETECTED

## 2020-01-24 ENCOUNTER — Ambulatory Visit: Payer: PPO | Admitting: Family Medicine

## 2020-01-24 ENCOUNTER — Telehealth (HOSPITAL_COMMUNITY): Payer: Self-pay | Admitting: Physician Assistant

## 2020-01-24 NOTE — Telephone Encounter (Signed)
Called patient to check on status following visit 6 days ago, discuss lab results and encourage follow up with urology specialists. No answer, message left to call Urgent Care back.   Message was also left last week on Thursday 01/20/2020. To this providers knowledge patient has not contacted Urgent Care.

## 2020-01-28 ENCOUNTER — Telehealth: Payer: Self-pay | Admitting: Family Medicine

## 2020-01-28 DIAGNOSIS — E785 Hyperlipidemia, unspecified: Secondary | ICD-10-CM

## 2020-01-28 DIAGNOSIS — E559 Vitamin D deficiency, unspecified: Secondary | ICD-10-CM

## 2020-01-28 DIAGNOSIS — I10 Essential (primary) hypertension: Secondary | ICD-10-CM

## 2020-01-28 DIAGNOSIS — E032 Hypothyroidism due to medicaments and other exogenous substances: Secondary | ICD-10-CM

## 2020-01-28 NOTE — Telephone Encounter (Signed)
Questions for Screening COVID-19  Symptom onset: None  Travel or Contacts: None  During this illness, did/does the patient experience any of the following symptoms? Fever >100.82F []   Yes [x]   No []   Unknown Subjective fever (felt feverish) []   Yes [x]   No []   Unknown Chills []   Yes [x]   No []   Unknown Muscle aches (myalgia) []   Yes [x]   No []   Unknown Runny nose (rhinorrhea) []   Yes [x]   No []   Unknown Sore throat []   Yes [x]   No []   Unknown Cough (new onset or worsening of chronic cough) []   Yes [x]   No []   Unknown Shortness of breath (dyspnea) []   Yes [x]   No []   Unknown Nausea or vomiting []   Yes [x]   No []   Unknown Headache []   Yes [x]   No []   Unknown Abdominal pain  []   Yes [x]   No []   Unknown Diarrhea (?3 loose/looser than normal stools/24hr period) []   Yes [x]   No []   Unknown Other, specify:  Patient risk factors: Smoker? []   Current []   Former []   Never If female, currently pregnant? []   Yes []   No  Patient Active Problem List   Diagnosis Date Noted  . Cough 11/18/2018  . HTN (hypertension) 10/22/2018  . Weight gain 10/22/2018  . Pyelonephritis   . Complicated UTI (urinary tract infection) 07/19/2018  . Sepsis secondary to UTI ([place]) 07/19/2018  . Leukocytosis 07/19/2018  . Sepsis due to gram-negative UTI ([place]) 07/19/2018  . Hyperglycemia 07/19/2018  . History of recurrent UTIs 08/12/2017  . Bipolar depression ([place]) 07/12/2013  . Hypothyroidism 11/13/2010  . HLD (hyperlipidemia) 11/13/2010  . OBESITY 11/13/2010  . ADJUSTMENT DISORDER WITH DEPRESSED MOOD 11/13/2010  . Allergic rhinitis 11/13/2010  . Recurrent UTI 11/13/2010  . DEGENERATIVE JOINT DISEASE, BACK 11/13/2010  . NEPHROLITHIASIS, HX OF 11/13/2010    Plan:  []   High risk for COVID-19 with red flags go to ED (with CP, SOB, weak/lightheaded, or fever > 101.5). Call ahead.  []   High risk for COVID-19 but stable. Inform provider and coordinate time for [facility] visit.   []   No red flags but URI signs or  symptoms okay for [facility] visit.

## 2020-01-28 NOTE — Telephone Encounter (Signed)
Patient  Is calling and stated that is still time for her to come in and do labs but there are no orders in. Pls advise. CB is [phone]

## 2020-01-28 NOTE — Telephone Encounter (Signed)
Per TA labs are in for future order/

## 2020-01-31 ENCOUNTER — Other Ambulatory Visit (INDEPENDENT_AMBULATORY_CARE_PROVIDER_SITE_OTHER): Payer: PPO

## 2020-01-31 ENCOUNTER — Other Ambulatory Visit: Payer: Self-pay

## 2020-01-31 DIAGNOSIS — E785 Hyperlipidemia, unspecified: Secondary | ICD-10-CM | POA: Diagnosis not present

## 2020-01-31 DIAGNOSIS — E559 Vitamin D deficiency, unspecified: Secondary | ICD-10-CM | POA: Diagnosis not present

## 2020-01-31 DIAGNOSIS — E032 Hypothyroidism due to medicaments and other exogenous substances: Secondary | ICD-10-CM

## 2020-01-31 DIAGNOSIS — I10 Essential (primary) hypertension: Secondary | ICD-10-CM | POA: Diagnosis not present

## 2020-01-31 LAB — LIPID PANEL
Cholesterol: 176 mg/dL (ref 0–200)
HDL: 41.4 mg/dL (ref >39.00)
LDL Cholesterol: 97 mg/dL (ref 0–99)
NonHDL: 134.14
Total CHOL/HDL Ratio: 4
Triglycerides: 185 mg/dL — ABNORMAL HIGH (ref 0.0–149.0)
VLDL: 37 mg/dL (ref 0.0–40.0)

## 2020-01-31 LAB — CBC WITH DIFFERENTIAL/PLATELET
Basophils Absolute: 0.1 10*3/uL (ref 0.0–0.1)
Basophils Relative: 1.4 % (ref 0.0–3.0)
Eosinophils Absolute: 0.2 10*3/uL (ref 0.0–0.7)
Eosinophils Relative: 2.9 % (ref 0.0–5.0)
HCT: 42.6 % (ref 36.0–46.0)
Hemoglobin: 14.2 g/dL (ref 12.0–15.0)
Lymphocytes Relative: 21 % (ref 12.0–46.0)
Lymphs Abs: 1.4 10*3/uL (ref 0.7–4.0)
MCHC: 33.2 g/dL (ref 30.0–36.0)
MCV: 82.3 fl (ref 78.0–100.0)
Monocytes Absolute: 0.5 10*3/uL (ref 0.1–1.0)
Monocytes Relative: 7.9 % (ref 3.0–12.0)
Neutro Abs: 4.4 10*3/uL (ref 1.4–7.7)
Neutrophils Relative %: 66.8 % (ref 43.0–77.0)
Platelets: 412 10*3/uL — ABNORMAL HIGH (ref 150.0–400.0)
RBC: 5.18 Mil/uL — ABNORMAL HIGH (ref 3.87–5.11)
RDW: 15.7 % — ABNORMAL HIGH (ref 11.5–15.5)
WBC: 6.6 10*3/uL (ref 4.0–10.5)

## 2020-01-31 LAB — COMPREHENSIVE METABOLIC PANEL
ALT: 14 U/L (ref 0–35)
AST: 13 U/L (ref 0–37)
Albumin: 3.9 g/dL (ref 3.5–5.2)
Alkaline Phosphatase: 105 U/L (ref 39–117)
BUN: 19 mg/dL (ref 6–23)
CO2: 24 mEq/L (ref 19–32)
Calcium: 9.5 mg/dL (ref 8.4–10.5)
Chloride: 102 mEq/L (ref 96–112)
Creatinine, Ser: 1.16 mg/dL (ref 0.40–1.20)
GFR: 46.2 mL/min — ABNORMAL LOW (ref >60.00)
Glucose, Bld: 118 mg/dL — ABNORMAL HIGH (ref 70–99)
Potassium: 4.2 mEq/L (ref 3.5–5.1)
Sodium: 137 mEq/L (ref 135–145)
Total Bilirubin: 0.5 mg/dL (ref 0.2–1.2)
Total Protein: 7.1 g/dL (ref 6.0–8.3)

## 2020-01-31 LAB — TSH: TSH: 7.56 u[IU]/mL — ABNORMAL HIGH (ref 0.35–4.50)

## 2020-01-31 LAB — T4, FREE: Free T4: 0.86 ng/dL (ref 0.60–1.60)

## 2020-01-31 LAB — VITAMIN D 25 HYDROXY (VIT D DEFICIENCY, FRACTURES): VITD: 34.2 ng/mL (ref 30.00–100.00)

## 2020-01-31 NOTE — Progress Notes (Signed)
Virtual Visit via Video   Due to the COVID-19 pandemic, this visit was completed with telemedicine (audio/video) technology to reduce patient and provider exposure as well as to preserve personal protective equipment.   I connected with Amber Gutierrez by a video enabled telemedicine application and verified that I am speaking with the correct person using two identifiers. Location patient: Home Location provider: [facility], Office Persons participating in the virtual visit: Keith Rake, MD   I discussed the limitations of evaluation and management by telemedicine and the availability of in person appointments. The patient expressed understanding and agreed to proceed.  Care Team   Patient Care Team: Lucille Passy, MD as PCP - General (Family Medicine) Orion Crook, MD (Inactive) (Urology) Juanita Craver, MD (Gastroenterology) Megan Salon, MD (Gynecology) Druscilla Brownie, MD (Dermatology) Festus Aloe, MD as Consulting Physician (Urology) Chucky May, MD as Consulting Physician (Psychiatry) Lucille Passy, MD as Consulting Physician (Family Medicine) Christene Lye, MD (General Surgery)  Subjective:   HPI: Patient is connecting today to discuss lab results. See problem based charting.  Review of Systems  Constitutional: Positive for malaise/fatigue. Negative for chills, fever and weight loss.  HENT: Negative.  Negative for congestion and hearing loss.   Eyes: Negative.  Negative for blurred vision, discharge and redness.  Respiratory: Negative.  Negative for cough and shortness of breath.   Cardiovascular: Negative.  Negative for chest pain, palpitations and leg swelling.  Gastrointestinal: Negative.  Negative for abdominal pain and heartburn.  Genitourinary: Negative.  Negative for dysuria.  Musculoskeletal: Negative.  Negative for falls.  Skin: Negative.  Negative for rash.  Neurological: Negative.  Negative for loss of  consciousness and headaches.  Endo/Heme/Allergies: Negative.  Does not bruise/bleed easily.  Psychiatric/Behavioral: Negative for depression and memory loss.       +brain fog  All other systems reviewed and are negative.    Patient Active Problem List   Diagnosis Date Noted  . Cough 11/18/2018  . HTN (hypertension) 10/22/2018  . Weight gain 10/22/2018  . Pyelonephritis   . Complicated UTI (urinary tract infection) 07/19/2018  . Sepsis secondary to UTI ([place]) 07/19/2018  . Leukocytosis 07/19/2018  . Sepsis due to gram-negative UTI ([place]) 07/19/2018  . Hyperglycemia 07/19/2018  . History of recurrent UTIs 08/12/2017  . Bipolar depression ([place]) 07/12/2013  . Hypothyroidism 11/13/2010  . HLD (hyperlipidemia) 11/13/2010  . OBESITY 11/13/2010  . ADJUSTMENT DISORDER WITH DEPRESSED MOOD 11/13/2010  . Allergic rhinitis 11/13/2010  . Recurrent UTI 11/13/2010  . DEGENERATIVE JOINT DISEASE, BACK 11/13/2010  . NEPHROLITHIASIS, HX OF 11/13/2010    Social History   Tobacco Use  . Smoking status: Former Smoker    Packs/day: 0.75    Years: 50.00    Pack years: 37.50    Types: Cigarettes    Start date: 1965    Quit date: 06/2014    Years since quitting: 5.5  . Smokeless tobacco: Never Used  Substance Use Topics  . Alcohol use: No    Current Outpatient Medications:  .  amitriptyline (ELAVIL) 25 MG tablet, Take 50 mg by mouth at bedtime., Disp: , Rfl: 11 .  B Complex Vitamins (VITAMIN B COMPLEX PO), Take 1 capsule by mouth daily., Disp: , Rfl:  .  beta carotene w/minerals (OCUVITE) tablet, Take 1 tablet by mouth daily., Disp: , Rfl:  .  buPROPion (WELLBUTRIN XL) 300 MG 24 hr tablet, Take 300 mg by mouth every morning. , Disp: , Rfl:  .  ciprofloxacin (CIPRO) 500 MG tablet, Take 1 tablet (500 mg total) by mouth every 12 (twelve) hours., Disp: 10 tablet, Rfl: 0 .  levothyroxine (SYNTHROID) 112 MCG tablet, TAKE ONE TABLET BY MOUTH EVERY MORNING WITH BREAKFAST., Disp: 90 tablet, Rfl: 0 .   losartan (COZAAR) 25 MG tablet, Take 1 tablet (25 mg total) by mouth daily., Disp: 90 tablet, Rfl: 3 .  Melatonin 10 MG TABS, Take 10 mg by mouth at bedtime. , Disp: , Rfl:  .  Multiple Vitamin (MULTIVITAMIN WITH MINERALS) TABS, Take 2 tablets by mouth daily., Disp: , Rfl:  .  pentosan polysulfate (ELMIRON) 100 MG capsule, Take 200 mg by mouth 2 (two) times daily., Disp: , Rfl:  .  tolterodine (DETROL LA) 4 MG 24 hr capsule, Take 4 mg by mouth daily., Disp: , Rfl: 11  Allergies  Allergen Reactions  . Pravastatin Sodium Other (See Comments)     Muscle pain and weakness  . Sulfa Antibiotics Hives  . Ace Inhibitors   . Tramadol Other (See Comments)    dizziness    Objective:  BP 129/87   Temp 97.8 F (36.6 C) (Oral)   VITALS: Per patient if applicable, see vitals. GENERAL: Alert, appears well and in no acute distress. HEENT: Atraumatic, conjunctiva clear, no obvious abnormalities on inspection of external nose and ears. NECK: Normal movements of the head and neck. CARDIOPULMONARY: No increased WOB. Speaking in clear sentences. I:E ratio WNL.  MS: Moves all visible extremities without noticeable abnormality. PSYCH: Pleasant and cooperative, well-groomed. Speech normal rate and rhythm. Affect is appropriate. Insight and judgement are appropriate. Attention is focused, linear, and appropriate.  NEURO: CN grossly intact. Oriented as arrived to appointment on time with no prompting. Moves both UE equally.  SKIN: No obvious lesions, wounds, erythema, or cyanosis noted on face or hands.  Depression screen [facility] 2/9 08/18/2019 10/22/2018 09/02/2017  Decreased Interest 0 0 0  Down, Depressed, Hopeless 0 0 0  PHQ - 2 Score 0 0 0     . COVID-19 Education: The signs and symptoms of COVID-19 were discussed with the patient and how to seek care for testing if needed. The importance of social distancing was discussed today. . Reviewed expectations re: course of current medical issues. . Discussed  self-management of symptoms. . Outlined signs and symptoms indicating need for more acute intervention. . Patient verbalized understanding and all questions were answered. Marland Kitchen Health Maintenance issues including appropriate healthy diet, exercise, and smoking avoidance were discussed with patient. . See orders for this visit as documented in the electronic medical record.  Arnette Norris, MD  Records requested if needed. Time spent: 15 minutes, of which >50% was spent in obtaining information about her symptoms, reviewing her previous labs, evaluations, and treatments, counseling her about her condition (please see the discussed topics above), and developing a plan to further investigate it; she had a number of questions which I addressed.   Lab Results  Component Value Date   WBC 5.3 10/22/2018   HGB 15.3 (H) 10/22/2018   HCT 44.1 10/22/2018   PLT 226.0 10/22/2018   GLUCOSE 100 (H) 11/18/2018   CHOL 186 10/22/2018   TRIG 107.0 10/22/2018   HDL 51.40 10/22/2018   LDLCALC 113 (H) 10/22/2018   ALT 13 11/18/2018   AST 14 11/18/2018   NA 139 11/18/2018   K 4.2 11/18/2018   CL 104 11/18/2018   CREATININE 0.83 11/18/2018   BUN 22 11/18/2018   CO2 26 11/18/2018   TSH  3.05 11/18/2018   INR 0.99 07/19/2018   HGBA1C 5.0 07/20/2018    Lab Results  Component Value Date   TSH 3.05 11/18/2018   Lab Results  Component Value Date   WBC 5.3 10/22/2018   HGB 15.3 (H) 10/22/2018   HCT 44.1 10/22/2018   MCV 84.5 10/22/2018   PLT 226.0 10/22/2018   Lab Results  Component Value Date   NA 139 11/18/2018   K 4.2 11/18/2018   CO2 26 11/18/2018   GLUCOSE 100 (H) 11/18/2018   BUN 22 11/18/2018   CREATININE 0.83 11/18/2018   BILITOT 0.5 11/18/2018   ALKPHOS 93 11/18/2018   AST 14 11/18/2018   ALT 13 11/18/2018   PROT 7.0 11/18/2018   ALBUMIN 4.2 11/18/2018   CALCIUM 9.5 11/18/2018   ANIONGAP 6 07/20/2018   GFR 72.51 11/18/2018   Lab Results  Component Value Date   CHOL 186 10/22/2018     Lab Results  Component Value Date   HDL 51.40 10/22/2018   Lab Results  Component Value Date   LDLCALC 113 (H) 10/22/2018   Lab Results  Component Value Date   TRIG 107.0 10/22/2018   Lab Results  Component Value Date   CHOLHDL 4 10/22/2018   Lab Results  Component Value Date   HGBA1C 5.0 07/20/2018       Assessment & Plan:   Problem List Items Addressed This Visit    None      I am having Margarette Asal. Owens Shark maintain her buPROPion, multivitamin with minerals, beta carotene w/minerals, pentosan polysulfate, amitriptyline, B Complex Vitamins (VITAMIN B COMPLEX PO), Melatonin, tolterodine, losartan, levothyroxine, and ciprofloxacin.  No orders of the defined types were placed in this encounter.    Arnette Norris, MD

## 2020-02-01 ENCOUNTER — Encounter: Payer: Self-pay | Admitting: Family Medicine

## 2020-02-01 ENCOUNTER — Ambulatory Visit (INDEPENDENT_AMBULATORY_CARE_PROVIDER_SITE_OTHER): Payer: PPO | Admitting: Family Medicine

## 2020-02-01 VITALS — BP 129/87 | Temp 97.8°F

## 2020-02-01 DIAGNOSIS — E032 Hypothyroidism due to medicaments and other exogenous substances: Secondary | ICD-10-CM

## 2020-02-01 DIAGNOSIS — N3090 Cystitis, unspecified without hematuria: Secondary | ICD-10-CM | POA: Diagnosis not present

## 2020-02-01 DIAGNOSIS — N3010 Interstitial cystitis (chronic) without hematuria: Secondary | ICD-10-CM | POA: Diagnosis not present

## 2020-02-01 LAB — T3: T3, Total: 138 ng/dL (ref 76–181)

## 2020-02-01 MED ORDER — LEVOTHYROXINE SODIUM 100 MCG PO TABS: 100.0000 ug | ORAL_TABLET | Freq: Every day | ORAL | 1 refills | Status: DC

## 2020-02-01 NOTE — Assessment & Plan Note (Addendum)
History:   She currently takes Levothyroxine 165mcg daily and she is concerned about her fluctuations in her thyroid levels.  and her TSH has increased from 3.05 1-year-ago to 7.56. Her FT4 is still in range.  . She is needing 1 year of Thyroid medication.  Current symptoms: change in energy level. Patient denies diarrhea, heat / cold intolerance, nervousness and palpitations. Symptoms have been intermittent. Lab Results  Component Value Date   TSH 7.56 (H) 01/31/2020   TSH 3.05 11/18/2018   TSH 5.73 (H) 10/22/2018   Lab Results  Component Value Date   FREET4 0.86 01/31/2020   FREET4 1.17 11/18/2018   FREET4 1.02 10/22/2018    Assessment/Plan: Decrease synthroid to 100 mcg daily. Recheck thyroid labs in 8 weeks.   Instructed not to take MVM or iron within 4 hours of taking thyroid medications.  Common Reasons for Abnormal TSH Levels on a Previously Stable Dosage of Thyroid Hormone . Patient nonadherent to thyroid hormone regimen (missing doses) . Decreased absorption of thyroid hormone . Patient is now taking thyroid hormone with food . Patient takes thyroid hormone within four hours of calcium, iron, soy products, or aluminum-containing antacids . Patient is prescribed medication that decreases absorption of thyroid hormone, such as cholestyramine (Questran), colestipol (Colestid), orlistat (Xenical), or sucralfate (Carafate) . Patient is now pregnant or recently started or stopped estrogen-containing oral contraceptive or hormone therapy . Generic substitution for brand name or vice versa, or substitution of one generic formulation for another . Patient started on sertraline (Zoloft), another selective serotonin reuptake inhibitor, or a tricyclic antidepressant . Patient started on carbamazepine (Tegretol) or phenytoin (Dilantin)  The patient indicates understanding of these issues and agrees with the plan.

## 2020-02-11 ENCOUNTER — Other Ambulatory Visit: Payer: Self-pay

## 2020-02-11 DIAGNOSIS — E032 Hypothyroidism due to medicaments and other exogenous substances: Secondary | ICD-10-CM

## 2020-02-11 NOTE — Addendum Note (Signed)
Addended by: Marrion Coy on: 02/11/2020 11:31 AM   Modules accepted: Orders

## 2020-03-06 ENCOUNTER — Other Ambulatory Visit: Payer: PPO

## 2020-03-20 ENCOUNTER — Other Ambulatory Visit: Payer: Self-pay

## 2020-03-20 ENCOUNTER — Ambulatory Visit (INDEPENDENT_AMBULATORY_CARE_PROVIDER_SITE_OTHER)
Admission: RE | Admit: 2020-03-20 | Discharge: 2020-03-20 | Disposition: A | Discharge status: Home / Self Care | Payer: PPO | Source: Ambulatory Visit | Attending: Acute Care | Admitting: Acute Care

## 2020-03-20 DIAGNOSIS — R918 Other nonspecific abnormal finding of lung field: Secondary | ICD-10-CM

## 2020-03-20 DIAGNOSIS — J439 Emphysema, unspecified: Secondary | ICD-10-CM | POA: Diagnosis not present

## 2020-03-20 DIAGNOSIS — Z87891 Personal history of nicotine dependence: Secondary | ICD-10-CM

## 2020-03-21 NOTE — Progress Notes (Signed)

## 2020-03-22 ENCOUNTER — Other Ambulatory Visit: Payer: Self-pay | Admitting: *Deleted

## 2020-03-22 DIAGNOSIS — Z87891 Personal history of nicotine dependence: Secondary | ICD-10-CM

## 2020-03-31 ENCOUNTER — Other Ambulatory Visit: Payer: PPO

## 2020-04-03 ENCOUNTER — Other Ambulatory Visit: Payer: Self-pay

## 2020-04-03 ENCOUNTER — Other Ambulatory Visit (INDEPENDENT_AMBULATORY_CARE_PROVIDER_SITE_OTHER): Payer: PPO

## 2020-04-03 DIAGNOSIS — E032 Hypothyroidism due to medicaments and other exogenous substances: Secondary | ICD-10-CM | POA: Diagnosis not present

## 2020-04-03 LAB — TSH: TSH: 4.99 u[IU]/mL — ABNORMAL HIGH (ref 0.35–4.50)

## 2020-04-03 LAB — T4, FREE: Free T4: 0.99 ng/dL (ref 0.60–1.60)

## 2020-04-04 LAB — T3: T3, Total: 141 ng/dL (ref 76–181)

## 2020-04-07 ENCOUNTER — Telehealth: Payer: Self-pay | Admitting: Family Medicine

## 2020-04-07 DIAGNOSIS — E032 Hypothyroidism due to medicaments and other exogenous substances: Secondary | ICD-10-CM

## 2020-04-07 MED ORDER — LEVOTHYROXINE SODIUM 112 MCG PO TABS: 112.0000 ug | ORAL_TABLET | Freq: Every day | ORAL | 3 refills | Status: DC

## 2020-04-07 NOTE — Telephone Encounter (Signed)
Please call pt to let her know TSH is still elevated but improved compared to 2 mo ago. This means she is not getting quite enough levothyroxine at 160mcg daily. Will increase to 160mcg daily and plan to repeat TFTs in 8wks.

## 2020-04-10 NOTE — Telephone Encounter (Signed)
Pt informed and will schedule lab w/new provider.

## 2020-07-04 DIAGNOSIS — R948 Abnormal results of function studies of other organs and systems: Secondary | ICD-10-CM | POA: Diagnosis not present

## 2020-07-04 DIAGNOSIS — R635 Abnormal weight gain: Secondary | ICD-10-CM | POA: Diagnosis not present

## 2020-07-28 DIAGNOSIS — Z7689 Persons encountering health services in other specified circumstances: Secondary | ICD-10-CM | POA: Diagnosis not present

## 2020-07-28 DIAGNOSIS — Z6841 Body Mass Index (BMI) 40.0 and over, adult: Secondary | ICD-10-CM | POA: Diagnosis not present

## 2020-07-28 DIAGNOSIS — D229 Melanocytic nevi, unspecified: Secondary | ICD-10-CM | POA: Diagnosis not present

## 2020-07-28 DIAGNOSIS — E782 Mixed hyperlipidemia: Secondary | ICD-10-CM | POA: Diagnosis not present

## 2020-08-01 DIAGNOSIS — N3010 Interstitial cystitis (chronic) without hematuria: Secondary | ICD-10-CM | POA: Diagnosis not present

## 2020-08-03 DIAGNOSIS — R748 Abnormal levels of other serum enzymes: Secondary | ICD-10-CM | POA: Diagnosis not present

## 2020-08-03 DIAGNOSIS — R635 Abnormal weight gain: Secondary | ICD-10-CM | POA: Diagnosis not present

## 2020-08-03 DIAGNOSIS — I10 Essential (primary) hypertension: Secondary | ICD-10-CM | POA: Diagnosis not present

## 2020-08-03 DIAGNOSIS — E669 Obesity, unspecified: Secondary | ICD-10-CM | POA: Diagnosis not present

## 2020-08-03 DIAGNOSIS — E8881 Metabolic syndrome: Secondary | ICD-10-CM | POA: Diagnosis not present

## 2020-08-03 DIAGNOSIS — R000 Tachycardia, unspecified: Secondary | ICD-10-CM | POA: Diagnosis not present

## 2020-08-03 DIAGNOSIS — Z6841 Body Mass Index (BMI) 40.0 and over, adult: Secondary | ICD-10-CM | POA: Diagnosis not present

## 2020-08-17 DIAGNOSIS — E669 Obesity, unspecified: Secondary | ICD-10-CM | POA: Diagnosis not present

## 2020-08-17 DIAGNOSIS — Z6841 Body Mass Index (BMI) 40.0 and over, adult: Secondary | ICD-10-CM | POA: Diagnosis not present

## 2020-08-23 ENCOUNTER — Ambulatory Visit: Payer: Self-pay | Admitting: *Deleted

## 2020-08-25 DIAGNOSIS — Z6841 Body Mass Index (BMI) 40.0 and over, adult: Secondary | ICD-10-CM | POA: Diagnosis not present

## 2020-08-25 DIAGNOSIS — Z713 Dietary counseling and surveillance: Secondary | ICD-10-CM | POA: Diagnosis not present

## 2020-08-28 DIAGNOSIS — E669 Obesity, unspecified: Secondary | ICD-10-CM | POA: Diagnosis not present

## 2020-08-28 DIAGNOSIS — Z6841 Body Mass Index (BMI) 40.0 and over, adult: Secondary | ICD-10-CM | POA: Diagnosis not present

## 2020-08-28 DIAGNOSIS — I10 Essential (primary) hypertension: Secondary | ICD-10-CM | POA: Diagnosis not present

## 2020-08-29 DIAGNOSIS — L280 Lichen simplex chronicus: Secondary | ICD-10-CM | POA: Diagnosis not present

## 2020-08-29 DIAGNOSIS — L820 Inflamed seborrheic keratosis: Secondary | ICD-10-CM | POA: Diagnosis not present

## 2020-08-29 DIAGNOSIS — L821 Other seborrheic keratosis: Secondary | ICD-10-CM | POA: Diagnosis not present

## 2020-09-07 DIAGNOSIS — Z6841 Body Mass Index (BMI) 40.0 and over, adult: Secondary | ICD-10-CM | POA: Diagnosis not present

## 2020-09-07 DIAGNOSIS — F54 Psychological and behavioral factors associated with disorders or diseases classified elsewhere: Secondary | ICD-10-CM | POA: Diagnosis not present

## 2020-09-07 DIAGNOSIS — E669 Obesity, unspecified: Secondary | ICD-10-CM | POA: Diagnosis not present

## 2020-09-19 DIAGNOSIS — Z6841 Body Mass Index (BMI) 40.0 and over, adult: Secondary | ICD-10-CM | POA: Diagnosis not present

## 2020-09-19 DIAGNOSIS — E669 Obesity, unspecified: Secondary | ICD-10-CM | POA: Diagnosis not present

## 2020-10-06 DIAGNOSIS — Z6841 Body Mass Index (BMI) 40.0 and over, adult: Secondary | ICD-10-CM | POA: Diagnosis not present

## 2020-10-18 DIAGNOSIS — Z6841 Body Mass Index (BMI) 40.0 and over, adult: Secondary | ICD-10-CM | POA: Diagnosis not present

## 2020-10-24 DIAGNOSIS — R1905 Periumbilic swelling, mass or lump: Secondary | ICD-10-CM | POA: Diagnosis not present

## 2020-10-25 ENCOUNTER — Other Ambulatory Visit: Payer: Self-pay | Admitting: Nurse Practitioner

## 2020-10-25 DIAGNOSIS — R1905 Periumbilic swelling, mass or lump: Secondary | ICD-10-CM

## 2020-10-27 DIAGNOSIS — Z6841 Body Mass Index (BMI) 40.0 and over, adult: Secondary | ICD-10-CM | POA: Diagnosis not present

## 2020-11-07 DIAGNOSIS — E669 Obesity, unspecified: Secondary | ICD-10-CM | POA: Diagnosis not present

## 2020-11-07 DIAGNOSIS — Z6841 Body Mass Index (BMI) 40.0 and over, adult: Secondary | ICD-10-CM | POA: Diagnosis not present

## 2020-11-08 ENCOUNTER — Ambulatory Visit
Admission: RE | Admit: 2020-11-08 | Discharge: 2020-11-08 | Disposition: A | Discharge status: Home / Self Care | Payer: PPO | Source: Ambulatory Visit | Attending: Nurse Practitioner | Admitting: Nurse Practitioner

## 2020-11-08 DIAGNOSIS — K429 Umbilical hernia without obstruction or gangrene: Secondary | ICD-10-CM | POA: Diagnosis not present

## 2020-11-08 DIAGNOSIS — N200 Calculus of kidney: Secondary | ICD-10-CM | POA: Diagnosis not present

## 2020-11-08 DIAGNOSIS — K5730 Diverticulosis of large intestine without perforation or abscess without bleeding: Secondary | ICD-10-CM | POA: Diagnosis not present

## 2020-11-08 DIAGNOSIS — R1905 Periumbilic swelling, mass or lump: Secondary | ICD-10-CM

## 2020-11-08 DIAGNOSIS — D1779 Benign lipomatous neoplasm of other sites: Secondary | ICD-10-CM | POA: Diagnosis not present

## 2020-11-08 MED ORDER — IOPAMIDOL (ISOVUE-300) INJECTION 61%
100.0000 mL | Freq: Once | INTRAVENOUS | Status: AC | PRN
  Administered 2020-11-08: 100 mL via INTRAVENOUS

## 2020-11-10 DIAGNOSIS — N3090 Cystitis, unspecified without hematuria: Secondary | ICD-10-CM | POA: Diagnosis not present

## 2020-11-14 DIAGNOSIS — K439 Ventral hernia without obstruction or gangrene: Secondary | ICD-10-CM | POA: Diagnosis not present

## 2020-11-16 DIAGNOSIS — L281 Prurigo nodularis: Secondary | ICD-10-CM | POA: Diagnosis not present

## 2020-11-16 DIAGNOSIS — L820 Inflamed seborrheic keratosis: Secondary | ICD-10-CM | POA: Diagnosis not present

## 2020-11-20 DIAGNOSIS — Z6841 Body Mass Index (BMI) 40.0 and over, adult: Secondary | ICD-10-CM | POA: Diagnosis not present

## 2020-12-01 DIAGNOSIS — Z6841 Body Mass Index (BMI) 40.0 and over, adult: Secondary | ICD-10-CM | POA: Diagnosis not present

## 2020-12-01 DIAGNOSIS — E782 Mixed hyperlipidemia: Secondary | ICD-10-CM | POA: Diagnosis not present

## 2020-12-01 DIAGNOSIS — I10 Essential (primary) hypertension: Secondary | ICD-10-CM | POA: Diagnosis not present

## 2020-12-07 DIAGNOSIS — Z6841 Body Mass Index (BMI) 40.0 and over, adult: Secondary | ICD-10-CM | POA: Diagnosis not present

## 2020-12-12 DIAGNOSIS — Z6841 Body Mass Index (BMI) 40.0 and over, adult: Secondary | ICD-10-CM | POA: Diagnosis not present

## 2021-01-15 NOTE — Progress Notes (Signed)
Your procedure is scheduled on Monday, January 22, 2021 at 8:30 AM.  Report to [facility] Main Entrance "A" at 6:30 A.M., and check in at the Admitting office.  Call this number if you have problems the morning of surgery:  [phone]  Call [phone] if you have any questions prior to your surgery date Monday-Friday 8am-4pm    Remember:  Do not eat or drink after midnight the night before your surgery    Take these medicines the morning of surgery with A SIP OF WATER:  levothyroxine (SYNTHROID) tolterodine (DETROL LA) fluticasone (FLONASE) - if needed  As of today, STOP taking any Aspirin (unless otherwise instructed by your surgeon) Aleve, Naproxen, Ibuprofen, Motrin, Advil, Goody's, BC's, all herbal medications, fish oil, and all vitamins.                      Do not wear jewelry, make up, or nail polish            Do not wear lotions, powders, perfumes, or deodorant.            Do not shave 48 hours prior to surgery.            Do not bring valuables to the hospital.            [facility] is not responsible for any belongings or valuables.  Do NOT Smoke (Tobacco/Vaping) or drink Alcohol 24 hours prior to your procedure If you use a CPAP at night, you may bring all equipment for your overnight stay.   Contacts, glasses, dentures or bridgework may not be worn into surgery.      For patients admitted to the hospital, discharge time will be determined by your treatment team.   Patients discharged the day of surgery will not be allowed to drive home, and someone needs to stay with them for 24 hours.    Special instructions:   [place]- Preparing For Surgery  Before surgery, you can play an important role. Because skin is not sterile, your skin needs to be as free of germs as possible. You can reduce the number of germs on your skin by washing with CHG (chlorahexidine gluconate) Soap before surgery.  CHG is an antiseptic cleaner which kills germs and bonds with the  skin to continue killing germs even after washing.    Oral Hygiene is also important to reduce your risk of infection.  Remember - BRUSH YOUR TEETH THE MORNING OF SURGERY WITH YOUR REGULAR TOOTHPASTE  Please do not use if you have an allergy to CHG or antibacterial soaps. If your skin becomes reddened/irritated stop using the CHG.  Do not shave (including legs and underarms) for at least 48 hours prior to first CHG shower. It is OK to shave your face.  Please follow these instructions carefully.   1. Shower the NIGHT BEFORE SURGERY and the MORNING OF SURGERY with CHG Soap.   2. If you chose to wash your hair, wash your hair first as usual with your normal shampoo.  3. After you shampoo, rinse your hair and body thoroughly to remove the shampoo.  4. Use CHG as you would any other liquid soap. You can apply CHG directly to the skin and wash gently with a scrungie or a clean washcloth.   5. Apply the CHG Soap to your body ONLY FROM THE NECK DOWN.  Do not use on open wounds or open sores. Avoid contact with your eyes, ears, mouth and  genitals (private parts). Wash Face and genitals (private parts)  with your normal soap.   6. Wash thoroughly, paying special attention to the area where your surgery will be performed.  7. Thoroughly rinse your body with warm water from the neck down.  8. DO NOT shower/wash with your normal soap after using and rinsing off the CHG Soap.  9. Pat yourself dry with a CLEAN TOWEL.  10. Wear CLEAN PAJAMAS to bed the night before surgery  11. Place CLEAN SHEETS on your bed the night of your first shower and DO NOT SLEEP WITH PETS.   Day of Surgery: Wear Clean/Comfortable clothing the morning of surgery Do not apply any deodorants/lotions.   Remember to brush your teeth WITH YOUR REGULAR TOOTHPASTE.   Please read over the following fact sheets that you were given.

## 2021-01-16 ENCOUNTER — Encounter (HOSPITAL_COMMUNITY): Payer: Self-pay

## 2021-01-16 ENCOUNTER — Other Ambulatory Visit: Payer: Self-pay

## 2021-01-16 ENCOUNTER — Encounter (HOSPITAL_COMMUNITY)
Admission: RE | Admit: 2021-01-16 | Discharge: 2021-01-16 | Disposition: A | Discharge status: Home / Self Care | Payer: PPO | Source: Ambulatory Visit | Attending: General Surgery | Admitting: General Surgery

## 2021-01-16 DIAGNOSIS — Z01812 Encounter for preprocedural laboratory examination: Secondary | ICD-10-CM | POA: Insufficient documentation

## 2021-01-16 HISTORY — DX: Chronic kidney disease, unspecified: N18.9

## 2021-01-16 LAB — CBC
HCT: 46.5 % — ABNORMAL HIGH (ref 36.0–46.0)
Hemoglobin: 15.7 g/dL — ABNORMAL HIGH (ref 12.0–15.0)
MCH: 28.9 pg (ref 26.0–34.0)
MCHC: 33.8 g/dL (ref 30.0–36.0)
MCV: 85.5 fL (ref 80.0–100.0)
Platelets: 283 10*3/uL (ref 150–400)
RBC: 5.44 MIL/uL — ABNORMAL HIGH (ref 3.87–5.11)
RDW: 13.4 % (ref 11.5–15.5)
WBC: 6.6 10*3/uL (ref 4.0–10.5)
nRBC: 0 % (ref 0.0–0.2)

## 2021-01-16 NOTE — Progress Notes (Signed)
PCP - Eldridge Abrahams, NP Cardiologist - denies  PPM/ICD - denies  Chest x-ray - n/a EKG - n/a Stress Test - denies ECHO - denies Cardiac Cath - denies  Sleep Study - denies  No diabetes  Patient instructed to hold all Aspirin, NSAID's, herbal medications, fish oil and vitamins 7 days prior to surgery.   ERAS Protcol -no  COVID TEST- 01/30/2021 2pm   Anesthesia review: no  Patient denies shortness of breath, fever, cough and chest pain at PAT appointment   All instructions explained to the patient, with a verbal understanding of the material. Patient agrees to go over the instructions while at home for a better understanding. Patient also instructed to self quarantine after being tested for COVID-19. The opportunity to ask questions was provided.

## 2021-01-17 ENCOUNTER — Other Ambulatory Visit: Payer: Self-pay | Admitting: Family Medicine

## 2021-01-17 DIAGNOSIS — E032 Hypothyroidism due to medicaments and other exogenous substances: Secondary | ICD-10-CM

## 2021-01-18 ENCOUNTER — Other Ambulatory Visit (HOSPITAL_COMMUNITY)
Admission: RE | Admit: 2021-01-18 | Discharge: 2021-01-18 | Disposition: A | Discharge status: Home / Self Care | Payer: PPO | Source: Ambulatory Visit | Attending: General Surgery | Admitting: General Surgery

## 2021-01-18 DIAGNOSIS — Z20822 Contact with and (suspected) exposure to covid-19: Secondary | ICD-10-CM | POA: Diagnosis not present

## 2021-01-18 DIAGNOSIS — Z01812 Encounter for preprocedural laboratory examination: Secondary | ICD-10-CM | POA: Insufficient documentation

## 2021-01-19 DIAGNOSIS — E669 Obesity, unspecified: Secondary | ICD-10-CM | POA: Diagnosis not present

## 2021-01-19 DIAGNOSIS — Z6841 Body Mass Index (BMI) 40.0 and over, adult: Secondary | ICD-10-CM | POA: Diagnosis not present

## 2021-01-19 LAB — SARS CORONAVIRUS 2 (TAT 6-24 HRS): SARS Coronavirus 2: NEGATIVE

## 2021-01-21 NOTE — Anesthesia Preprocedure Evaluation (Addendum)
Anesthesia Evaluation  Patient identified by MRN, date of birth, ID band Patient awake    Reviewed: Allergy & Precautions, NPO status , Patient's Chart, lab work & pertinent test results  History of Anesthesia Complications Negative for: history of anesthetic complications  Airway Mallampati: II  TM Distance: >3 FB Neck ROM: Full    Dental  (+) Edentulous Upper, Edentulous Lower, Dental Advisory Given   Pulmonary Current Smoker, former smoker,    Pulmonary exam normal breath sounds clear to auscultation       Cardiovascular hypertension, (-) anginaNormal cardiovascular exam Rhythm:Regular Rate:Normal     Neuro/Psych PSYCHIATRIC DISORDERS Bipolar Disorder Chronic back pain    GI/Hepatic negative GI ROS, Neg liver ROS,   Endo/Other  Hypothyroidism Morbid obesity  Renal/GU Renal diseasestones   Bladder neoplasm    Musculoskeletal  (+) Arthritis ,   Abdominal (+) + obese,   Peds  Hematology negative hematology ROS (+)   Anesthesia Other Findings   Reproductive/Obstetrics                            Anesthesia Physical  Anesthesia Plan  ASA: III  Anesthesia Plan: General   Post-op Pain Management:    Induction: Intravenous  PONV Risk Score and Plan: 4 or greater and Ondansetron, Dexamethasone, Midazolam, Scopolamine patch - Pre-op, Aprepitant and Treatment may vary due to age or medical condition  Airway Management Planned: Oral ETT  Additional Equipment: None  Intra-op Plan:   Post-operative Plan: Extubation in OR  Informed Consent: I have reviewed the patients History and Physical, chart, labs and discussed the procedure including the risks, benefits and alternatives for the proposed anesthesia with the patient or authorized representative who has indicated his/her understanding and acceptance.     Dental advisory given  Plan Discussed with: CRNA  Anesthesia Plan  Comments:        Anesthesia Quick Evaluation

## 2021-01-22 ENCOUNTER — Encounter (HOSPITAL_COMMUNITY): Payer: Self-pay | Admitting: General Surgery

## 2021-01-22 ENCOUNTER — Encounter (HOSPITAL_COMMUNITY)
Admission: RE | Disposition: A | Discharge status: Home / Self Care | Payer: Self-pay | Source: Home / Self Care | Attending: General Surgery

## 2021-01-22 ENCOUNTER — Ambulatory Visit (HOSPITAL_COMMUNITY): Payer: PPO | Admitting: Anesthesiology

## 2021-01-22 ENCOUNTER — Other Ambulatory Visit: Payer: Self-pay

## 2021-01-22 ENCOUNTER — Ambulatory Visit (HOSPITAL_COMMUNITY)
Admission: RE | Admit: 2021-01-22 | Discharge: 2021-01-22 | Disposition: A | Discharge status: Home / Self Care | Payer: PPO | Attending: General Surgery | Admitting: General Surgery

## 2021-01-22 ENCOUNTER — Ambulatory Visit: Payer: Self-pay | Admitting: General Surgery

## 2021-01-22 DIAGNOSIS — Z886 Allergy status to analgesic agent status: Secondary | ICD-10-CM | POA: Diagnosis not present

## 2021-01-22 DIAGNOSIS — F319 Bipolar disorder, unspecified: Secondary | ICD-10-CM | POA: Diagnosis not present

## 2021-01-22 DIAGNOSIS — Z803 Family history of malignant neoplasm of breast: Secondary | ICD-10-CM | POA: Insufficient documentation

## 2021-01-22 DIAGNOSIS — Z811 Family history of alcohol abuse and dependence: Secondary | ICD-10-CM | POA: Diagnosis not present

## 2021-01-22 DIAGNOSIS — Z885 Allergy status to narcotic agent status: Secondary | ICD-10-CM | POA: Insufficient documentation

## 2021-01-22 DIAGNOSIS — Z888 Allergy status to other drugs, medicaments and biological substances status: Secondary | ICD-10-CM | POA: Diagnosis not present

## 2021-01-22 DIAGNOSIS — Z833 Family history of diabetes mellitus: Secondary | ICD-10-CM | POA: Insufficient documentation

## 2021-01-22 DIAGNOSIS — Z8379 Family history of other diseases of the digestive system: Secondary | ICD-10-CM | POA: Diagnosis not present

## 2021-01-22 DIAGNOSIS — Z882 Allergy status to sulfonamides status: Secondary | ICD-10-CM | POA: Insufficient documentation

## 2021-01-22 DIAGNOSIS — Z79899 Other long term (current) drug therapy: Secondary | ICD-10-CM | POA: Insufficient documentation

## 2021-01-22 DIAGNOSIS — K439 Ventral hernia without obstruction or gangrene: Secondary | ICD-10-CM | POA: Insufficient documentation

## 2021-01-22 DIAGNOSIS — I10 Essential (primary) hypertension: Secondary | ICD-10-CM | POA: Diagnosis not present

## 2021-01-22 DIAGNOSIS — E039 Hypothyroidism, unspecified: Secondary | ICD-10-CM | POA: Diagnosis not present

## 2021-01-22 DIAGNOSIS — Z8249 Family history of ischemic heart disease and other diseases of the circulatory system: Secondary | ICD-10-CM | POA: Diagnosis not present

## 2021-01-22 DIAGNOSIS — Z87891 Personal history of nicotine dependence: Secondary | ICD-10-CM | POA: Diagnosis not present

## 2021-01-22 HISTORY — PX: INSERTION OF MESH: SHX5868

## 2021-01-22 HISTORY — PX: VENTRAL HERNIA REPAIR: SHX424

## 2021-01-22 SURGERY — REPAIR, HERNIA, VENTRAL, LAPAROSCOPIC
Anesthesia: General | Site: Abdomen

## 2021-01-22 MED ORDER — DEXAMETHASONE SODIUM PHOSPHATE 10 MG/ML IJ SOLN
INTRAMUSCULAR | Status: DC | PRN
  Administered 2021-01-22: 5 mg via INTRAVENOUS

## 2021-01-22 MED ORDER — LACTATED RINGERS IV SOLN: INTRAVENOUS | Status: DC

## 2021-01-22 MED ORDER — PROMETHAZINE HCL 25 MG/ML IJ SOLN
6.2500 mg | INTRAMUSCULAR | Status: DC | PRN
Start: 2021-01-22 — End: 2021-01-22

## 2021-01-22 MED ORDER — GABAPENTIN 300 MG PO CAPS
300.0000 mg | ORAL_CAPSULE | ORAL | Status: AC
  Administered 2021-01-22: 300 mg via ORAL

## 2021-01-22 MED ORDER — APREPITANT 40 MG PO CAPS
40.0000 mg | ORAL_CAPSULE | Freq: Once | ORAL | Status: AC
  Administered 2021-01-22: 40 mg via ORAL
  Filled 2021-01-22 (×2): qty 1

## 2021-01-22 MED ORDER — FENTANYL CITRATE (PF) 250 MCG/5ML IJ SOLN
INTRAMUSCULAR | Status: DC | PRN
  Administered 2021-01-22 (×3): 50 ug via INTRAVENOUS
  Administered 2021-01-22: 100 ug via INTRAVENOUS

## 2021-01-22 MED ORDER — CHLORHEXIDINE GLUCONATE 0.12 % MT SOLN
15.0000 mL | Freq: Once | OROMUCOSAL | Status: AC
  Administered 2021-01-22: 15 mL via OROMUCOSAL

## 2021-01-22 MED ORDER — ROCURONIUM BROMIDE 10 MG/ML (PF) SYRINGE
PREFILLED_SYRINGE | INTRAVENOUS | Status: DC | PRN
  Administered 2021-01-22: 60 mg via INTRAVENOUS

## 2021-01-22 MED ORDER — FENTANYL CITRATE (PF) 250 MCG/5ML IJ SOLN
INTRAMUSCULAR | Status: AC
  Filled 2021-01-22: qty 5

## 2021-01-22 MED ORDER — LIDOCAINE 2% (20 MG/ML) 5 ML SYRINGE
INTRAMUSCULAR | Status: AC
  Filled 2021-01-22: qty 5

## 2021-01-22 MED ORDER — ORAL CARE MOUTH RINSE: 15.0000 mL | Freq: Once | OROMUCOSAL | Status: AC

## 2021-01-22 MED ORDER — ONDANSETRON HCL 4 MG/2ML IJ SOLN
INTRAMUSCULAR | Status: AC
  Filled 2021-01-22: qty 2

## 2021-01-22 MED ORDER — ACETAMINOPHEN 500 MG PO TABS
1000.0000 mg | ORAL_TABLET | ORAL | Status: AC
  Administered 2021-01-22: 1000 mg via ORAL

## 2021-01-22 MED ORDER — PROPOFOL 10 MG/ML IV BOLUS
INTRAVENOUS | Status: DC | PRN
  Administered 2021-01-22: 160 mg via INTRAVENOUS

## 2021-01-22 MED ORDER — GABAPENTIN 300 MG PO CAPS
ORAL_CAPSULE | ORAL | Status: AC
  Filled 2021-01-22: qty 1

## 2021-01-22 MED ORDER — DEXMEDETOMIDINE (PRECEDEX) IN NS 20 MCG/5ML (4 MCG/ML) IV SYRINGE
PREFILLED_SYRINGE | INTRAVENOUS | Status: DC | PRN
  Administered 2021-01-22 (×2): 10 ug via INTRAVENOUS

## 2021-01-22 MED ORDER — EPHEDRINE 5 MG/ML INJ
INTRAVENOUS | Status: AC
  Filled 2021-01-22: qty 10

## 2021-01-22 MED ORDER — PHENYLEPHRINE 40 MCG/ML (10ML) SYRINGE FOR IV PUSH (FOR BLOOD PRESSURE SUPPORT)
PREFILLED_SYRINGE | INTRAVENOUS | Status: AC
  Filled 2021-01-22: qty 10

## 2021-01-22 MED ORDER — HYDROMORPHONE HCL 1 MG/ML IJ SOLN
INTRAMUSCULAR | Status: AC
  Filled 2021-01-22: qty 1

## 2021-01-22 MED ORDER — CEFAZOLIN SODIUM-DEXTROSE 2-4 GM/100ML-% IV SOLN
2.0000 g | INTRAVENOUS | Status: AC
  Administered 2021-01-22: 2 g via INTRAVENOUS

## 2021-01-22 MED ORDER — LIDOCAINE 2% (20 MG/ML) 5 ML SYRINGE
INTRAMUSCULAR | Status: DC | PRN
  Administered 2021-01-22: 100 mg via INTRAVENOUS

## 2021-01-22 MED ORDER — PHENYLEPHRINE HCL-NACL 10-0.9 MG/250ML-% IV SOLN
INTRAVENOUS | Status: DC | PRN
  Administered 2021-01-22: 20 ug/min via INTRAVENOUS

## 2021-01-22 MED ORDER — HYDROCODONE-ACETAMINOPHEN 5-325 MG PO TABS: 1.0000 | ORAL_TABLET | Freq: Four times a day (QID) | ORAL | 0 refills | Status: DC | PRN

## 2021-01-22 MED ORDER — MIDAZOLAM HCL 2 MG/2ML IJ SOLN
INTRAMUSCULAR | Status: AC
  Filled 2021-01-22: qty 2

## 2021-01-22 MED ORDER — 0.9 % SODIUM CHLORIDE (POUR BTL) OPTIME
TOPICAL | Status: DC | PRN
  Administered 2021-01-22: 1000 mL

## 2021-01-22 MED ORDER — ONDANSETRON HCL 4 MG/2ML IJ SOLN
INTRAMUSCULAR | Status: DC | PRN
  Administered 2021-01-22: 4 mg via INTRAVENOUS

## 2021-01-22 MED ORDER — ROCURONIUM BROMIDE 10 MG/ML (PF) SYRINGE
PREFILLED_SYRINGE | INTRAVENOUS | Status: AC
  Filled 2021-01-22: qty 10

## 2021-01-22 MED ORDER — SUGAMMADEX SODIUM 200 MG/2ML IV SOLN
INTRAVENOUS | Status: DC | PRN
  Administered 2021-01-22: 200 mg via INTRAVENOUS

## 2021-01-22 MED ORDER — ACETAMINOPHEN 500 MG PO TABS
ORAL_TABLET | ORAL | Status: AC
  Filled 2021-01-22: qty 2

## 2021-01-22 MED ORDER — CHLORHEXIDINE GLUCONATE CLOTH 2 % EX PADS: 6.0000 | MEDICATED_PAD | Freq: Once | CUTANEOUS | Status: DC

## 2021-01-22 MED ORDER — BUPIVACAINE HCL (PF) 0.25 % IJ SOLN
INTRAMUSCULAR | Status: DC | PRN
  Administered 2021-01-22: 10 mL

## 2021-01-22 MED ORDER — EPHEDRINE SULFATE-NACL 50-0.9 MG/10ML-% IV SOSY
PREFILLED_SYRINGE | INTRAVENOUS | Status: DC | PRN
  Administered 2021-01-22: 10 mg via INTRAVENOUS

## 2021-01-22 MED ORDER — MIDAZOLAM HCL 2 MG/2ML IJ SOLN
INTRAMUSCULAR | Status: DC | PRN
  Administered 2021-01-22: 2 mg via INTRAVENOUS

## 2021-01-22 MED ORDER — BUPIVACAINE HCL (PF) 0.25 % IJ SOLN
INTRAMUSCULAR | Status: AC
  Filled 2021-01-22: qty 30

## 2021-01-22 MED ORDER — PROPOFOL 10 MG/ML IV BOLUS
INTRAVENOUS | Status: AC
  Filled 2021-01-22: qty 40

## 2021-01-22 MED ORDER — DEXAMETHASONE SODIUM PHOSPHATE 10 MG/ML IJ SOLN
INTRAMUSCULAR | Status: AC
  Filled 2021-01-22: qty 1

## 2021-01-22 MED ORDER — MEPERIDINE HCL 25 MG/ML IJ SOLN: 6.2500 mg | INTRAMUSCULAR | Status: DC | PRN

## 2021-01-22 MED ORDER — HYDROMORPHONE HCL 1 MG/ML IJ SOLN
0.2500 mg | INTRAMUSCULAR | Status: DC | PRN
  Administered 2021-01-22 (×2): 0.5 mg via INTRAVENOUS

## 2021-01-22 MED ORDER — CEFAZOLIN SODIUM-DEXTROSE 2-4 GM/100ML-% IV SOLN
INTRAVENOUS | Status: AC
  Filled 2021-01-22: qty 100

## 2021-01-22 SURGICAL SUPPLY — 47 items
ADH SKN CLS APL DERMABOND .7 (GAUZE/BANDAGES/DRESSINGS) ×1
APL PRP STRL LF DISP 70% ISPRP (MISCELLANEOUS) ×1
BINDER ABDOMINAL 12 ML 46-62 (SOFTGOODS) IMPLANT
BNDG GAUZE ELAST 4 BULKY (GAUZE/BANDAGES/DRESSINGS) IMPLANT
CANISTER SUCT 3000ML PPV (MISCELLANEOUS) IMPLANT
CHLORAPREP W/TINT 26 (MISCELLANEOUS) ×2 IMPLANT
COVER SURGICAL LIGHT HANDLE (MISCELLANEOUS) ×2 IMPLANT
COVER WAND RF STERILE (DRAPES) IMPLANT
DERMABOND ADVANCED (GAUZE/BANDAGES/DRESSINGS) ×1
DERMABOND ADVANCED .7 DNX12 (GAUZE/BANDAGES/DRESSINGS) ×1 IMPLANT
DEVICE SECURE STRAP 25 ABSORB (INSTRUMENTS) ×2 IMPLANT
DEVICE TROCAR PUNCTURE CLOSURE (ENDOMECHANICALS) ×2 IMPLANT
DRAPE INCISE IOBAN 66X45 STRL (DRAPES) ×2 IMPLANT
DRAPE LAPAROSCOPIC ABDOMINAL (DRAPES) IMPLANT
ELECT CAUTERY BLADE 6.4 (BLADE) ×2 IMPLANT
ELECT REM PT RETURN 9FT ADLT (ELECTROSURGICAL) ×2
ELECTRODE REM PT RTRN 9FT ADLT (ELECTROSURGICAL) ×1 IMPLANT
GLOVE BIO SURGEON STRL SZ7.5 (GLOVE) ×2 IMPLANT
GOWN STRL REUS W/ TWL LRG LVL3 (GOWN DISPOSABLE) ×3 IMPLANT
GOWN STRL REUS W/TWL LRG LVL3 (GOWN DISPOSABLE) ×6
KIT BASIN OR (CUSTOM PROCEDURE TRAY) ×2 IMPLANT
KIT TURNOVER KIT B (KITS) ×2 IMPLANT
MARKER SKIN DUAL TIP RULER LAB (MISCELLANEOUS) ×2 IMPLANT
MESH VENTRALIGHT ST 4.5IN (Mesh General) ×2 IMPLANT
NEEDLE SPNL 22GX3.5 QUINCKE BK (NEEDLE) ×2 IMPLANT
NS IRRIG 1000ML POUR BTL (IV SOLUTION) ×2 IMPLANT
PAD ARMBOARD 7.5X6 YLW CONV (MISCELLANEOUS) ×4 IMPLANT
PENCIL BUTTON HOLSTER BLD 10FT (ELECTRODE) IMPLANT
SCISSORS LAP 5X35 DISP (ENDOMECHANICALS) ×2 IMPLANT
SET IRRIG TUBING LAPAROSCOPIC (IRRIGATION / IRRIGATOR) IMPLANT
SET TUBE SMOKE EVAC HIGH FLOW (TUBING) ×2 IMPLANT
SHEARS HARMONIC ACE PLUS 36CM (ENDOMECHANICALS) ×2 IMPLANT
SLEEVE ENDOPATH XCEL 5M (ENDOMECHANICALS) ×4 IMPLANT
SUT MNCRL AB 4-0 PS2 18 (SUTURE) ×4 IMPLANT
SUT NOVA NAB DX-16 0-1 5-0 T12 (SUTURE) ×2 IMPLANT
SUT VIC AB 3-0 SH 27 (SUTURE) ×2
SUT VIC AB 3-0 SH 27X BRD (SUTURE) ×1 IMPLANT
SUT VIC AB 3-0 SH 27XBRD (SUTURE) IMPLANT
TOWEL GREEN STERILE (TOWEL DISPOSABLE) ×2 IMPLANT
TOWEL GREEN STERILE FF (TOWEL DISPOSABLE) ×2 IMPLANT
TRAY FOLEY W/BAG SLVR 16FR (SET/KITS/TRAYS/PACK) ×2
TRAY FOLEY W/BAG SLVR 16FR ST (SET/KITS/TRAYS/PACK) ×1 IMPLANT
TRAY LAPAROSCOPIC MC (CUSTOM PROCEDURE TRAY) ×2 IMPLANT
TROCAR XCEL BLUNT TIP 100MML (ENDOMECHANICALS) IMPLANT
TROCAR XCEL NON-BLD 11X100MML (ENDOMECHANICALS) IMPLANT
TROCAR XCEL NON-BLD 5MMX100MML (ENDOMECHANICALS) ×2 IMPLANT
WATER STERILE IRR 1000ML POUR (IV SOLUTION) ×2 IMPLANT

## 2021-01-22 NOTE — H&P (Signed)
Amber Gutierrez  Location: [facility] Patient #: [id number] DOB: [birth year] Married / Language: English / Race: White Female   History of Present Illness The patient is a 70 year old female who presents with abdominal pain. We are asked to see the patient in consultation by Dr. Eldridge Abrahams to evaluate her for a ventral hernia. The patient is a 70 year old white female who has been experiencing a bulge just above her umbilicus for the last year. She has some tenderness associated with it. She denies any fevers or chills. She denies any nausea or vomiting. She believes it began when she had a GI bug and had terrible nausea and vomiting and diarrhea. Shortly after that she started to notice a bulge just above the umbilicus. She had a CT scan recently that did show a small fat-containing ventral hernia above the umbilicus. She also had some other benign findings that do not require surgical intervention at this time.   Past Surgical History  Cesarean Section - Multiple  Colon Polyp Removal - Colonoscopy  Gallbladder Surgery - Laparoscopic  Hip Surgery  Right. Oral Surgery   Diagnostic Studies History  Mammogram  1-3 years ago Pap Smear  1-5 years ago  Allergies  Pravastatin Sodium *ANTIHYPERLIPIDEMICS*  Sulfacetamide *CHEMICALS*  ACE Abdomen Surgical Binder *MEDICAL DEVICES AND SUPPLIES*  traMADol HCl *ANALGESICS - OPIOID*   Medication History  B Complex (Oral) Active. Ocuvite (Oral) Active. Levothyroxine Sodium (112MCG Tablet, Oral) Active. Melatonin (10MG  Tablet, Oral) Active. Multiple Vitamin (Oral) Active. Elmiron (100MG  Capsule, Oral) Active. Detrol LA (4MG  Capsule ER 24HR, Oral) Active. Medications Reconciled  Social History  Alcohol use  Occasional alcohol use. Caffeine use  Coffee. Illicit drug use  Uses socially only. Tobacco use  Former smoker.  Family History Alcohol Abuse  Father. Breast Cancer  Sister. Diabetes  Mellitus  Father. Heart Disease  Father. Heart disease in female family member before age 39  Hypertension  Father. Ischemic Bowel Disease  Son.  Pregnancy / Birth History Age at menarche  49 years. Age of menopause  26-59 Contraceptive History  Oral contraceptives. Gravida  2 Irregular periods  Length (months) of breastfeeding  3-6 Maternal age  73-25 Para  2  Other Problems  Bladder Problems  Kidney Stone  Thyroid Disease     Review of Systems  General Present- Weight Loss. Not Present- Appetite Loss, Chills, Fatigue, Fever, Night Sweats and Weight Gain. Skin Not Present- Change in Wart/Mole, Dryness, Hives, Jaundice, New Lesions, Non-Healing Wounds, Rash and Ulcer. HEENT Present- Wears glasses/contact lenses. Not Present- Earache, Hearing Loss, Hoarseness, Nose Bleed, Oral Ulcers, Ringing in the Ears, Seasonal Allergies, Sinus Pain, Sore Throat, Visual Disturbances and Yellow Eyes. Respiratory Present- Chronic Cough. Not Present- Bloody sputum, Difficulty Breathing, Snoring and Wheezing. Breast Not Present- Breast Mass, Breast Pain, Nipple Discharge and Skin Changes. Cardiovascular Not Present- Chest Pain, Difficulty Breathing Lying Down, Leg Cramps, Palpitations, Rapid Heart Rate, Shortness of Breath and Swelling of Extremities. Gastrointestinal Not Present- Abdominal Pain, Bloating, Bloody Stool, Change in Bowel Habits, Chronic diarrhea, Constipation, Difficulty Swallowing, Excessive gas, Gets full quickly at meals, Hemorrhoids, Indigestion, Nausea, Rectal Pain and Vomiting. Female Genitourinary Not Present- Frequency, Nocturia, Painful Urination, Pelvic Pain and Urgency. Musculoskeletal Present- Back Pain. Not Present- Joint Pain, Joint Stiffness, Muscle Pain, Muscle Weakness and Swelling of Extremities. Neurological Not Present- Decreased Memory, Fainting, Headaches, Numbness, Seizures, Tingling, Tremor, Trouble walking and Weakness. Psychiatric Not Present-  Anxiety, Bipolar, Change in Sleep Pattern, Depression, Fearful and Frequent crying. Endocrine  Not Present- Cold Intolerance, Excessive Hunger, Hair Changes, Heat Intolerance, Hot flashes and New Diabetes. Hematology Not Present- Blood Thinners, Easy Bruising, Excessive bleeding, Gland problems, HIV and Persistent Infections.   Physical Exam  General Mental Status-Alert. General Appearance-Consistent with stated age. Hydration-Well hydrated. Voice-Normal.  Head and Neck Head-normocephalic, atraumatic with no lesions or palpable masses. Trachea-midline. Thyroid Gland Characteristics - normal size and consistency.  Eye Eyeball - Bilateral-Extraocular movements intact. Sclera/Conjunctiva - Bilateral-No scleral icterus.  Chest and Lung Exam Chest and lung exam reveals -quiet, even and easy respiratory effort with no use of accessory muscles and on auscultation, normal breath sounds, no adventitious sounds and normal vocal resonance. Inspection Chest Wall - Normal. Back - normal.  Cardiovascular Cardiovascular examination reveals -normal heart sounds, regular rate and rhythm with no murmurs and normal pedal pulses bilaterally.  Abdomen Note: The abdomen is soft. There is mild tenderness to palpation just above the umbilicus. There is a well-healed lower midline scar. There is no evidence of obstruction.   Neurologic Neurologic evaluation reveals -alert and oriented x 3 with no impairment of recent or remote memory. Mental Status-Normal.  Musculoskeletal Normal Exam - Left-Upper Extremity Strength Normal and Lower Extremity Strength Normal. Normal Exam - Right-Upper Extremity Strength Normal and Lower Extremity Strength Normal.  Lymphatic Head & Neck  General Head & Neck Lymphatics: Bilateral - Description - Normal. Axillary  General Axillary Region: Bilateral - Description - Normal. Tenderness - Non Tender. Femoral & Inguinal  Generalized  Femoral & Inguinal Lymphatics: Bilateral - Description - Normal. Tenderness - Non Tender.    Assessment & Plan VENTRAL HERNIA WITHOUT OBSTRUCTION OR GANGRENE (K43.9) Impression: The patient appears to have a small but symptomatic ventral hernia just above the umbilicus. Because of the risk of incarceration and strangulation and think she would benefit from having this fixed. She would also like to have this done. I have discussed with her in detail the risks and benefits of the operation as well as some of the technical aspects including the use of mesh and the risk of chronic pain and she understands and wishes to proceed. I think she would be a good laparoscopic candidate given her previous surgeries. This patient encounter took 30 minutes today to perform the following: take history, perform exam, review outside records, interpret imaging, counsel the patient on their diagnosis and document encounter, findings & plan in the EHR

## 2021-01-22 NOTE — Transfer of Care (Signed)
Immediate Anesthesia Transfer of Care Note  Patient: Amber Gutierrez  Procedure(s) Performed: LAPAROSCOPIC VENTRAL HERNIA REPAIR WITH MESH (N/A Abdomen) INSERTION OF MESH (N/A Abdomen)  Patient Location: PACU  Anesthesia Type:General  Level of Consciousness: drowsy  Airway & Oxygen Therapy: Patient Spontanous Breathing and Patient connected to face mask oxygen  Post-op Assessment: Report given to RN and Post -op Vital signs reviewed and stable  Post vital signs: Reviewed and stable  Last Vitals:  Vitals Value Taken Time  BP 151/77 01/22/21 1015  Temp    Pulse 78 01/22/21 1018  Resp 17 01/22/21 1018  SpO2 97 % 01/22/21 1018  Vitals shown include unvalidated device data.  Last Pain:  Vitals:   01/22/21 0717  TempSrc:   PainSc: 0-No pain         Complications: No complications documented.

## 2021-01-22 NOTE — Progress Notes (Signed)
Pt's respirations are unlabored and lung movement symmetrical, but pt has phlegm from ET tube for surgery. This RN is sending an incentive spirometer for prophylaxis.

## 2021-01-22 NOTE — Interval H&P Note (Signed)
History and Physical Interval Note:  01/22/2021 8:11 AM  Amber Gutierrez  has presented today for surgery, with the diagnosis of ventral hernia.  The various methods of treatment have been discussed with the patient and family. After consideration of risks, benefits and other options for treatment, the patient has consented to  Procedure(s): [place] (N/A) as a surgical intervention.  The patient's history has been reviewed, patient examined, no change in status, stable for surgery.  I have reviewed the patient's chart and labs.  Questions were answered to the patient's satisfaction.     Autumn Messing III

## 2021-01-22 NOTE — Op Note (Signed)
01/22/2021  10:04 AM  PATIENT:  Amber Gutierrez  70 y.o. female  PRE-OPERATIVE DIAGNOSIS:  ventral hernia  POST-OPERATIVE DIAGNOSIS:  ventral hernia  PROCEDURE:  Procedure(s): LAPAROSCOPIC VENTRAL HERNIA REPAIR WITH MESH (N/A) INSERTION OF MESH (N/A)  SURGEON:  Surgeon(s) and Role:    * Jovita Kussmaul, MD - Primary  PHYSICIAN ASSISTANT:   ASSISTANTS: Dr. Manus Gunning   ANESTHESIA:   local and general  EBL:  20 mL   BLOOD ADMINISTERED:none  DRAINS: none   LOCAL MEDICATIONS USED:  MARCAINE     SPECIMEN:  No Specimen  DISPOSITION OF SPECIMEN:  N/A  COUNTS:  YES  TOURNIQUET:  * No tourniquets in log *  DICTATION: .Dragon Dictation   After informed consent was obtained the patient was brought to the operating room and placed in the supine position on the operating table.  After adequate induction of general anesthesia the patient's abdomen was prepped with ChloraPrep, allowed to dry, and draped in usual sterile manner including the use of an Ioban drape.  A site was chosen in the left upper quadrant for accessing the abdominal cavity.  This area was infiltrated with quarter percent Marcaine.  A small stab incision was made with a 15 blade knife.  A 5 mm Optiview port and camera were used to bluntly dissected the layers of the abdominal wall under direct vision until access was gained to the abdominal cavity.  The abdomen was then insufflated with carbon dioxide without difficulty.  The camera was placed back through the 5 mm port and the abdomen was inspected.  The only abnormality that was noted was omentum adherent to the anterior abdominal wall just above the umbilicus.  There did not appear to be any bowel involved with the hernia.  Another 5 mm port was placed under direct vision in the left lower quadrant.  A harmonic scalpel was then used to take down the omental adhesions to the anterior abdominal wall.  Once this was accomplished we could see the small hernia defect very  clearly.  We measured it using a spinal needle and then chose an 11 cm round piece of ventral light mesh.  The mesh was oriented with the coated side towards the bowel.  Four #1 Novafil stitches were placed at equidistant points around the edge of the mesh.  Next a small incision was made through the middle of the hernia defect with a 15 blade knife.  The incision was carried through the skin and subcutaneous tissue sharply with electrocautery until the hernia was entered.  The fascial edges were healthy appearing.  We then placed the mesh into the abdominal cavity with the appropriate orientation.  The fascia was then closed with interrupted #1 Novafil stitches.  The abdomen was then insufflated again.  4 small stab incisions were made at points that corresponded with the anchor stitches.  Suture passer was then placed through each of these incisions and used to bring the tails of each stitch through the abdominal wall.  Once this was accomplished the stitches were cinched down and tied and the mesh was observed to be in good apposition to the anterior abdominal wall without any redundancy.  The gaps between the stitches were then filled in with a secure strap tacker.  Once this was accomplished then the mesh was in good apposition to the abdominal wall without any redundancy or gaps.  The area was observed for several minutes and found to be hemostatic.  No other abnormalities were noted  on inspection of the abdomen.  The gas was then allowed to escape and the mesh was appeared to remain in good apposition to the anterior abdominal wall.  The subcutaneous tissue of the incision just above the umbilicus was closed with interrupted 3-0 Vicryl stitches.  The skin incisions were then closed with 4-0 Monocryl subcuticular stitches.  Dermabond dressings were applied.  The patient tolerated the procedure well.  At the end of the case all needle sponge and instrument counts were correct.  The patient was then awakened and  taken recovery in stable condition.  PLAN OF CARE: Discharge to home after PACU  PATIENT DISPOSITION:  PACU - hemodynamically stable.   Delay start of Pharmacological VTE agent (>24hrs) due to surgical blood loss or risk of bleeding: not applicable

## 2021-01-22 NOTE — Progress Notes (Signed)
Called Dr. Marlou Starks and informed him we still need orders.

## 2021-01-22 NOTE — Anesthesia Procedure Notes (Signed)
Procedure Name: Intubation Date/Time: 01/22/2021 8:41 AM Performed by: Reece Agar, CRNA Pre-anesthesia Checklist: Patient identified, Emergency Drugs available, Suction available and Patient being monitored Patient Re-evaluated:Patient Re-evaluated prior to induction Oxygen Delivery Method: Circle System Utilized Preoxygenation: Pre-oxygenation with 100% oxygen Induction Type: IV induction Ventilation: Mask ventilation without difficulty and Oral airway inserted - appropriate to patient size Laryngoscope Size: Mac and 3 Grade View: Grade I Tube type: Oral Tube size: 7.0 mm Number of attempts: 1 Airway Equipment and Method: Stylet and Oral airway Placement Confirmation: ETT inserted through vocal cords under direct vision,  positive ETCO2 and breath sounds checked- equal and bilateral Secured at: 21 cm Tube secured with: Tape Dental Injury: Teeth and Oropharynx as per pre-operative assessment

## 2021-01-23 ENCOUNTER — Encounter (HOSPITAL_COMMUNITY): Payer: Self-pay | Admitting: General Surgery

## 2021-01-23 NOTE — Anesthesia Postprocedure Evaluation (Signed)
Anesthesia Post Note  Patient: Amber Gutierrez  Procedure(s) Performed: LAPAROSCOPIC VENTRAL HERNIA REPAIR WITH MESH (N/A Abdomen) INSERTION OF MESH (N/A Abdomen)     Patient location during evaluation: PACU Anesthesia Type: General Level of consciousness: sedated and patient cooperative Pain management: pain level controlled Vital Signs Assessment: post-procedure vital signs reviewed and stable Respiratory status: spontaneous breathing Cardiovascular status: stable Anesthetic complications: no   No complications documented.  Last Vitals:  Vitals:   01/22/21 1045 01/22/21 1100  BP: (!) 141/83 138/76  Pulse: 79 73  Resp: 14 16  Temp:  36.4 C  SpO2: 92% 93%    Last Pain:  Vitals:   01/22/21 1045  TempSrc:   PainSc: [place]

## 2021-01-30 DEATH — deceased

## 2021-02-16 IMAGING — DX DG ABDOMEN 1V
2 series · 2 of 2 positions shown · non-contrast
Comparison: Abdominal radiograph dated 05/30/2008.

CLINICAL DATA: 68-year-old female with abdominal discomfort. No
bowel movement.

EXAM:
ABDOMEN - 1 VIEW

[abdomen kub (1 of 2)]
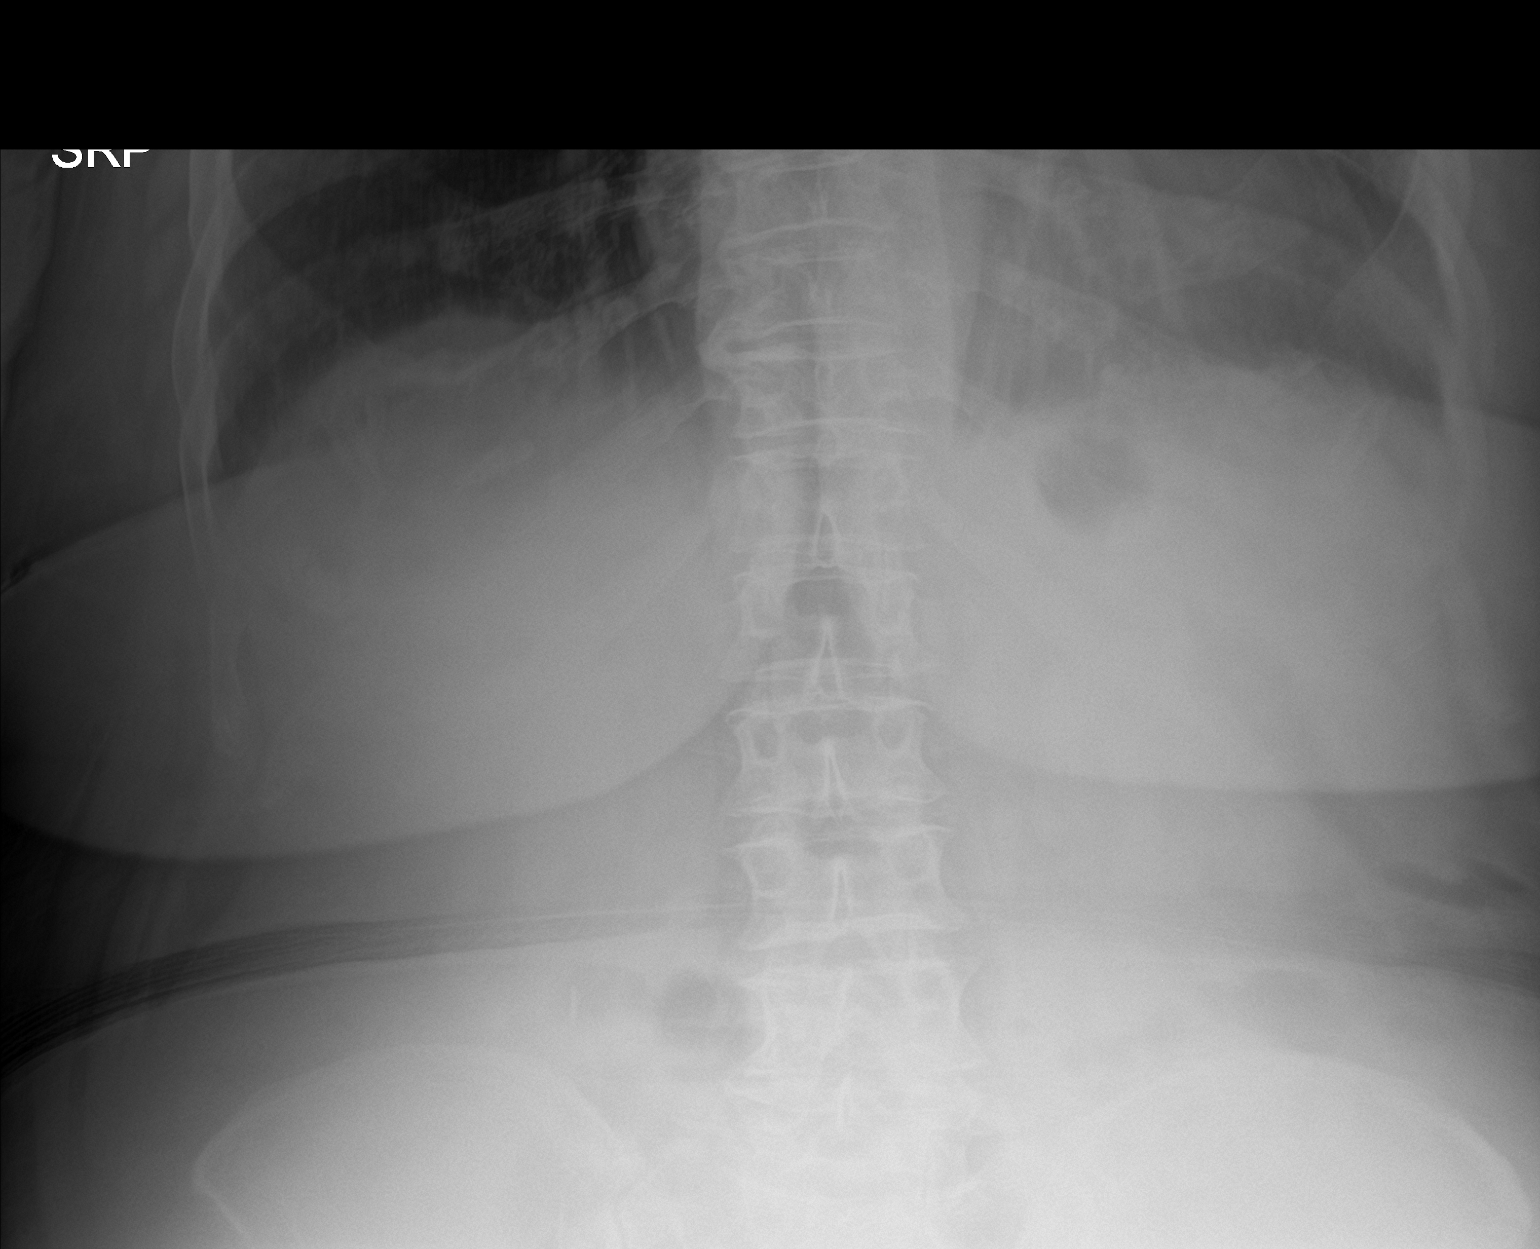

[abdomen kub (2 of 2)]
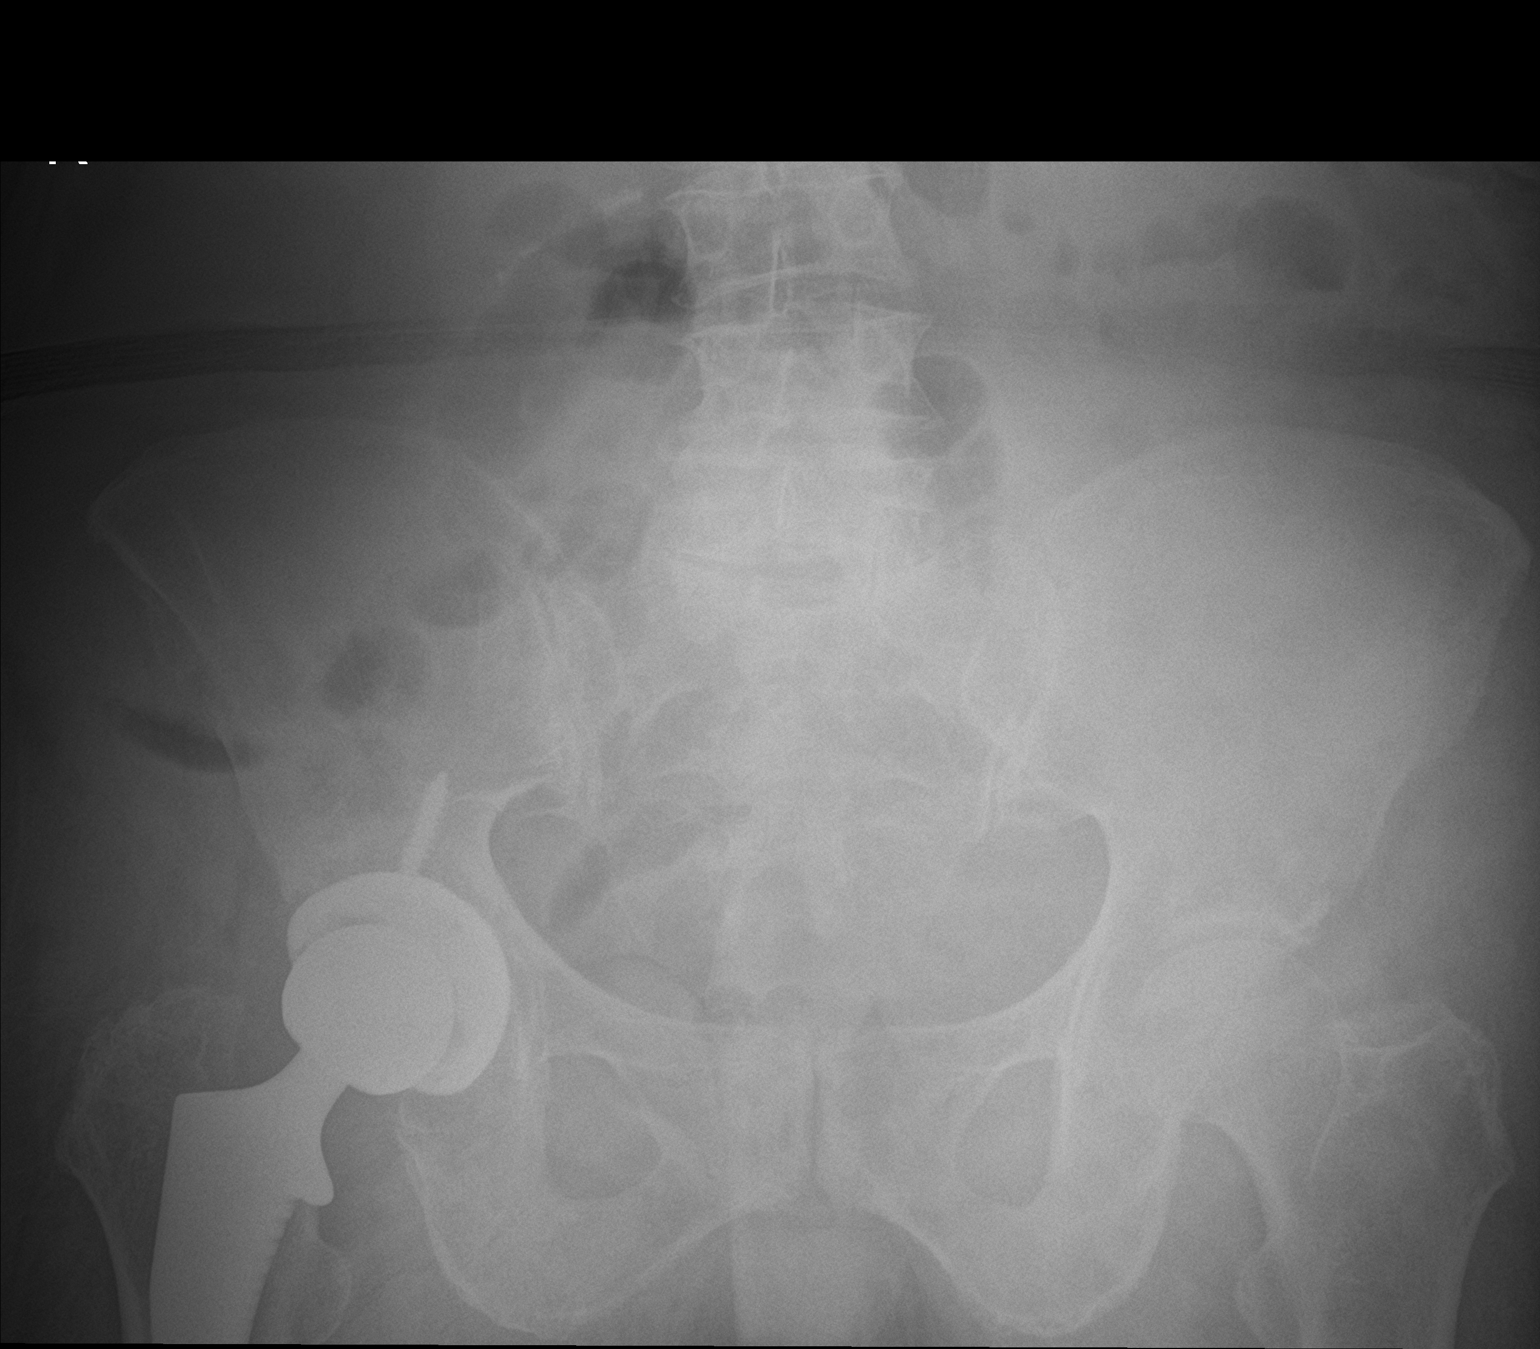

[2 of 2 positions shown; findings below may reference images not displayed]

FINDINGS: There is no bowel dilatation or evidence of obstruction. No free air
identified. Linear radiopaque structure to the right of the lumbar
spine may be artifactual or represent a bowel anastomotic suture.
Clinical correlation is recommended. There is degenerative changes
of the spine. Right hip arthroplasty. No acute osseous pathology.
IMPRESSION: No evidence of bowel obstruction.

## 2021-03-13 ENCOUNTER — Other Ambulatory Visit: Payer: Self-pay | Admitting: Family Medicine

## 2021-03-13 DIAGNOSIS — E032 Hypothyroidism due to medicaments and other exogenous substances: Secondary | ICD-10-CM

## 2021-04-17 ENCOUNTER — Ambulatory Visit (INDEPENDENT_AMBULATORY_CARE_PROVIDER_SITE_OTHER)
Admission: RE | Admit: 2021-04-17 | Discharge: 2021-04-17 | Disposition: A | Discharge status: Home / Self Care | Payer: PPO | Source: Ambulatory Visit | Attending: Nurse Practitioner | Admitting: Nurse Practitioner

## 2021-04-17 ENCOUNTER — Other Ambulatory Visit: Payer: Self-pay

## 2021-04-17 DIAGNOSIS — Z87891 Personal history of nicotine dependence: Secondary | ICD-10-CM

## 2021-04-19 DIAGNOSIS — Z7951 Long term (current) use of inhaled steroids: Secondary | ICD-10-CM | POA: Diagnosis not present

## 2021-04-19 DIAGNOSIS — I10 Essential (primary) hypertension: Secondary | ICD-10-CM | POA: Diagnosis not present

## 2021-04-19 DIAGNOSIS — Z79899 Other long term (current) drug therapy: Secondary | ICD-10-CM | POA: Diagnosis not present

## 2021-04-19 DIAGNOSIS — Z1159 Encounter for screening for other viral diseases: Secondary | ICD-10-CM | POA: Diagnosis not present

## 2021-04-19 DIAGNOSIS — E782 Mixed hyperlipidemia: Secondary | ICD-10-CM | POA: Diagnosis not present

## 2021-04-19 DIAGNOSIS — Z1231 Encounter for screening mammogram for malignant neoplasm of breast: Secondary | ICD-10-CM | POA: Diagnosis not present

## 2021-04-19 DIAGNOSIS — I7090 Unspecified atherosclerosis: Secondary | ICD-10-CM | POA: Diagnosis not present

## 2021-04-19 DIAGNOSIS — F319 Bipolar disorder, unspecified: Secondary | ICD-10-CM | POA: Diagnosis not present

## 2021-04-19 DIAGNOSIS — Z1211 Encounter for screening for malignant neoplasm of colon: Secondary | ICD-10-CM | POA: Diagnosis not present

## 2021-04-19 DIAGNOSIS — E039 Hypothyroidism, unspecified: Secondary | ICD-10-CM | POA: Diagnosis not present

## 2021-04-19 DIAGNOSIS — J432 Centrilobular emphysema: Secondary | ICD-10-CM | POA: Diagnosis not present

## 2021-04-19 DIAGNOSIS — Z7984 Long term (current) use of oral hypoglycemic drugs: Secondary | ICD-10-CM | POA: Diagnosis not present

## 2021-04-19 DIAGNOSIS — Z0000 Encounter for general adult medical examination without abnormal findings: Secondary | ICD-10-CM | POA: Diagnosis not present

## 2021-04-23 ENCOUNTER — Other Ambulatory Visit: Payer: Self-pay | Admitting: Nurse Practitioner

## 2021-04-23 DIAGNOSIS — Z1231 Encounter for screening mammogram for malignant neoplasm of breast: Secondary | ICD-10-CM

## 2021-04-30 ENCOUNTER — Other Ambulatory Visit: Payer: Self-pay | Admitting: *Deleted

## 2021-04-30 DIAGNOSIS — Z87891 Personal history of nicotine dependence: Secondary | ICD-10-CM

## 2021-04-30 NOTE — Progress Notes (Signed)
Please call patient and let them  know their  low dose Ct was read as a Lung RADS 2: nodules that are benign in appearance and behavior with a very low likelihood of becoming a clinically active cancer due to size or lack of growth. Recommendation per radiology is for a repeat LDCT in 12 months. Please let them  know we will order and schedule their  annual screening scan for 03/2022. Please let them  know there was notation of CAD on their  scan.  Please remind the patient  that this is a non-gated exam therefore degree or severity of disease  cannot be determined. Please have them  follow up with their PCP regarding potential risk factor modification, dietary therapy or pharmacologic therapy if clinically indicated. Pt.  is not  currently on statin therapy. Please place order for annual  screening scan for  03/2022 and fax results to PCP. Thanks so much. 

## 2021-05-03 ENCOUNTER — Other Ambulatory Visit: Payer: Self-pay | Admitting: Family Medicine

## 2021-05-03 DIAGNOSIS — E032 Hypothyroidism due to medicaments and other exogenous substances: Secondary | ICD-10-CM

## 2021-05-31 DIAGNOSIS — Z86010 Personal history of colonic polyps: Secondary | ICD-10-CM | POA: Diagnosis not present

## 2021-05-31 DIAGNOSIS — K5730 Diverticulosis of large intestine without perforation or abscess without bleeding: Secondary | ICD-10-CM | POA: Diagnosis not present

## 2021-05-31 DIAGNOSIS — Z1211 Encounter for screening for malignant neoplasm of colon: Secondary | ICD-10-CM | POA: Diagnosis not present

## 2021-06-04 ENCOUNTER — Ambulatory Visit: Payer: Self-pay | Admitting: Cardiology

## 2021-06-12 DIAGNOSIS — Z87891 Personal history of nicotine dependence: Secondary | ICD-10-CM | POA: Diagnosis not present

## 2021-06-12 DIAGNOSIS — J432 Centrilobular emphysema: Secondary | ICD-10-CM | POA: Diagnosis not present

## 2021-06-25 ENCOUNTER — Ambulatory Visit: Payer: PPO

## 2021-06-27 ENCOUNTER — Other Ambulatory Visit: Payer: Self-pay

## 2021-06-27 ENCOUNTER — Ambulatory Visit: Payer: Medicare Other | Admitting: Cardiology

## 2021-06-27 ENCOUNTER — Encounter: Payer: Self-pay | Admitting: Cardiology

## 2021-06-27 VITALS — BP 108/78 | HR 105 | Temp 98.4°F | Resp 16 | Ht 62.0 in | Wt 242.0 lb

## 2021-06-27 DIAGNOSIS — I700 Atherosclerosis of aorta: Secondary | ICD-10-CM | POA: Insufficient documentation

## 2021-06-27 DIAGNOSIS — R0609 Other forms of dyspnea: Secondary | ICD-10-CM | POA: Insufficient documentation

## 2021-06-27 MED ORDER — ROSUVASTATIN CALCIUM 10 MG PO TABS: 10.0000 mg | ORAL_TABLET | Freq: Every day | ORAL | 1 refills | Status: AC

## 2021-06-27 NOTE — Progress Notes (Signed)
Patient referred by Berkley Harvey, NP for aortic atherosclerosis  Subjective:   Amber Gutierrez, female    DOB: [birth year], 70 y.o.   MRN: [id number]   Chief Complaint  Patient presents with   Aortic atherosclerosis    Hypertension   New Patient (Initial Visit)     HPI  70 y.o. Caucasian female with prior tobacco dependence, ocular disorder,h/o bladder neoplasm, only for 4 aortic atherosclerosis.  Aortic atherosclerosis was found incidentally on CT scanning for lung cancer screening.  Of note, there was no coronary atherosclerosis or calcification noted.  Patient is retired, stays active with outdoor yard work.  She has had unchanged exertional dyspnea for several years, but denies any chest pain.  Also denies any claudication, orthopnea, PND, leg edema symptoms.  When she has quit smoking, she still has passive tobacco exposure through her husband who smokes inside the house.   Past Medical History:  Diagnosis Date   Allergic rhinitis    Bipolar 2 disorder ([place])    Bladder neoplasm    Chronic cystitis    Chronic kidney disease    frequent UTIs   DJD (degenerative joint disease) of lumbar spine    has seen Dr. Joya Salm   History of basal cell carcinoma excision    2014  --forehead   History of nephrolithiasis    lithotrypsy calcium stone   History of radiation therapy    thyroid polyps  1992   History of recurrent UTIs    Other postablative hypothyroidism    HX  RAI X2     Past Surgical History:  Procedure Laterality Date   [place]  08/20/2012   Procedure: LAPAROSCOPIC CHOLECYSTECTOMY WITH INTRAOPERATIVE CHOLANGIOGRAM;  Surgeon: Gayland Curry, MD,FACS;  Location: [place];  Service: General;  Laterality: N/A;   CYSTOSCOPY WITH BIOPSY N/A 08/27/2013   Procedure: CYSTOSCOPY WITH BLADDER BIOPSY/FULGRATION;  Surgeon: Fredricka Bonine, MD;  Location[facility];  Service: Urology;   Laterality: N/A;   CYSTOSCOPY WITH BIOPSY N/A 06/20/2017   Procedure: CYSTOSCOPY WITH BIOPSY AND FULGURATION;  Surgeon: Festus Aloe, MD;  Location: [facility];  Service: Urology;  Laterality: N/A;   EXTRACORPOREAL SHOCK WAVE LITHOTRIPSY  2010   INSERTION OF MESH N/A 01/22/2021   Procedure: INSERTION OF MESH;  Surgeon: Jovita Kussmaul, MD;  Location: [place];  Service: General;  Laterality: N/A;   JOINT REPLACEMENT     MASS EXCISION  08/20/2012   Procedure: EXCISION MASS;  Surgeon: Gayland Curry, MD,FACS;  Location: [place];  Service: General;  Laterality: N/A;   TUBAL LIGATION  1976   VENTRAL HERNIA REPAIR N/A 01/22/2021   Procedure: LAPAROSCOPIC VENTRAL HERNIA REPAIR WITH MESH;  Surgeon: Jovita Kussmaul, MD;  Location: [place];  Service: General;  Laterality: N/A;     Social History   Tobacco Use  Smoking Status Former   Packs/day: 0.75   Years: 50.00   Pack years: 37.50   Types: Cigarettes   Start date: 1965   Quit date: 06/2014   Years since quitting: 7.0  Smokeless Tobacco Never    Social History   Substance and Sexual Activity  Alcohol Use Yes   Comment: occasionally     Family History  Problem Relation Age of Onset   Heart attack Father        79  deceased   Alcohol abuse  Father    Breast cancer Sister    Gallbladder disease Sister    Bipolar disorder Mother    Cancer Maternal Grandmother        colon     Current Outpatient Medications on File Prior to Visit  Medication Sig Dispense Refill   aspirin-acetaminophen-caffeine (EXCEDRIN MIGRAINE) 250-250-65 MG tablet Take 1 tablet by mouth every 6 (six) hours as needed for headache.     beta carotene w/minerals (OCUVITE) tablet Take 1 tablet by mouth daily.     betamethasone dipropionate 0.05 % cream Apply 1 application topically daily as needed (skin bumps).     Calcium Carbonate-Simethicone (ALKA-SELTZER HEARTBURN + GAS) 750-80 MG CHEW Chew 2 tablets by mouth daily as needed (cramps).      Cholecalciferol (VITAMIN D) 50 MCG (2000 UT) tablet Take 2,000 Units by mouth daily.     fluticasone (FLONASE) 50 MCG/ACT nasal spray Place 1 spray into both nostrils daily as needed for rhinitis.     HYDROcodone-acetaminophen (NORCO/VICODIN) 5-325 MG tablet Take 1-2 tablets by mouth every 6 (six) hours as needed for moderate pain or severe pain. 20 tablet 0   HYDROcodone-acetaminophen (NORCO/VICODIN) 5-325 MG tablet Take 1-2 tablets by mouth every 6 (six) hours as needed for moderate pain or severe pain. 20 tablet 0   levothyroxine (SYNTHROID) 112 MCG tablet Take 1 tablet (112 mcg total) by mouth daily. **Need appt before anymore refills given** 90 tablet 0   Melatonin 10 MG TABS Take 10 mg by mouth at bedtime as needed (sleep).     metFORMIN (GLUCOPHAGE-XR) 500 MG 24 hr tablet Take 500 mg by mouth daily.     Multiple Vitamin (MULTIVITAMIN WITH MINERALS) TABS Take 1 tablet by mouth daily.     naproxen sodium (ALEVE) 220 MG tablet Take 220-440 mg by mouth 2 (two) times daily as needed (pain).     Pentosan Polysulfate Sodium 150 MG CPDR Take 150 mg by mouth daily.     Semaglutide,0.25 or 0.5MG/DOS, (OZEMPIC, 0.25 OR 0.5 MG/DOSE,) 2 MG/1.5ML SOPN Inject 0.5 mg into the skin every Saturday.     senna (SENOKOT) 8.6 MG TABS tablet Take 8.6 mg by mouth daily as needed for mild constipation.     tolterodine (DETROL LA) 4 MG 24 hr capsule Take 4 mg by mouth daily.  11   No current facility-administered medications on file prior to visit.    Cardiovascular and other pertinent studies:  EKG 06/27/2021: Sinus rhythm 98 bpm  Low voltage with rightward P-axis and rotation -possible pulmonary disease    Lung cancer screening CT Chest 04/17/2021: 1. Lung-RADS 2, benign appearance or behavior. Continue annual screening with low-dose chest CT without contrast in 12 months. 2. Left adrenal nodule, previously characterized as a myelolipoma. 3.  Aortic atherosclerosis (ICD10-I70.0). 4.  Emphysema  (ICD10-J43.9).    Recent labs: 04/19/2021: Glucose 95, BUN/Cr 19/1.03. EGFR 58. Na/K 138/4.4. Rest of the CMP normal H/H 15/45. MCV 83. Platelets 251 HbA1C 5.6% Chol 165, TG 96, HDL 49, LDL 97 TSH 1.4 normal    Review of Systems  Cardiovascular:  Positive for dyspnea on exertion. Negative for chest pain, leg swelling, palpitations and syncope.        Vitals:   06/27/21 1001  BP: 108/78  Pulse: (!) 105  Resp: 16  Temp: 98.4 F (36.9 C)  SpO2: 95%     Body mass index is 44.26 kg/m. Filed Weights   06/27/21 1001  Weight: 242 lb (109.8 kg)  Objective:   Physical Exam Vitals and nursing note reviewed.  Constitutional:      General: She is not in acute distress. Neck:     Vascular: No JVD.  Cardiovascular:     Rate and Rhythm: Normal rate and regular rhythm.     Heart sounds: Normal heart sounds. No murmur heard. Pulmonary:     Effort: Pulmonary effort is normal.     Breath sounds: Normal breath sounds. No wheezing or rales.  Musculoskeletal:     Right lower leg: No edema.     Left lower leg: No edema.       Assessment & Recommendations:    70 y.o. Caucasian female with prior tobacco dependence, ocular disorder,h/o bladder neoplasm, only for 4 aortic atherosclerosis.  Aortic atherosclerosis: LDL 97.  Recommend Crestor 10 mg for risk mitigation.  No aspirin indicated at this time.  Exertional dyspnea: Longstanding history of tobacco dependence, with ongoing passive exposure to tobacco She has upcoming pulmonary evaluation.  If that is negative, will recommend echocardiogram.  I will see her on as-needed basis.   Thank you for referring the patient to [place]. Please feel free to contact with any questions.   Nigel Mormon, MD Pager: [phone] Office: [phone]

## 2021-07-19 ENCOUNTER — Other Ambulatory Visit: Payer: Self-pay | Admitting: Family Medicine

## 2021-07-19 DIAGNOSIS — E032 Hypothyroidism due to medicaments and other exogenous substances: Secondary | ICD-10-CM

## 2021-07-30 DIAGNOSIS — K5730 Diverticulosis of large intestine without perforation or abscess without bleeding: Secondary | ICD-10-CM | POA: Diagnosis not present

## 2021-07-30 DIAGNOSIS — Z1211 Encounter for screening for malignant neoplasm of colon: Secondary | ICD-10-CM | POA: Diagnosis not present

## 2021-07-30 DIAGNOSIS — Z86010 Personal history of colonic polyps: Secondary | ICD-10-CM | POA: Diagnosis not present

## 2021-08-01 DIAGNOSIS — I10 Essential (primary) hypertension: Secondary | ICD-10-CM | POA: Diagnosis not present

## 2021-08-01 DIAGNOSIS — E039 Hypothyroidism, unspecified: Secondary | ICD-10-CM | POA: Diagnosis not present

## 2021-08-01 DIAGNOSIS — Z23 Encounter for immunization: Secondary | ICD-10-CM | POA: Diagnosis not present

## 2021-08-01 DIAGNOSIS — Z79899 Other long term (current) drug therapy: Secondary | ICD-10-CM | POA: Diagnosis not present

## 2021-08-10 ENCOUNTER — Other Ambulatory Visit: Payer: Self-pay

## 2021-08-10 ENCOUNTER — Ambulatory Visit
Admission: RE | Admit: 2021-08-10 | Discharge: 2021-08-10 | Disposition: A | Discharge status: Home / Self Care | Payer: PPO | Source: Ambulatory Visit | Attending: Nurse Practitioner | Admitting: Nurse Practitioner

## 2021-08-10 DIAGNOSIS — Z1231 Encounter for screening mammogram for malignant neoplasm of breast: Secondary | ICD-10-CM | POA: Diagnosis not present

## 2021-08-10 DIAGNOSIS — I10 Essential (primary) hypertension: Secondary | ICD-10-CM | POA: Diagnosis not present

## 2021-09-24 DIAGNOSIS — R3915 Urgency of urination: Secondary | ICD-10-CM | POA: Diagnosis not present

## 2021-09-24 DIAGNOSIS — R350 Frequency of micturition: Secondary | ICD-10-CM | POA: Diagnosis not present

## 2021-09-24 DIAGNOSIS — M7918 Myalgia, other site: Secondary | ICD-10-CM | POA: Diagnosis not present

## 2021-09-24 DIAGNOSIS — N3010 Interstitial cystitis (chronic) without hematuria: Secondary | ICD-10-CM | POA: Diagnosis not present

## 2021-11-06 DIAGNOSIS — N3010 Interstitial cystitis (chronic) without hematuria: Secondary | ICD-10-CM | POA: Diagnosis not present

## 2021-11-06 DIAGNOSIS — N200 Calculus of kidney: Secondary | ICD-10-CM | POA: Diagnosis not present

## 2021-11-06 DIAGNOSIS — N136 Pyonephrosis: Secondary | ICD-10-CM | POA: Diagnosis not present

## 2021-11-06 DIAGNOSIS — N131 Hydronephrosis with ureteral stricture, not elsewhere classified: Secondary | ICD-10-CM | POA: Diagnosis not present

## 2021-11-06 DIAGNOSIS — I498 Other specified cardiac arrhythmias: Secondary | ICD-10-CM | POA: Diagnosis not present

## 2021-11-06 DIAGNOSIS — N181 Chronic kidney disease, stage 1: Secondary | ICD-10-CM | POA: Diagnosis not present

## 2021-11-06 DIAGNOSIS — Z452 Encounter for adjustment and management of vascular access device: Secondary | ICD-10-CM | POA: Diagnosis not present

## 2021-11-06 DIAGNOSIS — N189 Chronic kidney disease, unspecified: Secondary | ICD-10-CM | POA: Diagnosis not present

## 2021-11-06 DIAGNOSIS — Z87442 Personal history of urinary calculi: Secondary | ICD-10-CM | POA: Diagnosis not present

## 2021-11-06 DIAGNOSIS — R0789 Other chest pain: Secondary | ICD-10-CM | POA: Diagnosis not present

## 2021-11-06 DIAGNOSIS — N179 Acute kidney failure, unspecified: Secondary | ICD-10-CM | POA: Diagnosis not present

## 2021-11-06 DIAGNOSIS — N130 Hydronephrosis with ureteropelvic junction obstruction: Secondary | ICD-10-CM | POA: Diagnosis not present

## 2021-11-06 DIAGNOSIS — R109 Unspecified abdominal pain: Secondary | ICD-10-CM | POA: Diagnosis not present

## 2021-11-06 DIAGNOSIS — Z01818 Encounter for other preprocedural examination: Secondary | ICD-10-CM | POA: Diagnosis not present

## 2021-11-06 DIAGNOSIS — N138 Other obstructive and reflux uropathy: Secondary | ICD-10-CM | POA: Diagnosis not present

## 2021-11-06 DIAGNOSIS — E039 Hypothyroidism, unspecified: Secondary | ICD-10-CM | POA: Diagnosis not present

## 2021-11-06 DIAGNOSIS — D72829 Elevated white blood cell count, unspecified: Secondary | ICD-10-CM | POA: Diagnosis not present

## 2021-11-06 DIAGNOSIS — N1330 Unspecified hydronephrosis: Secondary | ICD-10-CM | POA: Diagnosis not present

## 2021-11-06 DIAGNOSIS — Z87891 Personal history of nicotine dependence: Secondary | ICD-10-CM | POA: Diagnosis not present

## 2021-11-06 DIAGNOSIS — Z8619 Personal history of other infectious and parasitic diseases: Secondary | ICD-10-CM | POA: Diagnosis not present

## 2021-11-06 DIAGNOSIS — R509 Fever, unspecified: Secondary | ICD-10-CM | POA: Diagnosis not present

## 2021-11-06 DIAGNOSIS — K760 Fatty (change of) liver, not elsewhere classified: Secondary | ICD-10-CM | POA: Diagnosis not present

## 2021-11-06 DIAGNOSIS — R140 Abdominal distension (gaseous): Secondary | ICD-10-CM | POA: Diagnosis not present

## 2021-11-06 DIAGNOSIS — N12 Tubulo-interstitial nephritis, not specified as acute or chronic: Secondary | ICD-10-CM | POA: Diagnosis not present

## 2021-11-06 DIAGNOSIS — Z20822 Contact with and (suspected) exposure to covid-19: Secondary | ICD-10-CM | POA: Diagnosis not present

## 2021-11-06 DIAGNOSIS — N132 Hydronephrosis with renal and ureteral calculous obstruction: Secondary | ICD-10-CM | POA: Diagnosis not present

## 2021-11-06 DIAGNOSIS — Z87440 Personal history of urinary (tract) infections: Secondary | ICD-10-CM | POA: Diagnosis not present

## 2021-11-06 DIAGNOSIS — B965 Pseudomonas (aeruginosa) (mallei) (pseudomallei) as the cause of diseases classified elsewhere: Secondary | ICD-10-CM | POA: Diagnosis not present

## 2021-11-06 DIAGNOSIS — J449 Chronic obstructive pulmonary disease, unspecified: Secondary | ICD-10-CM | POA: Diagnosis not present

## 2021-11-06 DIAGNOSIS — N390 Urinary tract infection, site not specified: Secondary | ICD-10-CM | POA: Diagnosis not present

## 2021-11-06 DIAGNOSIS — N201 Calculus of ureter: Secondary | ICD-10-CM | POA: Diagnosis not present

## 2021-11-06 DIAGNOSIS — Z1619 Resistance to other specified beta lactam antibiotics: Secondary | ICD-10-CM | POA: Diagnosis not present

## 2021-11-06 DIAGNOSIS — N3080 Other cystitis without hematuria: Secondary | ICD-10-CM | POA: Diagnosis not present

## 2021-11-06 DIAGNOSIS — R0602 Shortness of breath: Secondary | ICD-10-CM | POA: Diagnosis not present

## 2021-11-06 DIAGNOSIS — R1084 Generalized abdominal pain: Secondary | ICD-10-CM | POA: Diagnosis not present

## 2021-11-06 DIAGNOSIS — I444 Left anterior fascicular block: Secondary | ICD-10-CM | POA: Diagnosis not present

## 2021-11-06 DIAGNOSIS — Z1624 Resistance to multiple antibiotics: Secondary | ICD-10-CM | POA: Diagnosis not present

## 2021-11-06 DIAGNOSIS — Z7984 Long term (current) use of oral hypoglycemic drugs: Secondary | ICD-10-CM | POA: Diagnosis not present

## 2021-11-06 DIAGNOSIS — N2889 Other specified disorders of kidney and ureter: Secondary | ICD-10-CM | POA: Diagnosis not present

## 2021-11-06 DIAGNOSIS — E785 Hyperlipidemia, unspecified: Secondary | ICD-10-CM | POA: Diagnosis not present

## 2021-11-14 DIAGNOSIS — N132 Hydronephrosis with renal and ureteral calculous obstruction: Secondary | ICD-10-CM | POA: Diagnosis not present

## 2021-11-14 DIAGNOSIS — Z466 Encounter for fitting and adjustment of urinary device: Secondary | ICD-10-CM | POA: Diagnosis not present

## 2021-11-14 DIAGNOSIS — N200 Calculus of kidney: Secondary | ICD-10-CM | POA: Diagnosis not present

## 2022-08-20 ENCOUNTER — Encounter: Payer: Self-pay | Admitting: Acute Care

## 2024-01-13 ENCOUNTER — Encounter: Payer: Self-pay | Admitting: Acute Care
